# Patient Record
Sex: Male | Born: 1965 | Race: White | Hispanic: No | Marital: Single | Smoking: Current every day smoker
Health system: Southern US, Community
[De-identification: ages and names within clinical notes are randomized; demographics above are authoritative.]

## PROBLEM LIST (undated history)

## (undated) ENCOUNTER — Emergency Department (HOSPITAL_COMMUNITY): Admission: EM | Payer: Medicare Other | Source: Home / Self Care

## (undated) DIAGNOSIS — M5136 Other intervertebral disc degeneration, lumbar region: Secondary | ICD-10-CM

## (undated) DIAGNOSIS — R569 Unspecified convulsions: Secondary | ICD-10-CM

## (undated) DIAGNOSIS — J45909 Unspecified asthma, uncomplicated: Secondary | ICD-10-CM

## (undated) DIAGNOSIS — R519 Headache, unspecified: Secondary | ICD-10-CM

## (undated) DIAGNOSIS — IMO0001 Reserved for inherently not codable concepts without codable children: Secondary | ICD-10-CM

## (undated) DIAGNOSIS — Z9889 Other specified postprocedural states: Secondary | ICD-10-CM

## (undated) DIAGNOSIS — J449 Chronic obstructive pulmonary disease, unspecified: Secondary | ICD-10-CM

## (undated) DIAGNOSIS — M1990 Unspecified osteoarthritis, unspecified site: Secondary | ICD-10-CM

## (undated) DIAGNOSIS — I10 Essential (primary) hypertension: Secondary | ICD-10-CM

## (undated) DIAGNOSIS — T8859XA Other complications of anesthesia, initial encounter: Secondary | ICD-10-CM

## (undated) DIAGNOSIS — J309 Allergic rhinitis, unspecified: Secondary | ICD-10-CM

## (undated) DIAGNOSIS — K219 Gastro-esophageal reflux disease without esophagitis: Secondary | ICD-10-CM

## (undated) DIAGNOSIS — G4733 Obstructive sleep apnea (adult) (pediatric): Secondary | ICD-10-CM

## (undated) DIAGNOSIS — E7800 Pure hypercholesterolemia, unspecified: Secondary | ICD-10-CM

## (undated) DIAGNOSIS — J189 Pneumonia, unspecified organism: Secondary | ICD-10-CM

## (undated) DIAGNOSIS — T4145XA Adverse effect of unspecified anesthetic, initial encounter: Secondary | ICD-10-CM

## (undated) DIAGNOSIS — I219 Acute myocardial infarction, unspecified: Secondary | ICD-10-CM

## (undated) DIAGNOSIS — C801 Malignant (primary) neoplasm, unspecified: Secondary | ICD-10-CM

## (undated) DIAGNOSIS — R112 Nausea with vomiting, unspecified: Secondary | ICD-10-CM

## (undated) DIAGNOSIS — M51369 Other intervertebral disc degeneration, lumbar region without mention of lumbar back pain or lower extremity pain: Secondary | ICD-10-CM

## (undated) HISTORY — PX: HERNIA REPAIR: SHX51

## (undated) HISTORY — PX: SHOULDER ARTHROSCOPY: SHX128

## (undated) HISTORY — DX: Headache, unspecified: R51.9

## (undated) HISTORY — DX: Obstructive sleep apnea (adult) (pediatric): G47.33

## (undated) HISTORY — DX: Unspecified asthma, uncomplicated: J45.909

## (undated) HISTORY — DX: Chronic obstructive pulmonary disease, unspecified: J44.9

## (undated) HISTORY — DX: Allergic rhinitis, unspecified: J30.9

---
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
## 1984-12-12 HISTORY — PX: TOTAL KNEE ARTHROPLASTY: SHX125

## 2000-05-01 ENCOUNTER — Emergency Department (HOSPITAL_COMMUNITY): Admission: EM | Admit: 2000-05-01 | Discharge: 2000-05-01 | Payer: Self-pay | Admitting: Emergency Medicine

## 2000-05-01 ENCOUNTER — Encounter: Payer: Self-pay | Admitting: *Deleted

## 2009-12-24 ENCOUNTER — Encounter: Admission: RE | Admit: 2009-12-24 | Discharge: 2009-12-24 | Payer: Self-pay | Admitting: Neurosurgery

## 2010-03-05 ENCOUNTER — Encounter: Admission: RE | Admit: 2010-03-05 | Discharge: 2010-03-05 | Payer: Self-pay | Admitting: Diagnostic Neuroimaging

## 2010-07-01 ENCOUNTER — Encounter: Admission: RE | Admit: 2010-07-01 | Discharge: 2010-07-01 | Payer: Self-pay | Admitting: Neurosurgery

## 2010-07-08 ENCOUNTER — Ambulatory Visit: Payer: Self-pay | Admitting: Pain Medicine

## 2010-07-14 ENCOUNTER — Ambulatory Visit: Payer: Self-pay | Admitting: Pain Medicine

## 2014-12-16 ENCOUNTER — Ambulatory Visit (HOSPITAL_COMMUNITY): Payer: Medicare Other | Attending: Emergency Medicine

## 2014-12-16 ENCOUNTER — Emergency Department (INDEPENDENT_AMBULATORY_CARE_PROVIDER_SITE_OTHER)
Admission: EM | Admit: 2014-12-16 | Discharge: 2014-12-16 | Disposition: A | Discharge status: Home / Self Care | Payer: Medicare Other | Source: Home / Self Care | Attending: Emergency Medicine | Admitting: Emergency Medicine

## 2014-12-16 ENCOUNTER — Encounter (HOSPITAL_COMMUNITY): Payer: Self-pay | Admitting: Emergency Medicine

## 2014-12-16 DIAGNOSIS — M25512 Pain in left shoulder: Secondary | ICD-10-CM | POA: Diagnosis not present

## 2014-12-16 DIAGNOSIS — S46012A Strain of muscle(s) and tendon(s) of the rotator cuff of left shoulder, initial encounter: Secondary | ICD-10-CM

## 2014-12-16 HISTORY — DX: Essential (primary) hypertension: I10

## 2014-12-16 HISTORY — DX: Pure hypercholesterolemia, unspecified: E78.00

## 2014-12-16 HISTORY — DX: Gastro-esophageal reflux disease without esophagitis: K21.9

## 2014-12-16 MED ORDER — MELOXICAM 15 MG PO TABS: 15.0000 mg | ORAL_TABLET | Freq: Every day | ORAL | Status: DC

## 2014-12-16 MED ORDER — HYDROCODONE-ACETAMINOPHEN 5-325 MG PO TABS
ORAL_TABLET | ORAL | Status: AC
  Filled 2014-12-16: qty 2

## 2014-12-16 MED ORDER — OXYCODONE-ACETAMINOPHEN 5-325 MG PO TABS: ORAL_TABLET | ORAL | Status: DC

## 2014-12-16 MED ORDER — HYDROCODONE-ACETAMINOPHEN 5-325 MG PO TABS
2.0000 | ORAL_TABLET | Freq: Once | ORAL | Status: AC
  Administered 2014-12-16: 2 via ORAL
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
## 2014-12-16 NOTE — ED Notes (Signed)
Patient transported to X-ray by shuttle with [facility] staff driving.  Pt.'s son going with him. Olivia Mackie in [place] notified that pt. is coming and to call when he is ready to come back.
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
## 2014-12-16 NOTE — Discharge Instructions (Signed)
Rotator Cuff Injury Rotator cuff injury is any type of injury to the set of muscles and tendons that make up the stabilizing unit of your shoulder. This unit holds the ball of your upper arm bone (humerus) in the socket of your shoulder blade (scapula).  CAUSES Injuries to your rotator cuff most commonly come from sports or activities that cause your arm to be moved repeatedly over your head. Examples of this include throwing, weight lifting, swimming, or racquet sports. Long lasting (chronic) irritation of your rotator cuff can cause soreness and swelling (inflammation), bursitis, and eventual damage to your tendons, such as a tear (rupture). SIGNS AND SYMPTOMS Acute rotator cuff tear:  Sudden tearing sensation followed by severe pain shooting from your upper shoulder down your arm toward your elbow.  Decreased range of motion of your shoulder because of pain and muscle spasm.  Severe pain.  Inability to raise your arm out to the side because of pain and loss of muscle power (large tears). Chronic rotator cuff tear:  Pain that usually is worse at night and Jakubek interfere with sleep.  Gradual weakness and decreased shoulder motion as the pain worsens.  Decreased range of motion. Rotator cuff tendinitis:  Deep ache in your shoulder and the outside upper arm over your shoulder.  Pain that comes on gradually and becomes worse when lifting your arm to the side or turning it inward. DIAGNOSIS Rotator cuff injury is diagnosed through a medical history, physical exam, and imaging exam. The medical history helps determine the type of rotator cuff injury. Your health care provider will look at your injured shoulder, feel the injured area, and ask you to move your shoulder in different positions. X-ray exams typically are done to rule out other causes of shoulder pain, such as fractures. MRI is the exam of choice for the most severe shoulder injuries because the images show muscles and tendons.    TREATMENT  Chronic tear:  Medicine for pain, such as acetaminophen or ibuprofen.  Physical therapy and range-of-motion exercises Monarch be helpful in maintaining shoulder function and strength.  Steroid injections into your shoulder joint.  Surgical repair of the rotator cuff if the injury does not heal with noninvasive treatment. Acute tear:  Anti-inflammatory medicines such as ibuprofen and naproxen to help reduce pain and swelling.  A sling to help support your arm and rest your rotator cuff muscles. Long-term use of a sling is not advised. It Bouley cause significant stiffening of the shoulder joint.  Surgery Azzarello be considered within a few weeks, especially in younger, active people, to return the shoulder to full function.  Indications for surgical treatment include the following:  Age younger than 60 years.  Rotator cuff tears that are complete.  Physical therapy, rest, and anti-inflammatory medicines have been used for 6-8 weeks, with no improvement.  Employment or sporting activity that requires constant shoulder use. Tendinitis:  Anti-inflammatory medicines such as ibuprofen and naproxen to help reduce pain and swelling.  A sling to help support your arm and rest your rotator cuff muscles. Long-term use of a sling is not advised. It Lawley cause significant stiffening of the shoulder joint.  Severe tendinitis Gaus require:  Steroid injections into your shoulder joint.  Physical therapy.  Surgery. HOME CARE INSTRUCTIONS   Apply ice to your injury:  Put ice in a plastic bag.  Place a towel between your skin and the bag.  Leave the ice on for 20 minutes, 2-3 times a day.  If you   have a shoulder immobilizer (sling and straps), wear it until told otherwise by your health care provider.  You Mccolm want to sleep on several pillows or in a recliner at night to lessen swelling and pain.  Only take over-the-counter or prescription medicines for pain, discomfort, or fever as  directed by your health care provider.  Do simple hand squeezing exercises with a soft rubber ball to decrease hand swelling. SEEK MEDICAL CARE IF:   Your shoulder pain increases, or new pain or numbness develops in your arm, hand, or fingers.  Your hand or fingers are colder than your other hand. SEEK IMMEDIATE MEDICAL CARE IF:   Your arm, hand, or fingers are numb or tingling.  Your arm, hand, or fingers are increasingly swollen and painful, or they turn white or blue. MAKE SURE YOU:  Understand these instructions.  Will watch your condition.  Will get help right away if you are not doing well or get worse. Document Released: 11/25/2000 Document Revised: 12/03/2013 Document Reviewed: 07/10/2013 ExitCare Patient Information 2015 [employer]. This information is not intended to replace advice given to you by your health care provider. Make sure you discuss any questions you have with your health care provider.  

## 2014-12-16 NOTE — ED Notes (Signed)
Return from xray and back in room 9.

## 2014-12-23 ENCOUNTER — Other Ambulatory Visit: Payer: Self-pay | Admitting: Orthopaedic Surgery

## 2014-12-23 DIAGNOSIS — M25512 Pain in left shoulder: Secondary | ICD-10-CM

## 2014-12-24 ENCOUNTER — Ambulatory Visit
Admission: RE | Admit: 2014-12-24 | Discharge: 2014-12-24 | Disposition: A | Discharge status: Home / Self Care | Payer: Medicare Other | Source: Ambulatory Visit | Attending: Orthopaedic Surgery | Admitting: Orthopaedic Surgery

## 2014-12-24 DIAGNOSIS — M25512 Pain in left shoulder: Secondary | ICD-10-CM

## 2015-02-19 ENCOUNTER — Other Ambulatory Visit (HOSPITAL_COMMUNITY): Payer: Self-pay | Admitting: Orthopaedic Surgery

## 2015-02-20 ENCOUNTER — Encounter (HOSPITAL_COMMUNITY): Payer: Self-pay | Admitting: *Deleted

## 2015-02-22 MED ORDER — CEFAZOLIN SODIUM-DEXTROSE 2-3 GM-% IV SOLR
2.0000 g | INTRAVENOUS | Status: AC
  Administered 2015-02-23: 2 g via INTRAVENOUS
  Filled 2015-02-22: qty 50

## 2015-02-23 ENCOUNTER — Ambulatory Visit (HOSPITAL_COMMUNITY): Payer: Medicare Other | Admitting: Certified Registered Nurse Anesthetist

## 2015-02-23 ENCOUNTER — Encounter (HOSPITAL_COMMUNITY)
Admission: RE | Disposition: A | Discharge status: Home / Self Care | Payer: Self-pay | Source: Ambulatory Visit | Attending: Orthopaedic Surgery

## 2015-02-23 ENCOUNTER — Other Ambulatory Visit: Payer: Self-pay

## 2015-02-23 ENCOUNTER — Encounter (HOSPITAL_COMMUNITY): Payer: Self-pay | Admitting: Certified Registered Nurse Anesthetist

## 2015-02-23 ENCOUNTER — Ambulatory Visit (HOSPITAL_COMMUNITY)
Admission: RE | Admit: 2015-02-23 | Discharge: 2015-02-23 | Disposition: A | Discharge status: Home / Self Care | Payer: Medicare Other | Source: Ambulatory Visit | Attending: Orthopaedic Surgery | Admitting: Orthopaedic Surgery

## 2015-02-23 DIAGNOSIS — M5136 Other intervertebral disc degeneration, lumbar region: Secondary | ICD-10-CM | POA: Insufficient documentation

## 2015-02-23 DIAGNOSIS — I10 Essential (primary) hypertension: Secondary | ICD-10-CM | POA: Insufficient documentation

## 2015-02-23 DIAGNOSIS — K219 Gastro-esophageal reflux disease without esophagitis: Secondary | ICD-10-CM | POA: Insufficient documentation

## 2015-02-23 DIAGNOSIS — F17210 Nicotine dependence, cigarettes, uncomplicated: Secondary | ICD-10-CM | POA: Insufficient documentation

## 2015-02-23 DIAGNOSIS — M75122 Complete rotator cuff tear or rupture of left shoulder, not specified as traumatic: Secondary | ICD-10-CM | POA: Insufficient documentation

## 2015-02-23 HISTORY — DX: Nausea with vomiting, unspecified: R11.2

## 2015-02-23 HISTORY — DX: Other intervertebral disc degeneration, lumbar region without mention of lumbar back pain or lower extremity pain: M51.369

## 2015-02-23 HISTORY — DX: Reserved for inherently not codable concepts without codable children: IMO0001

## 2015-02-23 HISTORY — DX: Unspecified convulsions: R56.9

## 2015-02-23 HISTORY — DX: Other specified postprocedural states: Z98.890

## 2015-02-23 HISTORY — DX: Other complications of anesthesia, initial encounter: T88.59XA

## 2015-02-23 HISTORY — DX: Unspecified osteoarthritis, unspecified site: M19.90

## 2015-02-23 HISTORY — DX: Adverse effect of unspecified anesthetic, initial encounter: T41.45XA

## 2015-02-23 HISTORY — PX: SHOULDER ARTHROSCOPY WITH ROTATOR CUFF REPAIR: SHX5685

## 2015-02-23 HISTORY — DX: Other intervertebral disc degeneration, lumbar region: M51.36

## 2015-02-23 LAB — PROTIME-INR
INR: 1.08 (ref 0.00–1.49)
Prothrombin Time: 14.1 seconds (ref 11.6–15.2)

## 2015-02-23 LAB — CBC
HCT: 45 % (ref 39.0–52.0)
Hemoglobin: 15.4 g/dL (ref 13.0–17.0)
MCH: 29.4 pg (ref 26.0–34.0)
MCHC: 34.2 g/dL (ref 30.0–36.0)
MCV: 86 fL (ref 78.0–100.0)
Platelets: 205 10*3/uL (ref 150–400)
RBC: 5.23 MIL/uL (ref 4.22–5.81)
RDW: 13.5 % (ref 11.5–15.5)
WBC: 7.8 10*3/uL (ref 4.0–10.5)

## 2015-02-23 LAB — COMPREHENSIVE METABOLIC PANEL
ALT: 29 U/L (ref 0–53)
ANION GAP: 9 (ref 5–15)
AST: 27 U/L (ref 0–37)
Albumin: 3.7 g/dL (ref 3.5–5.2)
Alkaline Phosphatase: 75 U/L (ref 39–117)
BUN: 25 mg/dL — AB (ref 6–23)
CALCIUM: 9.1 mg/dL (ref 8.4–10.5)
CO2: 23 mmol/L (ref 19–32)
CREATININE: 1.33 mg/dL (ref 0.50–1.35)
Chloride: 106 mmol/L (ref 96–112)
GFR calc Af Amer: 71 mL/min — ABNORMAL LOW (ref >90)
GFR calc non Af Amer: 61 mL/min — ABNORMAL LOW (ref >90)
GLUCOSE: 100 mg/dL — AB (ref 70–99)
POTASSIUM: 3.3 mmol/L — AB (ref 3.5–5.1)
Sodium: 138 mmol/L (ref 135–145)
TOTAL PROTEIN: 6.4 g/dL (ref 6.0–8.3)
Total Bilirubin: 0.8 mg/dL (ref 0.3–1.2)

## 2015-02-23 SURGERY — ARTHROSCOPY, SHOULDER, WITH ROTATOR CUFF REPAIR
Anesthesia: General | Laterality: Left

## 2015-02-23 MED ORDER — PHENYLEPHRINE 40 MCG/ML (10ML) SYRINGE FOR IV PUSH (FOR BLOOD PRESSURE SUPPORT)
PREFILLED_SYRINGE | INTRAVENOUS | Status: AC
  Filled 2015-02-23: qty 10

## 2015-02-23 MED ORDER — NEOSTIGMINE METHYLSULFATE 10 MG/10ML IV SOLN
INTRAVENOUS | Status: DC | PRN
  Administered 2015-02-23: 4 mg via INTRAVENOUS

## 2015-02-23 MED ORDER — ARTIFICIAL TEARS OP OINT
TOPICAL_OINTMENT | OPHTHALMIC | Status: AC
  Filled 2015-02-23: qty 3.5

## 2015-02-23 MED ORDER — PHENYLEPHRINE HCL 10 MG/ML IJ SOLN
10.0000 mg | INTRAVENOUS | Status: DC | PRN
  Administered 2015-02-23: 50 ug/min via INTRAVENOUS

## 2015-02-23 MED ORDER — FENTANYL CITRATE 0.05 MG/ML IJ SOLN
100.0000 ug | Freq: Once | INTRAMUSCULAR | Status: AC
  Administered 2015-02-23: 100 ug via INTRAVENOUS
  Filled 2015-02-23: qty 2

## 2015-02-23 MED ORDER — EPHEDRINE SULFATE 50 MG/ML IJ SOLN
INTRAMUSCULAR | Status: DC | PRN
  Administered 2015-02-23 (×3): 5 mg via INTRAVENOUS

## 2015-02-23 MED ORDER — LIDOCAINE HCL (CARDIAC) 20 MG/ML IV SOLN
INTRAVENOUS | Status: DC | PRN
  Administered 2015-02-23: 100 mg via INTRAVENOUS

## 2015-02-23 MED ORDER — LACTATED RINGERS IV SOLN
INTRAVENOUS | Status: DC
  Administered 2015-02-23: 10:00:00 via INTRAVENOUS

## 2015-02-23 MED ORDER — DEXAMETHASONE SODIUM PHOSPHATE 4 MG/ML IJ SOLN
INTRAMUSCULAR | Status: AC
Start: 2015-02-23 — End: 2015-02-23
  Filled 2015-02-23: qty 2

## 2015-02-23 MED ORDER — MIDAZOLAM HCL 2 MG/2ML IJ SOLN
2.0000 mg | Freq: Once | INTRAMUSCULAR | Status: AC
  Administered 2015-02-23: 2 mg via INTRAVENOUS

## 2015-02-23 MED ORDER — OXYCODONE HCL 5 MG PO TABS: 5.0000 mg | ORAL_TABLET | Freq: Once | ORAL | Status: AC | PRN

## 2015-02-23 MED ORDER — BUPIVACAINE-EPINEPHRINE (PF) 0.25% -1:200000 IJ SOLN
INTRAMUSCULAR | Status: AC
  Filled 2015-02-23: qty 30

## 2015-02-23 MED ORDER — PROPOFOL 10 MG/ML IV BOLUS
INTRAVENOUS | Status: AC
  Filled 2015-02-23: qty 20

## 2015-02-23 MED ORDER — ARTIFICIAL TEARS OP OINT
TOPICAL_OINTMENT | OPHTHALMIC | Status: DC | PRN
  Administered 2015-02-23: 1 via OPHTHALMIC

## 2015-02-23 MED ORDER — ONDANSETRON HCL 4 MG/2ML IJ SOLN
INTRAMUSCULAR | Status: DC | PRN
  Administered 2015-02-23: 4 mg via INTRAVENOUS

## 2015-02-23 MED ORDER — METHOCARBAMOL 500 MG PO TABS: 500.0000 mg | ORAL_TABLET | Freq: Four times a day (QID) | ORAL | Status: DC | PRN

## 2015-02-23 MED ORDER — SODIUM CHLORIDE 0.9 % IR SOLN
Status: DC | PRN
  Administered 2015-02-23: 3000 mL

## 2015-02-23 MED ORDER — BUPIVACAINE-EPINEPHRINE (PF) 0.5% -1:200000 IJ SOLN
INTRAMUSCULAR | Status: DC | PRN
  Administered 2015-02-23: 30 mL via PERINEURAL

## 2015-02-23 MED ORDER — PHENYLEPHRINE HCL 10 MG/ML IJ SOLN
INTRAMUSCULAR | Status: DC | PRN
  Administered 2015-02-23 (×2): 120 ug via INTRAVENOUS
  Administered 2015-02-23: 160 ug via INTRAVENOUS

## 2015-02-23 MED ORDER — PROPOFOL 10 MG/ML IV BOLUS
INTRAVENOUS | Status: DC | PRN
  Administered 2015-02-23: 200 mg via INTRAVENOUS

## 2015-02-23 MED ORDER — LACTATED RINGERS IV SOLN
INTRAVENOUS | Status: DC | PRN
  Administered 2015-02-23 (×2): via INTRAVENOUS

## 2015-02-23 MED ORDER — SUCCINYLCHOLINE CHLORIDE 20 MG/ML IJ SOLN
INTRAMUSCULAR | Status: DC | PRN
  Administered 2015-02-23: 140 mg via INTRAVENOUS

## 2015-02-23 MED ORDER — HYDROMORPHONE HCL 1 MG/ML IJ SOLN
0.2500 mg | INTRAMUSCULAR | Status: DC | PRN
Start: 2015-02-23 — End: 2015-02-24

## 2015-02-23 MED ORDER — NEOSTIGMINE METHYLSULFATE 10 MG/10ML IV SOLN
INTRAVENOUS | Status: AC
  Filled 2015-02-23: qty 1

## 2015-02-23 MED ORDER — FENTANYL CITRATE 0.05 MG/ML IJ SOLN
INTRAMUSCULAR | Status: AC
  Filled 2015-02-23: qty 5

## 2015-02-23 MED ORDER — GLYCOPYRROLATE 0.2 MG/ML IJ SOLN
INTRAMUSCULAR | Status: DC | PRN
  Administered 2015-02-23: 0.6 mg via INTRAVENOUS

## 2015-02-23 MED ORDER — OXYCODONE HCL 5 MG/5ML PO SOLN: 5.0000 mg | Freq: Once | ORAL | Status: AC | PRN

## 2015-02-23 MED ORDER — OXYCODONE-ACETAMINOPHEN 7.5-325 MG PO TABS: 1.0000 | ORAL_TABLET | Freq: Four times a day (QID) | ORAL | Status: DC | PRN

## 2015-02-23 MED ORDER — MIDAZOLAM HCL 2 MG/2ML IJ SOLN
INTRAMUSCULAR | Status: AC
  Administered 2015-02-23: 2 mg via INTRAVENOUS
  Filled 2015-02-23: qty 2

## 2015-02-23 MED ORDER — ONDANSETRON HCL 4 MG/2ML IJ SOLN
INTRAMUSCULAR | Status: AC
  Filled 2015-02-23: qty 2

## 2015-02-23 MED ORDER — MIDAZOLAM HCL 2 MG/2ML IJ SOLN
INTRAMUSCULAR | Status: AC
  Filled 2015-02-23: qty 2

## 2015-02-23 MED ORDER — ROCURONIUM BROMIDE 100 MG/10ML IV SOLN
INTRAVENOUS | Status: DC | PRN
  Administered 2015-02-23: 40 mg via INTRAVENOUS

## 2015-02-23 MED ORDER — LIDOCAINE HCL (CARDIAC) 20 MG/ML IV SOLN
INTRAVENOUS | Status: AC
  Filled 2015-02-23: qty 5

## 2015-02-23 MED ORDER — ROCURONIUM BROMIDE 50 MG/5ML IV SOLN
INTRAVENOUS | Status: AC
  Filled 2015-02-23: qty 1

## 2015-02-23 MED ORDER — GLYCOPYRROLATE 0.2 MG/ML IJ SOLN
INTRAMUSCULAR | Status: AC
  Filled 2015-02-23: qty 2

## 2015-02-23 MED ORDER — PROMETHAZINE HCL 25 MG/ML IJ SOLN: 6.2500 mg | INTRAMUSCULAR | Status: DC | PRN

## 2015-02-23 MED ORDER — SUCCINYLCHOLINE CHLORIDE 20 MG/ML IJ SOLN
INTRAMUSCULAR | Status: AC
  Filled 2015-02-23: qty 1

## 2015-02-23 MED ORDER — BUPIVACAINE-EPINEPHRINE 0.25% -1:200000 IJ SOLN
INTRAMUSCULAR | Status: DC | PRN
  Administered 2015-02-23: 16 mL

## 2015-02-23 SURGICAL SUPPLY — 62 items
APL SKNCLS STERI-STRIP NONHPOA (GAUZE/BANDAGES/DRESSINGS) ×1
BENZOIN TINCTURE PRP APPL 2/3 (GAUZE/BANDAGES/DRESSINGS) ×2 IMPLANT
BLADE CUDA 5.5 (BLADE) IMPLANT
BLADE CUTTER GATOR 3.5 (BLADE) IMPLANT
BLADE GREAT WHITE 4.2 (BLADE) ×2 IMPLANT
BLADE SURG 11 STRL SS (BLADE) ×2 IMPLANT
BUR OVAL 6.0 (BURR) ×2 IMPLANT
CANNULA ACUFLEX KIT 5X76 (CANNULA) ×4 IMPLANT
COVER SURGICAL LIGHT HANDLE (MISCELLANEOUS) ×2 IMPLANT
DRAPE INCISE IOBAN 66X45 STRL (DRAPES) ×4 IMPLANT
DRAPE STERI 35X30 U-POUCH (DRAPES) ×2 IMPLANT
DRAPE U-SHAPE 47X51 STRL (DRAPES) ×4 IMPLANT
DRSG EMULSION OIL 3X3 NADH (GAUZE/BANDAGES/DRESSINGS) ×1 IMPLANT
DRSG PAD ABDOMINAL 8X10 ST (GAUZE/BANDAGES/DRESSINGS) ×6 IMPLANT
DURAPREP 26ML APPLICATOR (WOUND CARE) ×2 IMPLANT
ELECT MENISCUS 165MM 90D (ELECTRODE) IMPLANT
ELECT REM PT RETURN 9FT ADLT (ELECTROSURGICAL) ×2
ELECTRODE REM PT RTRN 9FT ADLT (ELECTROSURGICAL) ×1 IMPLANT
FILTER STRAW FLUID ASPIR (MISCELLANEOUS) ×1 IMPLANT
GAUZE SPONGE 4X4 12PLY STRL (GAUZE/BANDAGES/DRESSINGS) ×2 IMPLANT
GAUZE XEROFORM 1X8 LF (GAUZE/BANDAGES/DRESSINGS) ×2 IMPLANT
GAUZE XEROFORM 5X9 LF (GAUZE/BANDAGES/DRESSINGS) ×2 IMPLANT
GLOVE BIOGEL PI IND STRL 8 (GLOVE) ×1 IMPLANT
GLOVE BIOGEL PI INDICATOR 8 (GLOVE) ×1
GLOVE ORTHO TXT STRL SZ7.5 (GLOVE) ×2 IMPLANT
GOWN STRL REUS W/ TWL LRG LVL3 (GOWN DISPOSABLE) ×3 IMPLANT
GOWN STRL REUS W/TWL LRG LVL3 (GOWN DISPOSABLE) ×6
IMMOBILIZER SHOULDER FOAM XLGE (SOFTGOODS) ×2 IMPLANT
KIT BASIN OR (CUSTOM PROCEDURE TRAY) ×2 IMPLANT
KIT BIO-TENODESIS 3X8 DISP (MISCELLANEOUS) ×2
KIT INSRT BABSR STRL DISP BTN (MISCELLANEOUS) IMPLANT
KIT ROOM TURNOVER OR (KITS) ×2 IMPLANT
MANIFOLD NEPTUNE II (INSTRUMENTS) ×2 IMPLANT
NDL HYPO 25X1 1.5 SAFETY (NEEDLE) ×1 IMPLANT
NDL SUT 6 .5 CRC .975X.05 MAYO (NEEDLE) ×1 IMPLANT
NEEDLE HYPO 25X1 1.5 SAFETY (NEEDLE) ×2 IMPLANT
NEEDLE MAYO TAPER (NEEDLE) ×2
NEEDLE SPNL 18GX3.5 QUINCKE PK (NEEDLE) ×2 IMPLANT
NS IRRIG 1000ML POUR BTL (IV SOLUTION) ×2 IMPLANT
PACK SHOULDER (CUSTOM PROCEDURE TRAY) ×2 IMPLANT
PAD ARMBOARD 7.5X6 YLW CONV (MISCELLANEOUS) ×4 IMPLANT
SCREW TENODESIS BIOCOMP 7MM (Screw) ×1 IMPLANT
SET ARTHROSCOPY TUBING (MISCELLANEOUS) ×2
SET ARTHROSCOPY TUBING LN (MISCELLANEOUS) ×1 IMPLANT
SLING ARM IMMOBILIZER MED (SOFTGOODS) IMPLANT
SPONGE GAUZE 4X4 12PLY STER LF (GAUZE/BANDAGES/DRESSINGS) ×2 IMPLANT
SPONGE LAP 4X18 X RAY DECT (DISPOSABLE) ×4 IMPLANT
STRIP CLOSURE SKIN 1/2X4 (GAUZE/BANDAGES/DRESSINGS) ×2 IMPLANT
SUCTION FRAZIER TIP 10 FR DISP (SUCTIONS) ×2 IMPLANT
SUT ANCHOR ULTRAFIX RC (Orthopedic Implant) ×2 IMPLANT
SUT ETHILON 3 0 PS 1 (SUTURE) ×2 IMPLANT
SUT VIC AB 0 CT2 27 (SUTURE) ×4 IMPLANT
SUT VIC AB 2-0 CT1 27 (SUTURE) ×4
SUT VIC AB 2-0 CT1 TAPERPNT 27 (SUTURE) ×2 IMPLANT
SUT VICRYL 4-0 PS2 18IN ABS (SUTURE) ×4 IMPLANT
SYR 20CC LL (SYRINGE) ×4 IMPLANT
SYR 3ML LL SCALE MARK (SYRINGE) ×2 IMPLANT
SYR TB 1ML LUER SLIP (SYRINGE) ×2 IMPLANT
TOWEL OR 17X24 6PK STRL BLUE (TOWEL DISPOSABLE) ×2 IMPLANT
TOWEL OR 17X26 10 PK STRL BLUE (TOWEL DISPOSABLE) ×2 IMPLANT
WAND HAND CNTRL MULTIVAC 90 (MISCELLANEOUS) IMPLANT
WATER STERILE IRR 1000ML POUR (IV SOLUTION) ×2 IMPLANT

## 2015-02-23 NOTE — Brief Op Note (Signed)
02/23/2015  1:01 PM  PATIENT:  Lawrence Medina  48 y.o. male  PRE-OPERATIVE DIAGNOSIS:  Rotator Cuff Tear Left Shoulder  POST-OPERATIVE DIAGNOSIS:  Rotator Cuff Tear Left Shoulder  PROCEDURE:  Procedure(s): SHOULDER ARTHROSCOPY WITH ROTATOR CUFF REPAIR (Left) and biceps tenodesis  SURGEON:  Surgeon(s) and Role:    * Marybelle Killings, MD - Primary  PHYSICIAN ASSISTANT:   ASSISTANTS: none   ANESTHESIA:   general  EBL:  Total I/O In: 1000 [I.V.:1000] Out: -   BLOOD ADMINISTERED:none  DRAINS: none  SPECIMEN:  No Specimen  DISPOSITION OF SPECIMEN:  N/A  COUNTS:  YES  TOURNIQUET:  * No tourniquets in log *   PATIENT DISPOSITION:  PACU - hemodynamically stable.

## 2015-02-23 NOTE — Anesthesia Preprocedure Evaluation (Addendum)
Anesthesia Evaluation  Patient identified by MRN, date of birth, ID band Patient awake    History of Anesthesia Complications (+) PONV  Airway Mallampati: III   Neck ROM: Limited  Mouth opening: Limited Mouth Opening  Dental  (+) Teeth Intact   Pulmonary shortness of breath, Current Smoker,  breath sounds clear to auscultation  + decreased breath sounds      Cardiovascular hypertension, Rhythm:Regular Rate:Normal     Neuro/Psych Seizures -,     GI/Hepatic GERD-  ,  Endo/Other  Morbid obesity  Renal/GU      Musculoskeletal  (+) Arthritis -,   Abdominal (+) + obese,   Peds  Hematology   Anesthesia Other Findings   Reproductive/Obstetrics                            Anesthesia Physical Anesthesia Plan  ASA: III  Anesthesia Plan: General   Post-op Pain Management:    Induction: Intravenous  Airway Management Planned: Oral ETT and Video Laryngoscope Planned  Additional Equipment:   Intra-op Plan:   Post-operative Plan: Extubation in OR  Informed Consent: I have reviewed the patients History and Physical, chart, labs and discussed the procedure including the risks, benefits and alternatives for the proposed anesthesia with the patient or authorized representative who has indicated his/her understanding and acceptance.   Dental advisory given  Plan Discussed with:   Anesthesia Plan Comments:        Anesthesia Quick Evaluation

## 2015-02-23 NOTE — Transfer of Care (Signed)
Immediate Anesthesia Transfer of Care Note  Patient: Lawrence Medina  Procedure(s) Performed: Procedure(s): SHOULDER ARTHROSCOPY WITH ROTATOR CUFF REPAIR BIOTENDONESIS REPAIR (Left)  Patient Location: PACU  Anesthesia Type:General and Regional  Level of Consciousness: awake, alert , oriented, patient cooperative and responds to stimulation  Airway & Oxygen Therapy: Patient Spontanous Breathing and Patient connected to nasal cannula oxygen  Post-op Assessment: Report given to RN and Post -op Vital signs reviewed and stable  Post vital signs: Reviewed and stable  Last Vitals:  Filed Vitals:   02/23/15 0841  BP: 144/83  Pulse: 92  Temp: 37.2 C  Resp: 18    Complications: No apparent anesthesia complications

## 2015-02-23 NOTE — Interval H&P Note (Signed)
History and Physical Interval Note:  02/23/2015 11:00 AM  Lawrence Medina  has presented today for surgery, with the diagnosis of Rotator Cuff Tear Left Shoulder  The various methods of treatment have been discussed with the patient and family. After consideration of risks, benefits and other options for treatment, the patient has consented to  Procedure(s): SHOULDER ARTHROSCOPY WITH ROTATOR CUFF REPAIR (Left) as a surgical intervention .  The patient's history has been reviewed, patient examined, no change in status, stable for surgery.  I have reviewed the patient's chart and labs.  Questions were answered to the patient's satisfaction.     Avangeline Stockburger C

## 2015-02-23 NOTE — Anesthesia Procedure Notes (Signed)
Anesthesia Regional Block:  Interscalene brachial plexus block  Pre-Anesthetic Checklist: ,, timeout performed, Correct Patient, Correct Site, Correct Laterality, Correct Procedure, Correct Position, site marked, Risks and benefits discussed, Surgical consent,  At surgeon's request and post-op pain management  Laterality: Upper and Left  Prep: chloraprep and alcohol swabs       Needles:  Injection technique: Single-shot  Needle Type: Echogenic Needle     Needle Length: 9cm 9 cm Needle Gauge: 21 and 21 G  Needle insertion depth: 4 cm   Additional Needles:  Procedures: ultrasound guided (picture in chart) and nerve stimulator Interscalene brachial plexus block  Nerve Stimulator or Paresthesia:  Response: Twitch elicited, 0.5 mA, 0.3 ms,   Additional Responses:   Narrative:  Start time: 02/23/2015 11:05 AM End time: 02/23/2015 11:20 AM Injection made incrementally with aspirations every 5 mL.  Performed by: Personally  Anesthesiologist: Sakeena Teall  Additional Notes: Block assessed prior to start of surgery

## 2015-02-23 NOTE — Anesthesia Postprocedure Evaluation (Signed)
2 Anesthesia Post-op Note  Patient: Lawrence Medina  Procedure(s) Performed: Procedure(s): SHOULDER ARTHROSCOPY WITH ROTATOR CUFF REPAIR BIOTENDONESIS REPAIR (Left)  Patient Location: PACU  Anesthesia Type:GA combined with regional for post-op pain  Level of Consciousness: awake and alert   Airway and Oxygen Therapy: Patient Spontanous Breathing  Post-op Pain: none  Post-op Assessment: Post-op Vital signs reviewed  Post-op Vital Signs: stable  Last Vitals:  Filed Vitals:   02/23/15 1414  BP:   Pulse:   Temp: 36 C  Resp:     Complications: No apparent anesthesia complications

## 2015-02-23 NOTE — Discharge Instructions (Signed)
°What to eat: ° °For your first meals, you should eat lightly; only small meals initially.  If you do not have nausea, you Boike eat larger meals.  Avoid spicy, greasy and heavy food.   ° °General Anesthesia, Adult, Care After  °Refer to this sheet in the next few weeks. These instructions provide you with information on caring for yourself after your procedure. Your health care provider Kreisler also give you more specific instructions. Your treatment has been planned according to current medical practices, but problems sometimes occur. Call your health care provider if you have any problems or questions after your procedure.  °WHAT TO EXPECT AFTER THE PROCEDURE  °After the procedure, it is typical to experience:  °Sleepiness.  °Nausea and vomiting. °HOME CARE INSTRUCTIONS  °For the first 24 hours after general anesthesia:  °Have a responsible person with you.  °Do not drive a car. If you are alone, do not take public transportation.  °Do not drink alcohol.  °Do not take medicine that has not been prescribed by your health care provider.  °Do not sign important papers or make important decisions.  °You Hopwood resume a normal diet and activities as directed by your health care provider.  °Change bandages (dressings) as directed.  °If you have questions or problems that seem related to general anesthesia, call the hospital and ask for the anesthetist or anesthesiologist on call. °SEEK MEDICAL CARE IF:  °You have nausea and vomiting that continue the day after anesthesia.  °You develop a rash. °SEEK IMMEDIATE MEDICAL CARE IF:  °You have difficulty breathing.  °You have chest pain.  °You have any allergic problems. °Document Released: 03/06/2001 Document Revised: 07/31/2013 Document Reviewed: 06/13/2013  °ExitCare® Patient Information ©2014 [employer].  ° °Sore Throat  ° ° °A sore throat is a painful, burning, sore, or scratchy feeling of the throat. There Kimmey be pain or tenderness when swallowing or talking. You Vohra have  other symptoms with a sore throat. These include coughing, sneezing, fever, or a swollen neck. A sore throat is often the first sign of another sickness. These sicknesses Bhat include a cold, flu, strep throat, or an infection called mono. Most sore throats go away without medical treatment.  °HOME CARE  °Only take medicine as told by your doctor.  °Drink enough fluids to keep your pee (urine) clear or pale yellow.  °Rest as needed.  °Try using throat sprays, lozenges, or suck on hard candy (if older than 4 years or as told).  °Sip warm liquids, such as broth, herbal tea, or warm water with honey. Try sucking on frozen ice pops or drinking cold liquids.  °Rinse the mouth (gargle) with salt water. Mix 1 teaspoon salt with 8 ounces of water.  °Do not smoke. Avoid being around others when they are smoking.  °Put a humidifier in your bedroom at night to moisten the air. You can also turn on a hot shower and sit in the bathroom for 5-10 minutes. Be sure the bathroom door is closed. °GET HELP RIGHT AWAY IF:  °You have trouble breathing.  °You cannot swallow fluids, soft foods, or your spit (saliva).  °You have more puffiness (swelling) in the throat.  °Your sore throat does not get better in 7 days.  °You feel sick to your stomach (nauseous) and throw up (vomit).  °You have a fever or lasting symptoms for more than 2-3 days.  °You have a fever and your symptoms suddenly get worse. °MAKE SURE YOU:  °Understand these   instructions.  °Will watch your condition.  °Will get help right away if you are not doing well or get worse. °Document Released: 09/06/2008 Document Revised: 08/22/2012 Document Reviewed: 08/05/2012  °ExitCare® Patient Information ©2015 [employer]. This information is not intended to replace advice given to you by your health care provider. Make sure you discuss any questions you have with your health care provider.  ° ° ° °
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
## 2015-02-24 ENCOUNTER — Encounter (HOSPITAL_COMMUNITY): Payer: Self-pay | Admitting: Orthopaedic Surgery

## 2015-02-24 NOTE — Op Note (Signed)
NAME:  Lawrence Medina, Lawrence Medina NO.:  [id number]  MEDICAL RECORD NO.:  [id number]  LOCATION:  [facility]                         FACILITY:  [facility]  PHYSICIAN:  Lawrence Medina, M.D.    DATE OF BIRTH:  1966-07-14  DATE OF PROCEDURE:  02/23/2015 DATE OF DISCHARGE:                              OPERATIVE REPORT   PREOPERATIVE DIAGNOSIS:  Left shoulder rotator cuff tear.  POSTOPERATIVE DIAGNOSIS:  Type 2 superior labral tear and full thickness rotator cuff tear involving the supraspinatus, minimally retracted.  PROCEDURE:  Diagnostic operative arthroscopy, left shoulder labral debridement, rotator cuff debridement, biceps tendon debridement and release.  Open biceps tenodesis in the bicipital groove with 7 x 23 Bio- Tenodesis screw.  Full-thickness rotator cuff repair, UltraFix anchors x2.  SURGEON:  Lawrence Medina, M.D.  ANESTHESIA:  General with preoperative block and 10 mL Marcaine 6.97% local.  COMPLICATIONS:  None.  ESTIMATED BLOOD LOSS:  Minimal.  DESCRIPTION OF PROCEDURE:  After induction of preoperative block anesthesia with intubation with underlying GlideScope by Anesthesia due to his thick short neck.  Once the tube is secured, standard positioning with beach chair on the shoulder frame, 10/15 drape DuraPrep, Ancef prophylaxis, time-out procedure, split sheets, drapes, impervious stockinette, Coban, and arthroscopic pouch were applied __________ placed through the scope, posterior approach.  Anterior port was made inferior to the biceps tendon.  Anterior inferior labrum was intact.  No Hill-Sachs lesion.  No Bankart lesion.  Articular surface looked normal. Superior labral tear from 11 to 2 o'clock position, lifting up the labrum down to surface of torn tissue where it had been attached to the glenoid and was visualized.  There was tendinopathy at the anchor of the biceps as it attached to the labrum and this was divided with straight flat baskets and a 4.2 Cuda  shaver.  Labrum was debrided.  Under surface of the rotator cuff was debrided.  __________ biceps tendon, there was supraspinatus full-thickness tear.  Under surface of the rotator cuff was debrided.  Shoulder was suctioned dry.  Incision was made along the anterior aspect of the acromion.  The patient had type 3 large spur greater than a centimeter and this was removed with 3/4 straight osteotome and rongeur.  Distal aspect of the clavicle was not prominent. Subacromial bursectomy was performed.  Full-thickness tear was visualized.  Biceps was hooked with elbow flexed, shoulder flexed with right angle.  Biceps tendon was resected 22 mm and then #2 FiberWire was placed __________ way through the tendon.  Hole drilled on the bicipital groove, reamed to 7 mm, and then insertion of the tendon using the long handle of the screwdriver attachment into the hole with the screw following it and then tying the 2 ends locking it in.  Tendon went into the hole through the bone and the screw was countersunk.  Next, attention was turned to the rotator cuff, cut back to 3 mm  __________ bleeding cuff.  Bone was rongeured and then 2 UltraFix anchors were placed and then sutured back through the rotator cuff supraspinatus tendon, pulling it directly down to the freshened up bony surface. Shoulder was rotated.  No further tear.  Undersurface of the acromion was smoothed.  Deltoid was repaired with the UltraFix needles back through holes in the bone with the deltoid 2-0 Vicryl in the subcutaneous tissue, 4-0 Vicryl subcuticular closure, tincture of benzoin, Steri- Strips, postop dressing, and a shoulder immobilizer.  Outpatient surgery for appropriate treatment of this condition.  Office followup in 1 week.     Lawrence Medina, M.D.     MCY/MEDQ  D:  02/23/2015  T:  02/24/2015  Job:  [id number]

## 2017-06-02 ENCOUNTER — Telehealth (INDEPENDENT_AMBULATORY_CARE_PROVIDER_SITE_OTHER): Payer: Self-pay | Admitting: Orthopaedic Surgery

## 2017-06-02 NOTE — Telephone Encounter (Signed)
Atty Alphonse Guild calling to check the status of his request to meet with Dr Lorin Mercy in person or vis phone, please advise

## 2017-06-05 NOTE — Telephone Encounter (Signed)
I'm putting email in your office regarding this note.

## 2017-06-05 NOTE — Telephone Encounter (Signed)
Would you like him to call prior to clinic on Tuesday or Friday afternoon?

## 2017-06-05 NOTE — Telephone Encounter (Signed)
OK either . Thanks .

## 2017-06-06 NOTE — Telephone Encounter (Signed)
Friday after noon OK .

## 2017-06-06 NOTE — Telephone Encounter (Signed)
Can you please let attorney know?

## 2017-06-07 NOTE — Telephone Encounter (Signed)
Fyi.

## 2017-06-07 NOTE — Telephone Encounter (Signed)
Phone Conference scheduled for 06/09/17 at 1:15 pm for Dr. Lorin Mercy with [place] at [facility].

## 2017-06-09 NOTE — Telephone Encounter (Signed)
See below

## 2017-06-12 NOTE — Telephone Encounter (Signed)
Conference call done less than one hour.

## 2017-06-27 ENCOUNTER — Emergency Department (HOSPITAL_BASED_OUTPATIENT_CLINIC_OR_DEPARTMENT_OTHER): Admit: 2017-06-27 | Discharge: 2017-06-27 | Disposition: A | Discharge status: Home / Self Care | Payer: Medicare Other

## 2017-06-27 ENCOUNTER — Encounter (HOSPITAL_COMMUNITY): Payer: Self-pay

## 2017-06-27 ENCOUNTER — Other Ambulatory Visit: Payer: Self-pay

## 2017-06-27 ENCOUNTER — Emergency Department (HOSPITAL_COMMUNITY): Payer: Medicare Other

## 2017-06-27 ENCOUNTER — Emergency Department (HOSPITAL_COMMUNITY)
Admission: EM | Admit: 2017-06-27 | Discharge: 2017-06-27 | Disposition: A | Discharge status: Home / Self Care | Payer: Medicare Other | Attending: Emergency Medicine | Admitting: Emergency Medicine

## 2017-06-27 DIAGNOSIS — F17210 Nicotine dependence, cigarettes, uncomplicated: Secondary | ICD-10-CM | POA: Insufficient documentation

## 2017-06-27 DIAGNOSIS — I714 Abdominal aortic aneurysm, without rupture, unspecified: Secondary | ICD-10-CM

## 2017-06-27 DIAGNOSIS — M7989 Other specified soft tissue disorders: Secondary | ICD-10-CM

## 2017-06-27 DIAGNOSIS — I10 Essential (primary) hypertension: Secondary | ICD-10-CM | POA: Diagnosis not present

## 2017-06-27 DIAGNOSIS — R079 Chest pain, unspecified: Secondary | ICD-10-CM | POA: Diagnosis present

## 2017-06-27 DIAGNOSIS — R0789 Other chest pain: Secondary | ICD-10-CM

## 2017-06-27 DIAGNOSIS — Z79899 Other long term (current) drug therapy: Secondary | ICD-10-CM | POA: Insufficient documentation

## 2017-06-27 LAB — CK: Total CK: 163 U/L (ref 49–397)

## 2017-06-27 LAB — BASIC METABOLIC PANEL
Anion gap: 7 (ref 5–15)
BUN: 14 mg/dL (ref 6–20)
CO2: 23 mmol/L (ref 22–32)
Calcium: 9.1 mg/dL (ref 8.9–10.3)
Chloride: 106 mmol/L (ref 101–111)
Creatinine, Ser: 1.02 mg/dL (ref 0.61–1.24)
GFR calc Af Amer: >60 mL/min (ref >60)
GFR calc non Af Amer: >60 mL/min (ref >60)
GLUCOSE: 94 mg/dL (ref 65–99)
Potassium: 3.9 mmol/L (ref 3.5–5.1)
Sodium: 136 mmol/L (ref 135–145)

## 2017-06-27 LAB — URINALYSIS, ROUTINE W REFLEX MICROSCOPIC
Bilirubin Urine: NEGATIVE
GLUCOSE, UA: NEGATIVE mg/dL
Ketones, ur: 5 mg/dL — AB
LEUKOCYTES UA: NEGATIVE
NITRITE: NEGATIVE
PH: 5 (ref 5.0–8.0)
Protein, ur: NEGATIVE mg/dL
SPECIFIC GRAVITY, URINE: 1.027 (ref 1.005–1.030)

## 2017-06-27 LAB — LIPASE, BLOOD: Lipase: 37 U/L (ref 11–51)

## 2017-06-27 LAB — CBC
HEMATOCRIT: 47.5 % (ref 39.0–52.0)
Hemoglobin: 16.5 g/dL (ref 13.0–17.0)
MCH: 30 pg (ref 26.0–34.0)
MCHC: 34.7 g/dL (ref 30.0–36.0)
MCV: 86.4 fL (ref 78.0–100.0)
Platelets: 213 10*3/uL (ref 150–400)
RBC: 5.5 MIL/uL (ref 4.22–5.81)
RDW: 14.3 % (ref 11.5–15.5)
WBC: 8.1 10*3/uL (ref 4.0–10.5)

## 2017-06-27 LAB — I-STAT TROPONIN, ED
TROPONIN I, POC: 0.06 ng/mL (ref 0.00–0.08)
Troponin i, poc: 0.02 ng/mL (ref 0.00–0.08)
Troponin i, poc: 0 ng/mL (ref 0.00–0.08)

## 2017-06-27 LAB — HEPATIC FUNCTION PANEL
ALT: 21 U/L (ref 17–63)
AST: 23 U/L (ref 15–41)
Albumin: 3.8 g/dL (ref 3.5–5.0)
Alkaline Phosphatase: 98 U/L (ref 38–126)
Bilirubin, Direct: 0.2 mg/dL (ref 0.1–0.5)
Indirect Bilirubin: 0.6 mg/dL (ref 0.3–0.9)
Total Bilirubin: 0.8 mg/dL (ref 0.3–1.2)
Total Protein: 6.6 g/dL (ref 6.5–8.1)

## 2017-06-27 MED ORDER — METOPROLOL TARTRATE 5 MG/5ML IV SOLN
5.0000 mg | Freq: Once | INTRAVENOUS | Status: AC
  Administered 2017-06-27: 5 mg via INTRAVENOUS
  Filled 2017-06-27: qty 5

## 2017-06-27 MED ORDER — IOPAMIDOL (ISOVUE-370) INJECTION 76%
INTRAVENOUS | Status: AC
  Administered 2017-06-27: 100 mL
  Filled 2017-06-27: qty 100

## 2017-06-27 MED ORDER — AMLODIPINE BESYLATE 10 MG PO TABS: 10.0000 mg | ORAL_TABLET | Freq: Every day | ORAL | 0 refills | Status: DC

## 2017-06-27 NOTE — ED Provider Notes (Signed)
[facility] DEPT Provider Note   CSN: [id number] Arrival date & time: 06/27/17  1339     History   Chief Complaint Chief Complaint  Patient presents with  . Chest Pain    HPI Lawrence Medina is a 51 y.o. male history of reflux, hypertension, here presenting with chest pain, shortness of breath, right flank pain. Patient has been having right flank pain for the last 2 weeks. Patient also has right groin pain yesterday. The last 2 days he noticed some substernal chest pain that is worse when he palpates. There is associated shortness of breath as well. Denies any radiation to the pain. Also since yesterday, he noticed some more tenderness on his left biceps muscle as well as left calf and some calf swelling. Patient was at the mall yesterday and had sudden onset R groin pain and had to stop and sit down. He went to see PCP today who wanted to call EMS to get him to the ER but he drove himself to the ED. He was noted to be hypertensive in triage and told me that he took lisinopril today. Denies recent travel, denies hx of CAD.   The history is provided by the patient.    Past Medical History:  Diagnosis Date  . Arthritis   . Complication of anesthesia   . DDD (degenerative disc disease), lumbar   . GERD (gastroesophageal reflux disease)   . High cholesterol   . Hypertension   . PONV (postoperative nausea and vomiting)   . Seizures ([place]) 1982   x1 in 1982- unknown cause- none since  . Shortness of breath dyspnea    with exertion    There are no active problems to display for this patient.   Past Surgical History:  Procedure Laterality Date  . HERNIA REPAIR    . SHOULDER ARTHROSCOPY WITH ROTATOR CUFF REPAIR Left 02/23/2015   Procedure: SHOULDER ARTHROSCOPY WITH ROTATOR CUFF REPAIR BIOTENDONESIS REPAIR;  Surgeon: Marybelle Killings, MD;  Location: [facility];  Service: Orthopedics;  Laterality: Left;  . SUBMANDIBULAR GLAND EXCISION Left 1975  . TOTAL KNEE ARTHROPLASTY Left 1986        Home Medications    Prior to Admission medications   Medication Sig Start Date End Date Taking? Authorizing Provider  amLODipine (NORVASC) 5 MG tablet Take 5 mg by mouth daily. 04/10/17  Yes [provider]  clonazePAM (KLONOPIN) 1 MG tablet Take 1 mg by mouth at bedtime as needed. 05/22/17  Yes [provider]  lisinopril-hydrochlorothiazide (PRINZIDE,ZESTORETIC) 20-25 MG tablet Take 1 tablet by mouth daily. 04/10/17  Yes [provider]  lovastatin (MEVACOR) 20 MG tablet Take 20 mg by mouth daily.   Yes [provider]  omeprazole (PRILOSEC) 40 MG capsule Take 40 mg by mouth daily.   Yes [provider]  meloxicam (MOBIC) 15 MG tablet Take 1 tablet (15 mg total) by mouth daily. Patient not taking: Reported on 06/27/2017 12/16/14   Harden Mo, MD  methocarbamol (ROBAXIN) 500 MG tablet Take 1 tablet (500 mg total) by mouth every 6 (six) hours as needed for muscle spasms. Patient not taking: Reported on 06/27/2017 02/23/15   Lanae Crumbly, PA-C  oxyCODONE-acetaminophen (PERCOCET) 7.5-325 MG per tablet Take 1 tablet by mouth every 6 (six) hours as needed for pain. Patient not taking: Reported on 06/27/2017 02/23/15   Lanae Crumbly, PA-C    Family History Family History  Problem Relation Age of Onset  . Hypertension Mother   .  Cancer Mother        skin  . Sleep apnea Mother   . Cancer Father        lung    Social History Social History  Substance Use Topics  . Smoking status: Current Every Day Smoker    Packs/day: 0.50    Years: 34.00    Types: Cigarettes  . Smokeless tobacco: Never Used  . Alcohol use Yes     Comment: very seldom     Allergies   Patient has no known allergies.   Review of Systems Review of Systems  Cardiovascular: Positive for chest pain.  Genitourinary:       R flank pain   All other systems reviewed and are negative.    Physical Exam Updated Vital Signs BP (!) 148/100   Pulse 80   Temp  98.1 F (36.7 C) (Oral)   Resp (!) 23   Ht 6' (1.829 m)   Wt (!) 140.6 kg (310 lb)   SpO2 95%   BMI 42.04 kg/m   Physical Exam  Constitutional: He is oriented to person, place, and time.  Uncomfortable   HENT:  Head: Normocephalic.  Mouth/Throat: Oropharynx is clear and moist.  Eyes: Pupils are equal, round, and reactive to light. EOM are normal.  Neck: Normal range of motion. Neck supple.  Cardiovascular: Normal rate, regular rhythm and normal heart sounds.   Pulmonary/Chest: Effort normal and breath sounds normal.  Reproducible sternal tenderness. Lungs clear   Abdominal: Soft. Bowel sounds are normal. He exhibits no distension. There is no tenderness.  + R CVAT. ? Small R inguinal hernia, reproducible, nontender   Genitourinary:  Genitourinary Comments: Testicles nontender.   Musculoskeletal: Normal range of motion.  + L calf tenderness, mild swelling. Mild L biceps tenderness no deformity   Neurological: He is alert and oriented to person, place, and time.  Skin: Skin is warm.  Psychiatric: He has a normal mood and affect.  Nursing note and vitals reviewed.    ED Treatments / Results  Labs (all labs ordered are listed, but only abnormal results are displayed) Labs Reviewed  URINALYSIS, ROUTINE W REFLEX MICROSCOPIC - Abnormal; Notable for the following:       Result Value   Hgb urine dipstick SMALL (*)    Ketones, ur 5 (*)    Bacteria, UA RARE (*)    Squamous Epithelial / LPF 0-5 (*)    All other components within normal limits  BASIC METABOLIC PANEL  CBC  HEPATIC FUNCTION PANEL  LIPASE, BLOOD  CK  I-STAT TROPOININ, ED  I-STAT TROPOININ, ED  I-STAT TROPOININ, ED    EKG  EKG Interpretation  Date/Time:  Tuesday June 27 2017 13:43:02 EDT Ventricular Rate:  88 PR Interval:  154 QRS Duration: 110 QT Interval:  376 QTC Calculation: 841 R Axis:   1 Text Interpretation:  Normal sinus rhythm Right bundle branch block Abnormal ECG No significant change since  last tracing Confirmed by Wandra Arthurs [id number]) on 06/27/2017 5:35:25 PM       Radiology Dg Chest 2 View  Result Date: 06/27/2017 CLINICAL DATA:  Chest pain extending into the left arm for several months. History of pneumothorax. Smoker. EXAM: CHEST  2 VIEW COMPARISON:  None. FINDINGS: The heart size and mediastinal contours are normal. There is mild central airway thickening without hyperinflation, confluent airspace opacity, pleural effusion or pneumothorax. Mild degenerative changes are present within the spine. There are postsurgical changes in the left humeral head. IMPRESSION: Mild  central airway thickening attributed to smoking. No acute cardiopulmonary process. Electronically Signed   By: Richardean Sale M.D.   On: 06/27/2017 14:59   Ct Angio Chest/abd/pel For Dissection W And/or Wo Contrast  Result Date: 06/27/2017 CLINICAL DATA:  Chest pain and abdominal pain. Assess for vascular dissection. EXAM: CT ANGIOGRAPHY CHEST, ABDOMEN AND PELVIS TECHNIQUE: Multidetector CT imaging through the chest, abdomen and pelvis was performed using the standard protocol during bolus administration of intravenous contrast. Multiplanar reconstructed images and MIPs were obtained and reviewed to evaluate the vascular anatomy. CONTRAST:  100 cc Isovue 370 COMPARISON:  Chest radiography same day FINDINGS: CTA CHEST FINDINGS Cardiovascular: Chest radiography same day heart size is normal. No pericardial fluid. No coronary artery calcification is seen. No evidence of aortic calcification. No aortic aneurysm or dissection. Brachiocephalic vessels appear normal. Pulmonary arteries appear normal. Mediastinum/Nodes: No mass or lymphadenopathy. Lungs/Pleura: Lungs are clear.  No pleural effusion. Musculoskeletal: Cervical spondylosis. No significant spinal finding in the thoracic region. Review of the MIP images confirms the above findings. CTA ABDOMEN AND PELVIS FINDINGS VASCULAR Aorta: Aortic atherosclerosis. 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 Maximal diameter is 2.9 cm. No dissection. Celiac: Widely patent and normal. SMA: Widely patent and normal. Renals: Widely patent and normal. IMA: Widely patent and normal. Inflow: Normal Veins: Normal Review of the MIP images confirms the above findings. NON-VASCULAR Hepatobiliary: Normal Pancreas: Normal Spleen: Normal Adrenals/Urinary Tract: Normal adrenal glands are normal. Kidneys are normal. Bladder is normal. Stomach/Bowel: Normal except for diverticulosis of the left colon without evidence of diverticulitis. Lymphatic: No mass or lymphadenopathy. Reproductive: Normal Other: No free fluid or air. Musculoskeletal: Degenerative disc disease in the lower lumbar spine. Review of the MIP images confirms the above findings. IMPRESSION: No aortic dissection or other acute vascular pathology. No coronary artery calcification. Pulmonary arteries appear normal. Lungs are clear. 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 Maximal diameter is 2.9 cm. Ectatic abdominal aorta at risk for aneurysm development. Recommend followup by ultrasound in 5 years. This recommendation follows ACR consensus guidelines: White Paper of the ACR Incidental Findings Committee II on Vascular Findings. J Am Coll Radiol 2013; 10:789-794. No evidence of organ pathology of an acute nature. Diverticulosis of the colon without evidence of diverticulitis. Electronically Signed   By: Nelson Chimes M.D.   On: 06/27/2017 19:13    Procedures Procedures (including critical care time)  Medications Ordered in ED Medications  metoprolol tartrate (LOPRESSOR) injection 5 mg (5 mg Intravenous Given 06/27/17 1903)  iopamidol (ISOVUE-370) 76 % injection (100 mLs  Contrast Given 06/27/17 1847)     Initial Impression / Assessment and Plan / ED Course  I have reviewed the triage vital signs and the nursing notes.  Pertinent labs & imaging  results that were available during my care of the patient were reviewed by me and considered in my medical decision making (see chart for details).     Lawrence Medina is a 51 y.o. male here with R flank pain, chest pain, hypertension, calf pain. Differential broad. Consider symptomatic hypertension vs hypertensive urgency vs pyelo vs renal colic vs DVT vs PE vs dissection vs pain from inguinal hernia. Will get labs, UA, CXR, trop x 2. Will get dissection study, DVT study.     8:15 PM DVT neg. 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 UA unremarkable. Trop was 0.02 then went up to 0.06  and down to 0.00. I doubt ACS. I think likely symptomatic hypertension. He is on lisinopril/HCTZ 20/25 mg and norvasc 5 mg. Will double norvasc to 10 mg daily and have him see PCP next week for BP check.   Final Clinical Impressions(s) / ED Diagnoses   Final diagnoses:  None    New Prescriptions New Prescriptions   No medications on file     Drenda Freeze, MD 06/27/17 2016

## 2017-06-27 NOTE — Progress Notes (Signed)
**  Preliminary report by tech**  Left lower extremity venous duplex complete. There is no evidence of deep or superficial vein thrombosis involving the left lower extremity. All visualized vessels appear patent and compressible. There is no evidence of a Baker's cyst on the left. Results were given to Dr. Darl Householder.  06/27/17 6:15 PM Lawrence Medina RVT

## 2017-06-27 NOTE — ED Triage Notes (Signed)
Per Pt, Pt is coming from PCP where he was evaluated for CP. Pt reports having left sided CP associated with some SOB for the past 1.5 weeks. Pt reports nausea with no vomiting. NO STEMI found with PCP, but patient opted to ride POV instead of EMS.

## 2017-06-27 NOTE — ED Notes (Signed)
ED Provider at bedside. 

## 2017-06-27 NOTE — ED Notes (Signed)
Vascular at bedside

## 2017-06-27 NOTE — Discharge Instructions (Signed)
Increase norvasc to 10 mg daily.   Continue lisinopril/HCTZ.   See your doctor next week to recheck blood pressure  Return to ER if you have worse chest pain, flank pain, abdominal pain, trouble breathing, groin pain.

## 2017-06-27 NOTE — ED Notes (Signed)
Pt provided with [place] sandwich and ginger ale, ok by EDP

## 2017-06-27 NOTE — ED Notes (Signed)
Patient transported to CT 

## 2017-09-27 ENCOUNTER — Telehealth (INDEPENDENT_AMBULATORY_CARE_PROVIDER_SITE_OTHER): Payer: Self-pay | Admitting: Radiology
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
## 2017-11-17 NOTE — Telephone Encounter (Signed)
Ria Comment with [employer] asked if there are any tentative dates in January or February that he can schedule a deposition with Dr Lorin Mercy for patient Lawrence Medina. I do not have the on call schedule to give him dates. I do have the clinic schedule for that Dr. Lorin Mercy will be in the office. January dates 4th,11th, 15th, 18th, 22nd, 25th, and 29th. February dates 19th, 22nd, and 26th.  Thanks, Joycelyn Schmid

## 2017-11-17 NOTE — Telephone Encounter (Signed)
Please advise on when you would like to schedule.

## 2017-11-22 NOTE — Telephone Encounter (Signed)
On a Friday at 5 PM is fine. Cheryl/Wendy  has schedule so knows which weekends I am off.

## 2017-11-22 NOTE — Telephone Encounter (Signed)
Can you please let me know when a good Friday is to schedule this deposition? Please see below. Thanks.

## 2017-11-30 NOTE — Telephone Encounter (Signed)
Let's do it on January 11th.  I blocked some time to hold. They also want to know what the fee is for deposition, and Tammy will know this.  I think Tammy schedules them as well?

## 2017-11-30 NOTE — Telephone Encounter (Signed)
Here is Deposition info

## 2017-11-30 NOTE — Telephone Encounter (Signed)
Fyi.  Please let me know if you need me to do anything further.

## 2017-12-15 ENCOUNTER — Telehealth (INDEPENDENT_AMBULATORY_CARE_PROVIDER_SITE_OTHER): Payer: Self-pay | Admitting: Orthopaedic Surgery

## 2017-12-15 NOTE — Telephone Encounter (Signed)
I scheduled this in epic.  Sending to you so you can do whatever is needed for this at this point.  Thanks.  Let me know if anything else is needed?

## 2017-12-15 NOTE — Telephone Encounter (Signed)
Spoke with Ria Comment with [employer] she advised will keep 12/22/17 for the Deposition with Dr Lorin Mercy. I advised will schedule the appointment deposition

## 2017-12-15 NOTE — Telephone Encounter (Signed)
Margaret, please see below

## 2017-12-19 NOTE — Telephone Encounter (Signed)
Sent e-mail to Ria Comment  at attorneys office to see if 01/12/18 will work for the deposition with Dr. Lorin Mercy waiting for response.

## 2017-12-20 NOTE — Telephone Encounter (Signed)
[facility] office kept 12/22/16 appt

## 2017-12-21 NOTE — Telephone Encounter (Signed)
noted 

## 2017-12-21 NOTE — Telephone Encounter (Signed)
Fyi.  Still on your schedule for 12/22/17.

## 2017-12-22 ENCOUNTER — Institutional Professional Consult (permissible substitution) (INDEPENDENT_AMBULATORY_CARE_PROVIDER_SITE_OTHER): Payer: Self-pay | Admitting: Orthopaedic Surgery

## 2018-01-11 NOTE — Telephone Encounter (Signed)
ERROR

## 2019-02-14 ENCOUNTER — Encounter: Payer: Self-pay | Admitting: Cardiology

## 2019-03-11 ENCOUNTER — Telehealth: Payer: Self-pay

## 2019-03-11 NOTE — Telephone Encounter (Signed)
-----   Message from Richardo Priest, MD sent at 03/09/2019 11:10 AM EDT ----- I phoned him as a new patient he is having a respiratory infection right now and he is having noncardiac sharp pleuritic chest pain.  We decided to cancel the office visit I told him the symptoms became typical angina worsened he could phone [name] in the office or at his request he said he would seek emergency room treatment.  Please cancel

## 2019-03-11 NOTE — Telephone Encounter (Signed)
Patient was advised by phone call with Dr Bettina Gavia to cancel the appointment for the time being, and he could contact our office at anytime with any worsening symptoms for an appointment.

## 2019-03-14 ENCOUNTER — Ambulatory Visit: Payer: Medicare Other | Admitting: Cardiology

## 2019-03-26 ENCOUNTER — Telehealth: Payer: Self-pay | Admitting: Cardiology

## 2019-03-26 NOTE — Telephone Encounter (Signed)
YOUR CARDIOLOGY TEAM HAS ARRANGED FOR AN E-VISIT FOR YOUR APPOINTMENT - PLEASE REVIEW IMPORTANT INFORMATION BELOW SEVERAL DAYS PRIOR TO YOUR APPOINTMENT  Due to the recent COVID-19 pandemic, we are transitioning in-person office visits to tele-medicine visits in an effort to decrease unnecessary exposure to our patients and staff. Medicare and most insurances are covering these visits without a copay needed. We also encourage you to sign up for MyChart if you have not already done so. You will need a smartphone if possible. For patients that do not have this, we can still complete the visit using a regular telephone but do prefer a smartphone to enable video when possible. You Hettich have a close family member that lives with you that can help. If possible, we also ask that you have a blood pressure cuff and scale at home to measure your blood pressure, heart rate and weight prior to your scheduled appointment. Patients with clinical needs that need an in-person evaluation and testing will still be able to come to the office if absolutely necessary. If you have any questions, feel free to call our office.    IF YOU HAVE A SMARTPHONE, PLEASE DOWNLOAD THE WEBEX APP TO YOUR SMARTPHONE (STAFF: Remove this message after reading - Please change instructions to alternative platform if provider using something other than WebEx. MyChart requires the app download while Doxy.me and Doximity do not.)  - If Apple, go to [employer] and type in WebEx in the search bar. [place] [employer], the blue/green circle. The app is free but as with any other app download, your phone [place] require you to verify saved payment information or Apple password. You do NOT have to create a WebEx account.  - If Android, go to [employer] and type in [employer]ner in the search bar. [place] [employer], the blue/green circle. The app is free but as with any other app download, your phone [place] require you to verify saved payment  information or Android password. You do NOT have to create a WebEx account.  It is very helpful to have this downloaded before your visit.    2-3 DAYS BEFORE YOUR APPOINTMENT  You will receive a telephone call from one of our [place] team members - your caller ID Noffsinger say "Unknown caller." If this is a video visit, we will confirm that you have been able to download any necessary apps prior to the visit. We will remind you check your blood pressure, heart rate and weight prior to your scheduled appointment. If you have an Apple Watch or Kardia, please upload any pertinent ECG strips the day before or morning of your appointment to [place]. Our staff will also make sure you have reviewed the consent and agree to move forward with your scheduled tele-health visit.     THE DAY OF YOUR APPOINTMENT  Approximately 15 minutes prior to your scheduled appointment, you will receive a telephone call from one of [place] team - your caller ID Kruszka say "Unknown caller."  Our staff will confirm medications, vital signs for the day and any symptoms you Trevizo be experiencing. Please have this information available prior to the time of visit start. It [place] also be helpful for you to have a pad of paper and pen handy for any instructions given during your visit. They will also walk you through joining the smartphone meeting if this is a video visit.    CONSENT FOR TELE-HEALTH VISIT - PLEASE REVIEW  I hereby voluntarily request,  consent and authorize [facility] and its employed or contracted physicians, physician assistants, nurse practitioners or other licensed health care professionals (the Practitioner), to provide me with telemedicine health care services (the Services") as deemed necessary by the treating Practitioner. I acknowledge and consent to receive the Services by the Practitioner via telemedicine. I understand that the telemedicine visit will involve communicating with the Practitioner through live  audiovisual communication technology and the disclosure of certain medical information by electronic transmission. I acknowledge that I have been given the opportunity to request an in-person assessment or other available alternative prior to the telemedicine visit and am voluntarily participating in the telemedicine visit.  I understand that I have the right to withhold or withdraw my consent to the use of telemedicine in the course of my care at any time, without affecting my right to future care or treatment, and that the Practitioner or I Monjaras terminate the telemedicine visit at any time. I understand that I have the right to inspect all information obtained and/or recorded in the course of the telemedicine visit and Mcgriff receive copies of available information for a reasonable fee.  I understand that some of the potential risks of receiving the Services via telemedicine include:   Delay or interruption in medical evaluation due to technological equipment failure or disruption;  Information transmitted Brawn not be sufficient (e.g. poor resolution of images) to allow for appropriate medical decision making by the Practitioner; and/or   In rare instances, security protocols could fail, causing a breach of personal health information.  Furthermore, I acknowledge that it is my responsibility to provide information about my medical history, conditions and care that is complete and accurate to the best of my ability. I acknowledge that Practitioner's advice, recommendations, and/or decision Caldera be based on factors not within their control, such as incomplete or inaccurate data provided by me or distortions of diagnostic images or specimens that Loney result from electronic transmissions. I understand that the practice of medicine is not an exact science and that Practitioner makes no warranties or guarantees regarding treatment outcomes. I acknowledge that I will receive a copy of this consent concurrently upon  execution via email to the email address I last provided but Diosdado also request a printed copy by calling the office of [place].    I understand that my insurance will be billed for this visit.   I have read or had this consent read to me.  I understand the contents of this consent, which adequately explains the benefits and risks of the Services being provided via telemedicine.   I have been provided ample opportunity to ask questions regarding this consent and the Services and have had my questions answered to my satisfaction.  I give my informed consent for the services to be provided through the use of telemedicine in my medical care  By participating in this telemedicine visit I agree to the above.  PATIENT gives verbal consent for televisit/pp

## 2019-03-28 DIAGNOSIS — I700 Atherosclerosis of aorta: Secondary | ICD-10-CM | POA: Insufficient documentation

## 2019-03-28 DIAGNOSIS — I714 Abdominal aortic aneurysm, without rupture, unspecified: Secondary | ICD-10-CM | POA: Insufficient documentation

## 2019-03-28 DIAGNOSIS — I110 Hypertensive heart disease with heart failure: Secondary | ICD-10-CM | POA: Insufficient documentation

## 2019-03-28 DIAGNOSIS — I10 Essential (primary) hypertension: Principal | ICD-10-CM

## 2019-03-28 DIAGNOSIS — E785 Hyperlipidemia, unspecified: Secondary | ICD-10-CM | POA: Insufficient documentation

## 2019-03-29 ENCOUNTER — Telehealth: Payer: Medicare Other | Admitting: Cardiology

## 2019-03-29 DIAGNOSIS — I10 Essential (primary) hypertension: Secondary | ICD-10-CM

## 2019-03-29 DIAGNOSIS — E782 Mixed hyperlipidemia: Secondary | ICD-10-CM

## 2019-03-29 DIAGNOSIS — I700 Atherosclerosis of aorta: Secondary | ICD-10-CM

## 2019-03-29 DIAGNOSIS — I714 Abdominal aortic aneurysm, without rupture, unspecified: Secondary | ICD-10-CM

## 2019-04-17 ENCOUNTER — Telehealth: Payer: Self-pay

## 2019-04-17 ENCOUNTER — Telehealth: Payer: Self-pay | Admitting: Cardiology

## 2019-04-17 NOTE — Telephone Encounter (Signed)
Virtual Visit Pre-Appointment Phone Call  "(Name), I am calling you today to discuss your upcoming appointment. We are currently trying to limit exposure to the virus that causes COVID-19 by seeing patients at home rather than in the office."  1. "What is the BEST phone number to call the day of the visit?" - include this in appointment notes  2. Do you have or have access to (through a family member/friend) a smartphone with video capability that we can use for your visit?" a. If yes - list this number in appt notes as cell (if different from BEST phone #) and list the appointment type as a VIDEO visit in appointment notes b. If no - list the appointment type as a PHONE visit in appointment notes  3. Confirm consent - "In the setting of the current Covid19 crisis, you are scheduled for a (phone or video) visit with your provider on (date) at (time).  Just as we do with many in-office visits, in order for you to participate in this visit, we must obtain consent.  If you'd like, I can send this to your mychart (if signed up) or email for you to review.  Otherwise, I can obtain your verbal consent now.  All virtual visits are billed to your insurance company just like a normal visit would be.  By agreeing to a virtual visit, we'd like you to understand that the technology does not allow for your provider to perform an examination, and thus Barto limit your provider's ability to fully assess your condition. If your provider identifies any concerns that need to be evaluated in person, we will make arrangements to do so.  Finally, though the technology is pretty good, we cannot assure that it will always work on either your or our end, and in the setting of a video visit, we Blanda have to convert it to a phone-only visit.  In either situation, we cannot ensure that we have a secure connection.  Are you willing to proceed?" STAFF: Did the patient verbally acknowledge consent to telehealth visit? Document  YES/NO here: Yes  4. Advise patient to be prepared - "Two hours prior to your appointment, go ahead and check your blood pressure, pulse, oxygen saturation, and your weight (if you have the equipment to check those) and write them all down. When your visit starts, your provider will ask you for this information. If you have an Apple Watch or Kardia device, please plan to have heart rate information ready on the day of your appointment. Please have a pen and paper handy nearby the day of the visit as well."  5. Give patient instructions for MyChart download to smartphone OR Doximity/Doxy.me as below if video visit (depending on what platform provider is using)  6. Inform patient they will receive a phone call 15 minutes prior to their appointment time (Buch be from unknown caller ID) so they should be prepared to answer    TELEPHONE CALL NOTE  Yandell Mcjunkins Schinke has been deemed a candidate for a follow-up tele-health visit to limit community exposure during the Covid-19 pandemic. I spoke with the patient via phone to ensure availability of phone/video source, confirm preferred email & phone number, and discuss instructions and expectations.  I reminded Linh Johannes Ing to be prepared with any vital sign and/or heart rhythm information that could potentially be obtained via home monitoring, at the time of his visit. I reminded Clayborne Divis Enzor to expect a phone call prior to  his visit.  Calla Kicks 04/17/2019 11:51 AM   INSTRUCTIONS FOR DOWNLOADING THE MYCHART APP TO SMARTPHONE  - The patient must first make sure to have activated MyChart and know their login information - If Apple, go to [employer] and type in MyChart in the search bar and download the app. If Android, ask patient to go to [employer] and type in Advance in the search bar and download the app. The app is free but as with any other app downloads, their phone Blaker require them to verify saved payment information or Apple/Android password.    - The patient will need to then log into the app with their MyChart username and password, and select [place] as their healthcare provider to link the account. When it is time for your visit, go to the MyChart app, find appointments, and click Begin Video Visit. Be sure to Select Allow for your device to access the Microphone and Camera for your visit. You will then be connected, and your provider will be with you shortly.  **If they have any issues connecting, or need assistance please contact MyChart service desk [phone])**  **If using a computer, in order to ensure the best quality for their visit they will need to use either of the following Internet Browsers: [employer], or Google Chrome**  IF USING DOXIMITY or DOXY.ME - The patient will receive a link just prior to their visit by text.     FULL LENGTH CONSENT FOR TELE-HEALTH VISIT   I hereby voluntarily request, consent and authorize [facility] [place] and its employed or contracted physicians, physician assistants, nurse practitioners or other licensed health care professionals (the Practitioner), to provide me with telemedicine health care services (the Services") as deemed necessary by the treating Practitioner. I acknowledge and consent to receive the Services by the Practitioner via telemedicine. I understand that the telemedicine visit will involve communicating with the Practitioner through live audiovisual communication technology and the disclosure of certain medical information by electronic transmission. I acknowledge that I have been given the opportunity to request an in-person assessment or other available alternative prior to the telemedicine visit and am voluntarily participating in the telemedicine visit.  I understand that I have the right to withhold or withdraw my consent to the use of telemedicine in the course of my care at any time, without affecting my right to future care or treatment, and that  the Practitioner or I Rafuse terminate the telemedicine visit at any time. I understand that I have the right to inspect all information obtained and/or recorded in the course of the telemedicine visit and Schnetzer receive copies of available information for a reasonable fee.  I understand that some of the potential risks of receiving the Services via telemedicine include:   Delay or interruption in medical evaluation due to technological equipment failure or disruption;  Information transmitted Lybarger not be sufficient (e.g. poor resolution of images) to allow for appropriate medical decision making by the Practitioner; and/or   In rare instances, security protocols could fail, causing a breach of personal health information.  Furthermore, I acknowledge that it is my responsibility to provide information about my medical history, conditions and care that is complete and accurate to the best of my ability. I acknowledge that Practitioner's advice, recommendations, and/or decision Nakajima be based on factors not within their control, such as incomplete or inaccurate data provided by me or distortions of diagnostic images or specimens that Radermacher result from electronic transmissions.  I understand that the practice of medicine is not an exact science and that Practitioner makes no warranties or guarantees regarding treatment outcomes. I acknowledge that I will receive a copy of this consent concurrently upon execution via email to the email address I last provided but Schwager also request a printed copy by calling the office of [place].    I understand that my insurance will be billed for this visit.   I have read or had this consent read to me.  I understand the contents of this consent, which adequately explains the benefits and risks of the Services being provided via telemedicine.   I have been provided ample opportunity to ask questions regarding this consent and the Services and have had my questions answered to  my satisfaction.  I give my informed consent for the services to be provided through the use of telemedicine in my medical care  By participating in this telemedicine visit I agree to the above.

## 2019-04-17 NOTE — Telephone Encounter (Signed)
Patient was contacted by Nevin Bloodgood to reschedule a virtual visit that he missed.  Patient was scheduled for 04-30-19 with Dr Bettina Gavia but he did mention some arm pain and palpitations.  Call was transferred to me.   Patient describes pain in left shoulder and down into his arm, but he feels like this is arthritis related.  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  He states that he did have an episode this morning while sitting in his chair.  He described it as his "heart skipping." He states that he does have a BP monitor at home, and his heart rate has been running in the 80's. He did not check his BP or heart rate this morning.   Patient was offered a virtual visit for today, but he could not do it as he has to work.  He was also offered a virtual visit for 5-7 and 5-8, but he declined as he has to work.  I explained to the patient that he could be seen by another physician other than Dr Bettina Gavia next week if her would like.  He did not wish to move appointment with one of our other cardiologist.  He states that he will go to ED if he feels the symptoms that he has experienced get worse or fail to improve.   Patient advised if he has any chest pain, shortness of breath, or frequent palpitations with an elevated heart rate to proceed to nearest ED.  Patient agreed to plan and verbalized understanding.

## 2019-04-29 ENCOUNTER — Telehealth: Payer: Self-pay | Admitting: Cardiology

## 2019-04-29 NOTE — Progress Notes (Deleted)
Cardiology Office Note:    Date:  04/29/2019   ID:  Lawrence Medina, DOB 05-Feb-1966, MRN [id number]  PCP:  Leonard Downing, MD  Cardiologist:  Shirlee More, MD   Referring MD: Leonard Downing, *  ASSESSMENT:    No diagnosis found. PLAN:    In order of problems listed above:  1. ***  Next appointment   Medication Adjustments/Labs and Tests Ordered: Current medicines are reviewed at length with the patient today.  Concerns regarding medicines are outlined above.  No orders of the defined types were placed in this encounter.  No orders of the defined types were placed in this encounter.    No chief complaint on file. ***  History of Present Illness:    Lawrence Medina is a 53 y.o. male who is being seen today for the evaluation of chest pain at the request of Leonard Downing, *   EKG performed in his primary care physician's office 02/14/2019 shows sinus rhythm baseline artifact right bundle branch block no acute ischemic changes.   Past Medical History:  Diagnosis Date  . Arthritis   . Complication of anesthesia   . DDD (degenerative disc disease), lumbar   . GERD (gastroesophageal reflux disease)   . High cholesterol   . Hypertension   . PONV (postoperative nausea and vomiting)   . Seizures ([place]) 1982   x1 in 1982- unknown cause- none since  . Shortness of breath dyspnea    with exertion    Past Surgical History:  Procedure Laterality Date  . HERNIA REPAIR    . SHOULDER ARTHROSCOPY WITH ROTATOR CUFF REPAIR Left 02/23/2015   Procedure: SHOULDER ARTHROSCOPY WITH ROTATOR CUFF REPAIR BIOTENDONESIS REPAIR;  Surgeon: Marybelle Killings, MD;  Location: [facility];  Service: Orthopedics;  Laterality: Left;  . SUBMANDIBULAR GLAND EXCISION Left 1975  . TOTAL KNEE ARTHROPLASTY Left 1986    Current Medications: No outpatient medications have been marked as taking for the 04/30/19 encounter (Appointment) with Richardo Priest, MD.     Allergies:   Patient has no  known allergies.   Social History   Socioeconomic History  . Marital status: Single    Spouse name: Not on file  . Number of children: Not on file  . Years of education: Not on file  . Highest education level: Not on file  Occupational History  . Not on file  Social Needs  . Financial resource strain: Not on file  . Food insecurity:    Worry: Not on file    Inability: Not on file  . Transportation needs:    Medical: Not on file    Non-medical: Not on file  Tobacco Use  . Smoking status: Current Every Day Smoker    Packs/day: 0.50    Years: 34.00    Pack years: 17.00    Types: Cigarettes  . Smokeless tobacco: Never Used  Substance and Sexual Activity  . Alcohol use: Yes    Comment: very seldom  . Drug use: Yes    Types: Marijuana  . Sexual activity: Not on file  Lifestyle  . Physical activity:    Days per week: Not on file    Minutes per session: Not on file  . Stress: Not on file  Relationships  . Social connections:    Talks on phone: Not on file    Gets together: Not on file    Attends religious service: Not on file    Active member of club or  organization: Not on file    Attends meetings of clubs or organizations: Not on file    Relationship status: Not on file  Other Topics Concern  . Not on file  Social History Narrative  . Not on file     Family History: The patient's ***family history includes Cancer in his father and mother; Hypertension in his mother; Sleep apnea in his mother.  ROS:   ROS Please see the history of present illness.    *** All other systems reviewed and are negative.  EKGs/Labs/Other Studies Reviewed:    The following studies were reviewed today: ***  EKG:  EKG is *** ordered today.  The ekg ordered today is personally reviewed and demonstrates ***  Recent Labs: No results found for requested labs within last 8760 hours.  Recent Lipid Panel No results found for: CHOL, TRIG, HDL, CHOLHDL, VLDL, LDLCALC, LDLDIRECT  Physical  Exam:    VS:  There were no vitals taken for this visit.    Wt Readings from Last 3 Encounters:  06/27/17 (!) 310 lb (140.6 kg)  02/23/15 295 lb (133.8 kg)     GEN: *** Well nourished, well developed in no acute distress HEENT: Normal NECK: No JVD; No carotid bruits LYMPHATICS: No lymphadenopathy CARDIAC: ***RRR, no murmurs, rubs, gallops RESPIRATORY:  Clear to auscultation without rales, wheezing or rhonchi  ABDOMEN: Soft, non-tender, non-distended MUSCULOSKELETAL:  No edema; No deformity  SKIN: Warm and dry NEUROLOGIC:  Alert and oriented x 3 PSYCHIATRIC:  Normal affect     Signed, Shirlee More, MD  04/29/2019 4:50 PM    [facility]

## 2019-04-29 NOTE — Telephone Encounter (Signed)
[place] and requested records for this patient be faxed over as soon as possible as patient has appt with [facility] 04/30/2019

## 2019-04-30 ENCOUNTER — Telehealth: Payer: Medicare Other | Admitting: Cardiology

## 2019-05-02 ENCOUNTER — Encounter: Payer: Self-pay | Admitting: *Deleted

## 2019-05-02 DIAGNOSIS — R0789 Other chest pain: Secondary | ICD-10-CM

## 2019-05-02 DIAGNOSIS — R42 Dizziness and giddiness: Secondary | ICD-10-CM | POA: Insufficient documentation

## 2019-05-02 NOTE — Progress Notes (Signed)
Office visit  Date:  05/03/2019   ID:  Lawrence Medina, DOB 12-16-1965, MRN [id number]  PCP:  Leonard Downing, MD  Cardiologist:  Shirlee More, MD  Electrophysiologist:  None   Evaluation Performed:  Consultation - [place] Muta Vo was referred by Dr Arelia Sneddon for the evaluation of chest pain.  Chief Complaint:  Chest pain  History of Present Illness:    Lawrence Medina is a 53 y.o. male referred for cardiology consultation for chest pain by Dr. Arelia Sneddon  The patient does not have symptoms concerning for COVID-19 infection (fever, chills, cough, or new shortness of breath).   He was scheduled initially at the onset of COVID-19 however he had a respiratory infection and was deferred.  For the last several months he has not done well he is gained weight somewhere in the range of 40 pounds marked edema shortness of breath orthopnea and he has nonproductive cough he is having chest pain that is nonexertional localized to the left precordium sharp intermittent and frequent.  He has not had a chest x-ray and has smoked a pack a day for the last 40 years and continues to smoke.  He is not wheezing.  He is also had a couple episodes where he has had diffuse pressure in his chest not exertional his brother recently had PCI and stent he is concerned that he is underlying heart disease with his edema shortness of breath and chest discomfort.  He has no history of congenital rheumatic heart disease and finds himself equally limited by exertional shortness of breath more than ADLs as well as his chronic back pain.  He has had no syncope or TIA. Past Medical History:  Diagnosis Date  . Arthritis   . Complication of anesthesia   . DDD (degenerative disc disease), lumbar   . GERD (gastroesophageal reflux disease)   . High cholesterol   . Hypertension   . PONV (postoperative nausea and vomiting)   . Seizures ([place]) 1982   x1 in 1982- unknown cause- none since  . Shortness of breath dyspnea    with exertion    Past Surgical History:  Procedure Laterality Date  . HERNIA REPAIR    . SHOULDER ARTHROSCOPY WITH ROTATOR CUFF REPAIR Left 02/23/2015   Procedure: SHOULDER ARTHROSCOPY WITH ROTATOR CUFF REPAIR BIOTENDONESIS REPAIR;  Surgeon: Marybelle Killings, MD;  Location: [facility];  Service: Orthopedics;  Laterality: Left;  . SUBMANDIBULAR GLAND EXCISION Left 1975  . TOTAL KNEE ARTHROPLASTY Left 1986     Current Meds  Medication Sig  . albuterol (VENTOLIN HFA) 108 (90 Base) MCG/ACT inhaler Inhale 2 puffs into the lungs every 4 (four) hours as needed.   Marland Kitchen amLODipine (NORVASC) 10 MG tablet Take 1 tablet (10 mg total) by mouth daily.  Marland Kitchen aspirin EC 81 MG tablet Take 81 mg by mouth daily.  . clonazePAM (KLONOPIN) 0.5 MG tablet   . clonazePAM (KLONOPIN) 1 MG tablet Take 1 mg by mouth at bedtime as needed.  . cyclobenzaprine (FLEXERIL) 10 MG tablet   . doxepin (SINEQUAN) 25 MG capsule   . fluticasone (FLONASE) 50 MCG/ACT nasal spray Place 2 sprays into both nostrils at bedtime.   Marland Kitchen lisinopril (ZESTRIL) 40 MG tablet Take 1 tablet by mouth daily.  Marland Kitchen loratadine (CLARITIN) 10 MG tablet Take 10 mg by mouth daily.  Marland Kitchen lovastatin (MEVACOR) 20 MG tablet Take 20 mg by mouth daily.  Marland Kitchen omeprazole (PRILOSEC) 40 MG capsule Take 40 mg by mouth daily.  . sildenafil (  VIAGRA) 100 MG tablet Take 100 mg by mouth as needed for erectile dysfunction.     Allergies:   Patient has no known allergies.   Social History   Tobacco Use  . Smoking status: Current Every Day Smoker    Packs/day: 0.25    Years: 34.00    Pack years: 8.50    Types: Cigarettes  . Smokeless tobacco: Never Used  Substance Use Topics  . Alcohol use: Yes    Comment: very seldom  . Drug use: Yes    Types: Marijuana     Family Hx: The patient's family history includes Cancer in his father and mother; Hypertension in his mother; Sleep apnea in his mother.  ROS:   Please see the history of present illness.    Review of Systems  Constitution: Positive  for malaise/fatigue.  HENT: Negative.   Eyes: Negative.   Cardiovascular: Positive for chest pain, dyspnea on exertion, leg swelling and orthopnea.  Respiratory: Positive for shortness of breath and snoring.   Endocrine: Negative.   Hematologic/Lymphatic: Negative.   Skin: Negative.   Musculoskeletal: Positive for back pain and joint swelling.  Gastrointestinal: Negative.   Genitourinary: Negative.   Neurological: Negative.   Psychiatric/Behavioral: Negative.    All other systems reviewed and are negative.   Prior CV studies:   The following studies were reviewed today:    Labs/Other Tests and Data Reviewed:    EKG:  An ECG dated today was personally reviewed today and demonstrated:  Sinus tachycardia 108 BPM RBBB 1 PVC no ischemic changes  Recent Labs: No results found for requested labs within last 8760 hours.   Recent Lipid Panel No results found for: CHOL, TRIG, HDL, CHOLHDL, LDLCALC, LDLDIRECT  Wt Readings from Last 3 Encounters:  05/03/19 (!) 339 lb 0.6 oz (153.8 kg)  06/27/17 (!) 310 lb (140.6 kg)  02/23/15 295 lb (133.8 kg)     Objective:    Vital Signs:  BP (!) 152/102   Pulse (!) 110   Ht 6' (1.829 m)   Wt (!) 339 lb 0.6 oz (153.8 kg)   SpO2 95%   BMI 45.98 kg/m    VITAL SIGNS:  reviewed GEN:  no acute distress EYES:  sclerae anicteric, EOMI - Extraocular Movements Intact RESPIRATORY:  normal respiratory effort, symmetric expansion CARDIOVASCULAR:  lower extremity edema noted SKIN:  no rash, lesions or ulcers. MUSCULOSKELETAL:  no obvious deformities. NEURO:  alert and oriented x 3, no obvious focal deficit PSYCH:  normal affect he has morbid obesity and difficulty ambulating in transfer with back pain he has 4+ edema to his umbilicus and mild neck vein distention.  Chest shows diffusely diminished breath sounds and he is quite tender over the left costochondral junction that reproduces his sharp chest pain heart is regular distant S1-S2 normal no  gallop.  ASSESSMENT & PLAN:    1. Chest pain nonanginal by physical exam appears to be costochondral but certainly is at very high cardiovascular risk and some element of this is anginal.  At this time I think decompensated heart failure precludes a noninvasive evaluation will initiate treatment with a diuretic reassess in a few weeks to see if he could posture himself for cardiac CTA.  I have warmed with a prescription for nitroglycerin.  As a cigarette smoker he is at risk for lung cancer chest x-ray ordered today. 2. Hypertensive heart disease with heart failure, clinically appears to be in heart failure start a loop diuretic low-dose potassium check labs with  labs including proBNP and echocardiogram ordered class I.  I will see him back in the office after diuresis echocardiogram and chest x-ray formulate a plan for an ischemia evaluation.  Hypertension is poorly controlled continue his current treatment for now calcium channel blocker ACE inhibitor and reassess after diuresis.  I suspect his blood pressure will be at target. 3. Hyperlipidemia continue his present statin 4. Current every day smoker strongly encouraged him to stop smoking and offered counseling education he told me he is not ready to stop and is not committed at this time to smoking cessation 5. Morbid obesity body mass index greater than 45 he certainly is at very high risk of sleep apnea and will need to address the visit issue next office interaction  COVID-19 Education: The signs and symptoms of COVID-19 were discussed with the patient and how to seek care for testing (follow up with PCP or arrange E-visit).  The importance of social distancing was discussed today.  Time:   Today, I have spent 40 minutes with the patient with telehealth technology discussing the above problems.  Medical decision making is very complex with multiple simultaneous problem including heart failure decompensated hypertension not at target and chest  pain with multiple differential diagnoses including costochondral potential lung cancer and cigarette smoker and cardiac ischemia and the need for counseling and shared decision making and formulating a treatment plan   Medication Adjustments/Labs and Tests Ordered: Current medicines are reviewed at length with the patient today.  Concerns regarding medicines are outlined above.   Tests Ordered: Orders Placed This Encounter  Procedures  . DG Chest 2 View  . CBC  . Basic Metabolic Panel (BMET)  . Pro b natriuretic peptide (BNP)  . ECHOCARDIOGRAM COMPLETE    Medication Changes: Meds ordered this encounter  Medications  . nitroGLYCERIN (NITROSTAT) 0.4 MG SL tablet    Sig: Place 1 tablet (0.4 mg total) under the tongue every 5 (five) minutes as needed for chest pain.    Dispense:  25 tablet    Refill:  3  . furosemide (LASIX) 20 MG tablet    Sig: Take 1 tablet (20 mg total) by mouth daily.    Dispense:  90 tablet    Refill:  1  . potassium chloride (K-DUR) 10 MEQ tablet    Sig: Take 1 tablet (10 mEq total) by mouth daily.    Dispense:  90 tablet    Refill:  1    Disposition:  Follow up in 3 week(s)  Signed, Shirlee More, MD  05/03/2019 4:55 PM    [facility]

## 2019-05-03 ENCOUNTER — Telehealth (INDEPENDENT_AMBULATORY_CARE_PROVIDER_SITE_OTHER): Payer: Medicare Other | Admitting: Cardiology

## 2019-05-03 ENCOUNTER — Other Ambulatory Visit: Payer: Self-pay

## 2019-05-03 ENCOUNTER — Encounter: Payer: Self-pay | Admitting: Cardiology

## 2019-05-03 VITALS — BP 152/102 | HR 110 | Ht 72.0 in | Wt 339.0 lb

## 2019-05-03 DIAGNOSIS — R0789 Other chest pain: Secondary | ICD-10-CM

## 2019-05-03 DIAGNOSIS — E782 Mixed hyperlipidemia: Secondary | ICD-10-CM

## 2019-05-03 DIAGNOSIS — I110 Hypertensive heart disease with heart failure: Secondary | ICD-10-CM | POA: Diagnosis not present

## 2019-05-03 DIAGNOSIS — F17200 Nicotine dependence, unspecified, uncomplicated: Secondary | ICD-10-CM | POA: Insufficient documentation

## 2019-05-03 DIAGNOSIS — Z6841 Body Mass Index (BMI) 40.0 and over, adult: Secondary | ICD-10-CM

## 2019-05-03 MED ORDER — POTASSIUM CHLORIDE ER 10 MEQ PO TBCR: 10.0000 meq | EXTENDED_RELEASE_TABLET | Freq: Every day | ORAL | 1 refills | Status: DC

## 2019-05-03 MED ORDER — NITROGLYCERIN 0.4 MG SL SUBL: 0.4000 mg | SUBLINGUAL_TABLET | SUBLINGUAL | 3 refills | Status: DC | PRN

## 2019-05-03 MED ORDER — FUROSEMIDE 20 MG PO TABS: 20.0000 mg | ORAL_TABLET | Freq: Every day | ORAL | 1 refills | Status: DC

## 2019-05-03 NOTE — Patient Instructions (Signed)
Medication Instructions:  Your physician has recommended you make the following change in your medication:   START nitroglycerin as needed for chest pain: When having chest pain, stop what you are doing and sit down. Take 1 nitro, wait 5 minutes. Still having chest pain, take 1 nitro, wait 5 minutes. Still having chest pain, take 1 nitro, dial 911. Total of 3 nitro in 15 minutes.  START furosemide (lasix) 20 mg: Take 1 tablet daily   START potassium chloride (K-dur) 10 mEq: Take 1 tablet daily  If you need a refill on your cardiac medications before your next appointment, please call your pharmacy.   Lab work: Your physician recommends that you return for lab work today: CBC, BMP, ProBNP.   If you have labs (blood work) drawn today and your tests are completely normal, you will receive your results only by: Marland Kitchen MyChart Message (if you have MyChart) OR . A paper copy in the mail If you have any lab test that is abnormal or we need to change your treatment, we will call you to review the results.  Testing/Procedures: Your physician has requested that you have an echocardiogram. Echocardiography is a painless test that uses sound waves to create images of your heart. It provides your doctor with information about the size and shape of your heart and how well your heart's chambers and valves are working. This procedure takes approximately one hour. There are no restrictions for this procedure.  A chest x-ray takes a picture of the organs and structures inside the chest, including the heart, lungs, and blood vessels. This test can show several things, including, whether the heart is enlarges; whether fluid is building up in the lungs; and whether pacemaker / defibrillator leads are still in place.   Follow-Up: At [facility], you and your health needs are our priority.  As part of our continuing mission to provide you with exceptional heart care, we have created designated Provider Care Teams.   These Care Teams include your primary Cardiologist (physician) and Advanced Practice Providers (APPs -  Physician Assistants and Nurse Practitioners) who all work together to provide you with the care you need, when you need it. You will need a follow up appointment in 3 weeks.     Furosemide tablets What is this medicine? FUROSEMIDE (fyoor [place] se mide) is a diuretic. It helps you make more urine and to lose salt and excess water from your body. This medicine is used to treat high blood pressure, and edema or swelling from heart, kidney, or liver disease. This medicine Pasha be used for other purposes; ask your health care provider or pharmacist if you have questions. COMMON BRAND NAME(S): Active-Medicated Specimen Kit, Delone, Diuscreen, Lasix, RX Specimen Collection Kit, Specimen Collection Kit, URINX Medicated Specimen Collection What should I tell my health care provider before I take this medicine? They need to know if you have any of these conditions: -abnormal blood electrolytes -diarrhea or vomiting -gout -heart disease -kidney disease, small amounts of urine, or difficulty passing urine -liver disease -thyroid disease -an unusual or allergic reaction to furosemide, sulfa drugs, other medicines, foods, dyes, or preservatives -pregnant or trying to get pregnant -breast-feeding How should I use this medicine? Take this medicine by mouth with a glass of water. Follow the directions on the prescription label. You Mauzy take this medicine with or without food. If it upsets your stomach, take it with food or milk. Do not take your medicine more often than directed. Remember that  you will need to pass more urine after taking this medicine. Do not take your medicine at a time of day that will cause you problems. Do not take at bedtime. Talk to your pediatrician regarding the use of this medicine in children. While this drug Kearl be prescribed for selected conditions, precautions do  apply. Overdosage: If you think you have taken too much of this medicine contact a poison control center or emergency room at once. NOTE: This medicine is only for you. Do not share this medicine with others. What if I miss a dose? If you miss a dose, take it as soon as you can. If it is almost time for your next dose, take only that dose. Do not take double or extra doses. What Fetters interact with this medicine? -aspirin and aspirin-like medicines -certain antibiotics -chloral hydrate -cisplatin -cyclosporine -digoxin -diuretics -laxatives -lithium -medicines for blood pressure -medicines that relax muscles for surgery -methotrexate -NSAIDs, medicines for pain and inflammation like ibuprofen, naproxen, or indomethacin -phenytoin -steroid medicines like prednisone or cortisone -sucralfate -thyroid hormones This list Zawadzki not describe all possible interactions. Give your health care provider a list of all the medicines, herbs, non-prescription drugs, or dietary supplements you use. Also tell them if you smoke, drink alcohol, or use illegal drugs. Some items Gloss interact with your medicine. What should I watch for while using this medicine? Visit your doctor or health care professional for regular checks on your progress. Check your blood pressure regularly. Ask your doctor or health care professional what your blood pressure should be, and when you should contact him or her. If you are a diabetic, check your blood sugar as directed. You Valek need to be on a special diet while taking this medicine. Check with your doctor. Also, ask how many glasses of fluid you need to drink a day. You must not get dehydrated. You Colligan get drowsy or dizzy. Do not drive, use machinery, or do anything that needs mental alertness until you know how this drug affects you. Do not stand or sit up quickly, especially if you are an older patient. This reduces the risk of dizzy or fainting spells. Alcohol can make you  more drowsy and dizzy. Avoid alcoholic drinks. This medicine can make you more sensitive to the sun. Keep out of the sun. If you cannot avoid being in the sun, wear protective clothing and use sunscreen. Do not use sun lamps or tanning beds/booths. What side effects Wisehart I notice from receiving this medicine? Side effects that you should report to your doctor or health care professional as soon as possible: -blood in urine or stools -dry mouth -fever or chills -hearing loss or ringing in the ears -irregular heartbeat -muscle pain or weakness, cramps -skin rash -stomach upset, pain, or nausea -tingling or numbness in the hands or feet -unusually weak or tired -vomiting or diarrhea -yellowing of the eyes or skin Side effects that usually do not require medical attention (report to your doctor or health care professional if they continue or are bothersome): -headache -loss of appetite -unusual bleeding or bruising This list Marsalis not describe all possible side effects. Call your doctor for medical advice about side effects. You Peretz report side effects to FDA at 1-800-FDA-1088. Where should I keep my medicine? Keep out of the reach of children. Store at room temperature between 15 and 30 degrees C (59 and 86 degrees F). Protect from light. Throw away any unused medicine after the expiration date. NOTE: This sheet  is a summary. It Lansing not cover all possible information. If you have questions about this medicine, talk to your doctor, pharmacist, or health care provider.  2019 Elsevier/Gold Standard (2015-02-18 13:49:50)    Nitroglycerin sublingual tablets What is this medicine? NITROGLYCERIN (nye troe GLI ser in) is a type of vasodilator. It relaxes blood vessels, increasing the blood and oxygen supply to your heart. This medicine is used to relieve chest pain caused by angina. It is also used to prevent chest pain before activities like climbing stairs, going outdoors in cold weather, or  sexual activity. This medicine Saidi be used for other purposes; ask your health care provider or pharmacist if you have questions. COMMON BRAND NAME(S): Nitroquick, Nitrostat, Nitrotab What should I tell my health care provider before I take this medicine? They need to know if you have any of these conditions: -anemia -head injury, recent stroke, or bleeding in the brain -liver disease -previous heart attack -an unusual or allergic reaction to nitroglycerin, other medicines, foods, dyes, or preservatives -pregnant or trying to get pregnant -breast-feeding How should I use this medicine? Take this medicine by mouth as needed. At the first sign of an angina attack (chest pain or tightness) place one tablet under your tongue. You can also take this medicine 5 to 10 minutes before an event likely to produce chest pain. Follow the directions on the prescription label. Let the tablet dissolve under the tongue. Do not swallow whole. Replace the dose if you accidentally swallow it. It will help if your mouth is not dry. Saliva around the tablet will help it to dissolve more quickly. Do not eat or drink, smoke or chew tobacco while a tablet is dissolving. If you are not better within 5 minutes after taking ONE dose of nitroglycerin, call 9-1-1 immediately to seek emergency medical care. Do not take more than 3 nitroglycerin tablets over 15 minutes. If you take this medicine often to relieve symptoms of angina, your doctor or health care professional Ijames provide you with different instructions to manage your symptoms. If symptoms do not go away after following these instructions, it is important to call 9-1-1 immediately. Do not take more than 3 nitroglycerin tablets over 15 minutes. Talk to your pediatrician regarding the use of this medicine in children. Special care Furgeson be needed. Overdosage: If you think you have taken too much of this medicine contact a poison control center or emergency room at  once. NOTE: This medicine is only for you. Do not share this medicine with others. What if I miss a dose? This does not apply. This medicine is only used as needed. What Dahm interact with this medicine? Do not take this medicine with any of the following medications: -certain migraine medicines like ergotamine and dihydroergotamine (DHE) -medicines used to treat erectile dysfunction like sildenafil, tadalafil, and vardenafil -riociguat This medicine Lex also interact with the following medications: -alteplase -aspirin -heparin -medicines for high blood pressure -medicines for mental depression -other medicines used to treat angina -phenothiazines like chlorpromazine, mesoridazine, prochlorperazine, thioridazine This list Osowski not describe all possible interactions. Give your health care provider a list of all the medicines, herbs, non-prescription drugs, or dietary supplements you use. Also tell them if you smoke, drink alcohol, or use illegal drugs. Some items Freeney interact with your medicine. What should I watch for while using this medicine? Tell your doctor or health care professional if you feel your medicine is no longer working. Keep this medicine with you at all times.  Sit or lie down when you take your medicine to prevent falling if you feel dizzy or faint after using it. Try to remain calm. This will help you to feel better faster. If you feel dizzy, take several deep breaths and lie down with your feet propped up, or bend forward with your head resting between your knees. You Chasteen get drowsy or dizzy. Do not drive, use machinery, or do anything that needs mental alertness until you know how this drug affects you. Do not stand or sit up quickly, especially if you are an older patient. This reduces the risk of dizzy or fainting spells. Alcohol can make you more drowsy and dizzy. Avoid alcoholic drinks. Do not treat yourself for coughs, colds, or pain while you are taking this medicine  without asking your doctor or health care professional for advice. Some ingredients Voigt increase your blood pressure. What side effects Kwiecinski I notice from receiving this medicine? Side effects that you should report to your doctor or health care professional as soon as possible: -blurred vision -dry mouth -skin rash -sweating -the feeling of extreme pressure in the head -unusually weak or tired Side effects that usually do not require medical attention (report to your doctor or health care professional if they continue or are bothersome): -flushing of the face or neck -headache -irregular heartbeat, palpitations -nausea, vomiting This list Wormley not describe all possible side effects. Call your doctor for medical advice about side effects. You Hanawalt report side effects to FDA at 1-800-FDA-1088. Where should I keep my medicine? Keep out of the reach of children. Store at room temperature between 20 and 25 degrees C (68 and 77 degrees F). Store in Chief of Staff. Protect from light and moisture. Keep tightly closed. Throw away any unused medicine after the expiration date. NOTE: This sheet is a summary. It Greggs not cover all possible information. If you have questions about this medicine, talk to your doctor, pharmacist, or health care provider.  2019 Elsevier/Gold Standard (2013-09-26 17:57:36)    Potassium chloride tablets, extended-release tablets or capsules What is this medicine? POTASSIUM CHLORIDE (poe TASS i um KLOOR ide) is a potassium supplement used to prevent and to treat low potassium. Potassium is important for the heart, muscles, and nerves. Too much or too little potassium in the body can cause serious problems. This medicine Cutsforth be used for other purposes; ask your health care provider or pharmacist if you have questions. COMMON BRAND NAME(S): ED-K+10, K-10, K-8, K-Dur, K-Tab, Kaon-CL, Klor-Con, Klor-Con M10, Klor-Con M15, Klor-Con M20, Klotrix, Micro-K, Micro-K Extencaps,  Slow-K What should I tell my health care provider before I take this medicine? They need to know if you have any of these conditions: -Addison's disease -dehydration -diabetes -difficulty swallowing -heart disease -high levels of potassium in the blood -irregular heartbeat -kidney disease -recent severe burn -stomach ulcers or other stomach problems -an unusual or allergic reaction to potassium, tartrazine, other medicines, foods, dyes, or preservatives -pregnant or trying to get pregnant -breast-feeding How should I use this medicine? Take this medicine by mouth with a full glass of water. Take with food. Follow the directions on the prescription label. Do not suck on, crush, or chew this medicine. If you have difficulty swallowing, ask the pharmacist how to take. Take your medicine at regular intervals. Do not take it more often than directed. Do not stop taking except on your doctor's advice. Talk to your pediatrician regarding the use of this medicine in children. Special care Lotter be  needed. Overdosage: If you think you have taken too much of this medicine contact a poison control center or emergency room at once. NOTE: This medicine is only for you. Do not share this medicine with others. What if I miss a dose? If you miss a dose, take it as soon as you can. If it is almost time for your next dose, take only that dose. Do not take double or extra doses. What Colston interact with this medicine? Do not take this medicine with any of the following medications: -certain diuretics such as spironolactone, triamterene -certain medicines for stomach problems like atropine; difenoxin and glycopyrrolate -eplerenone -sodium polystyrene sulfonate This medicine Lamadrid also interact with the following medications: -certain medicines for blood pressure or heart disease like lisinopril, losartan, quinapril, valsartan -medicines that lower your chance of fighting infection such as cyclosporine,  tacrolimus -NSAIDs, medicines for pain and inflammation, like ibuprofen or naproxen -other potassium supplements -salt substitutes This list Jambor not describe all possible interactions. Give your health care provider a list of all the medicines, herbs, non-prescription drugs, or dietary supplements you use. Also tell them if you smoke, drink alcohol, or use illegal drugs. Some items Totty interact with your medicine. What should I watch for while using this medicine? Visit your doctor or health care professional for regular check ups. You will need lab work done regularly. You Boehne need to be on a special diet while taking this medicine. Ask your doctor. What side effects Fearnow I notice from receiving this medicine? Side effects that you should report to your doctor or health care professional as soon as possible: -allergic reactions like skin rash, itching or hives, swelling of the face, lips, or tongue -black, tarry stools -breathing problems -confusion -heartburn -fast, irregular heartbeat -feeling faint or lightheaded, falls -low blood pressure -numbness or tingling in hands or feet -pain when swallowing -unusually weak or tired -weakness, heaviness of legs Side effects that usually do not require medical attention (report to your doctor or health care professional if they continue or are bothersome): -diarrhea -nausea, vomiting -stomach pain This list Petrosky not describe all possible side effects. Call your doctor for medical advice about side effects. You Vinje report side effects to FDA at 1-800-FDA-1088. Where should I keep my medicine? Keep out of the reach of children. Store at room temperature between 15 and 30 degrees C (59 and 86 degrees F ). Keep bottle closed tightly to protect this medicine from light and moisture. Throw away any unused medicine after the expiration date. NOTE: This sheet is a summary. It Soave not cover all possible information. If you have questions about this  medicine, talk to your doctor, pharmacist, or health care provider.  2019 Elsevier/Gold Standard (2016-08-31 11:43:27)

## 2019-05-04 LAB — CBC
Hematocrit: 45.3 % (ref 37.5–51.0)
Hemoglobin: 15.5 g/dL (ref 13.0–17.7)
MCH: 29.3 pg (ref 26.6–33.0)
MCHC: 34.2 g/dL (ref 31.5–35.7)
MCV: 86 fL (ref 79–97)
Platelets: 254 10*3/uL (ref 150–450)
RBC: 5.29 x10E6/uL (ref 4.14–5.80)
RDW: 13.6 % (ref 11.6–15.4)
WBC: 8.2 10*3/uL (ref 3.4–10.8)

## 2019-05-04 LAB — BASIC METABOLIC PANEL
BUN/Creatinine Ratio: 12 (ref 9–20)
BUN: 15 mg/dL (ref 6–24)
CO2: 22 mmol/L (ref 20–29)
Calcium: 9.2 mg/dL (ref 8.7–10.2)
Chloride: 103 mmol/L (ref 96–106)
Creatinine, Ser: 1.21 mg/dL (ref 0.76–1.27)
GFR calc Af Amer: 78 mL/min/{1.73_m2} (ref >59)
GFR calc non Af Amer: 67 mL/min/{1.73_m2} (ref >59)
Glucose: 92 mg/dL (ref 65–99)
Potassium: 4.1 mmol/L (ref 3.5–5.2)
Sodium: 140 mmol/L (ref 134–144)

## 2019-05-04 LAB — PRO B NATRIURETIC PEPTIDE: NT-Pro BNP: 20 pg/mL (ref 0–121)

## 2019-05-07 NOTE — Addendum Note (Signed)
Addended by: Austin Miles on: 05/07/2019 01:40 PM   Modules accepted: Orders

## 2019-05-08 ENCOUNTER — Ambulatory Visit (HOSPITAL_BASED_OUTPATIENT_CLINIC_OR_DEPARTMENT_OTHER)
Admission: RE | Admit: 2019-05-08 | Discharge: 2019-05-08 | Disposition: A | Discharge status: Home / Self Care | Payer: Medicare Other | Source: Ambulatory Visit | Attending: Cardiology | Admitting: Cardiology

## 2019-05-08 ENCOUNTER — Other Ambulatory Visit: Payer: Self-pay

## 2019-05-08 DIAGNOSIS — E782 Mixed hyperlipidemia: Secondary | ICD-10-CM | POA: Diagnosis not present

## 2019-05-08 DIAGNOSIS — R0789 Other chest pain: Secondary | ICD-10-CM | POA: Diagnosis present

## 2019-05-08 MED ORDER — PERFLUTREN LIPID MICROSPHERE
1.0000 mL | INTRAVENOUS | Status: DC | PRN
  Administered 2019-05-08: 11:00:00 2 mL via INTRAVENOUS
  Filled 2019-05-08: qty 10

## 2019-05-08 NOTE — Progress Notes (Signed)
  Echocardiogram 2D Echocardiogram has been performed.  Cardell Peach 05/08/2019, 11:26 AM

## 2019-05-21 ENCOUNTER — Institutional Professional Consult (permissible substitution): Payer: Medicare Other | Admitting: Pulmonary Disease

## 2019-05-22 ENCOUNTER — Ambulatory Visit (INDEPENDENT_AMBULATORY_CARE_PROVIDER_SITE_OTHER): Payer: Medicare Other | Admitting: Internal Medicine

## 2019-05-22 ENCOUNTER — Other Ambulatory Visit: Payer: Self-pay | Admitting: Family Medicine

## 2019-05-22 ENCOUNTER — Encounter: Payer: Self-pay | Admitting: Internal Medicine

## 2019-05-22 ENCOUNTER — Other Ambulatory Visit: Payer: Self-pay

## 2019-05-22 VITALS — BP 138/80 | HR 95 | Temp 97.8°F | Ht 72.0 in | Wt 350.2 lb

## 2019-05-22 DIAGNOSIS — R2242 Localized swelling, mass and lump, left lower limb: Secondary | ICD-10-CM

## 2019-05-22 DIAGNOSIS — G4733 Obstructive sleep apnea (adult) (pediatric): Secondary | ICD-10-CM | POA: Diagnosis not present

## 2019-05-22 DIAGNOSIS — F17200 Nicotine dependence, unspecified, uncomplicated: Secondary | ICD-10-CM

## 2019-05-22 DIAGNOSIS — J449 Chronic obstructive pulmonary disease, unspecified: Secondary | ICD-10-CM | POA: Diagnosis not present

## 2019-05-22 DIAGNOSIS — Z6841 Body Mass Index (BMI) 40.0 and over, adult: Secondary | ICD-10-CM

## 2019-05-22 MED ORDER — UMECLIDINIUM-VILANTEROL 62.5-25 MCG/INH IN AEPB: 1.0000 | INHALATION_SPRAY | Freq: Every day | RESPIRATORY_TRACT | 0 refills | Status: DC

## 2019-05-22 NOTE — Assessment & Plan Note (Signed)
If we can improve daytime sleepiness, it will be easier to talk about meaningful weight loss.

## 2019-05-22 NOTE — Patient Instructions (Addendum)
Order- schedule PFT  Dx COPD mixed type  Order- schedule NPSG dx OSA  Sample Anoro maintenance inhaler     Inhale 1 puff, once daily  See if this helps your breathing

## 2019-05-22 NOTE — Assessment & Plan Note (Signed)
Probably severe OSA with associated daytime sleepiness. Recognize continues sleep fragmentation despite use of 2 sedating meds- doxepin and clonazepam. Education done Plan- schedule sleep study. Then see if effort should be made to change to less sedating meds.

## 2019-05-22 NOTE — Progress Notes (Signed)
05/22/2019- 37 yoM smoker, for sleep and pulmonary evaluation courtesy of Dr Arelia Sneddon and Dr Bettina Gavia. Having a lot of difficulty staying asleep. Only sleeps in 28min intervals. Falling out of chairs sitting up due to falling asleep. Also has difficulty staying awake., and he snores. Medical problem list includes HBP, Tobacco use, Rhinitis, COPD Seen here and at [facility] in past for respiratory problems, but no longer followed for these.  Epworth score 24 Body weight 350 lbs- reports up 60 lbs in 2 years. Disablity due to low back pain. Lives w parents.Doesn't drive. Over past 10 years sleep fragmented, snoring very loud, daytime sleepiness severe.  Taking clonazepam at night for Restless Legs, also on Doxepin. Indicates partial thyroidectomy(?) as a child, aware of COPD. Denies MI, CVA, DVT/ PE. Leg stays red and Doppler venogram has been ordered.  CXR- 05/08/2019- Mild peribronchial thickening which Auvil be related to smoking. No acute cardiopulmonary disease.  Prior to Admission medications   Medication Sig Start Date End Date Taking? Authorizing Provider  albuterol (VENTOLIN HFA) 108 (90 Base) MCG/ACT inhaler Inhale 2 puffs into the lungs every 4 (four) hours as needed.  03/07/19  Yes [provider]  aspirin EC 81 MG tablet Take 81 mg by mouth daily.   Yes [provider]  clonazePAM (KLONOPIN) 0.5 MG tablet  04/23/19  Yes [provider]  cyclobenzaprine (FLEXERIL) 10 MG tablet  04/22/19  Yes [provider]  doxepin (SINEQUAN) 25 MG capsule  04/22/19  Yes [provider]  fluticasone (FLONASE) 50 MCG/ACT nasal spray Place 2 sprays into both nostrils at bedtime.  03/07/19  Yes [provider]  furosemide (LASIX) 20 MG tablet Take 1 tablet (20 mg total) by mouth daily. 05/03/19 08/01/19 Yes Richardo Priest, MD  lisinopril (ZESTRIL) 40 MG tablet Take 1 tablet by mouth daily. 04/22/19  Yes [provider]  loratadine (CLARITIN) 10 MG tablet Take 10  mg by mouth daily.   Yes [provider]  lovastatin (MEVACOR) 20 MG tablet Take 20 mg by mouth daily.   Yes [provider]  nitroGLYCERIN (NITROSTAT) 0.4 MG SL tablet Place 1 tablet (0.4 mg total) under the tongue every 5 (five) minutes as needed for chest pain. 05/03/19 08/01/19 Yes Richardo Priest, MD  omeprazole (PRILOSEC) 40 MG capsule Take 40 mg by mouth daily.   Yes [provider]  potassium chloride (K-DUR) 10 MEQ tablet Take 1 tablet (10 mEq total) by mouth daily. 05/03/19 08/01/19 Yes Richardo Priest, MD  sildenafil (VIAGRA) 100 MG tablet Take 100 mg by mouth as needed for erectile dysfunction.   Yes [provider]  umeclidinium-vilanterol (ANORO ELLIPTA) 62.5-25 MCG/INH AEPB Inhale 1 puff into the lungs daily. 05/22/19   Deneise Lever, MD   Past Medical History:  Diagnosis Date  . Allergic rhinitis   . Arthritis   . Asthma   . Complication of anesthesia   . DDD (degenerative disc disease), lumbar   . GERD (gastroesophageal reflux disease)   . Headache   . High cholesterol   . Hypertension   . PONV (postoperative nausea and vomiting)   . Seizures ([place]) 1982   x1 in 1982- unknown cause- none since  . Shortness of breath dyspnea    with exertion   Past Surgical History:  Procedure Laterality Date  . HERNIA REPAIR    . SHOULDER ARTHROSCOPY WITH ROTATOR CUFF REPAIR Left 02/23/2015   Procedure: SHOULDER ARTHROSCOPY WITH ROTATOR CUFF REPAIR BIOTENDONESIS REPAIR;  Surgeon:  Marybelle Killings, MD;  Location: [place];  Service: Orthopedics;  Laterality: Left;  . SUBMANDIBULAR GLAND EXCISION Left 1975  . TOTAL KNEE ARTHROPLASTY Left 1986   Family History  Problem Relation Age of Onset  . Hypertension Mother   . Cancer Mother        skin  . Sleep apnea Mother   . Cancer Father        lung   Social History   Socioeconomic History  . Marital status: Single    Spouse name: Not on file  . Number of children: Not on file  . Years of education: Not  on file  . Highest education level: Not on file  Occupational History  . Not on file  Social Needs  . Financial resource strain: Not on file  . Food insecurity:    Worry: Not on file    Inability: Not on file  . Transportation needs:    Medical: Not on file    Non-medical: Not on file  Tobacco Use  . Smoking status: Current Every Day Smoker    Packs/day: 0.25    Years: 34.00    Pack years: 8.50    Types: Cigarettes  . Smokeless tobacco: Never Used  Substance and Sexual Activity  . Alcohol use: Yes    Comment: very seldom  . Drug use: Yes    Types: Marijuana  . Sexual activity: Not on file  Lifestyle  . Physical activity:    Days per week: Not on file    Minutes per session: Not on file  . Stress: Not on file  Relationships  . Social connections:    Talks on phone: Not on file    Gets together: Not on file    Attends religious service: Not on file    Active member of club or organization: Not on file    Attends meetings of clubs or organizations: Not on file    Relationship status: Not on file  . Intimate partner violence:    Fear of current or ex partner: Not on file    Emotionally abused: Not on file    Physically abused: Not on file    Forced sexual activity: Not on file  Other Topics Concern  . Not on file  Social History Narrative  . Not on file   ROS-see HPI  + = positive Constitutional:    weight loss, night sweats, fevers, chills,+ fatigue, lassitude. HEENT:    headaches, difficulty swallowing, tooth/dental problems, sore throat,       sneezing, itching, ear ache, nasal congestion, post nasal drip, snoring CV:    chest pain, orthopnea, PND, swelling in lower extremities, anasarca,                                  dizziness, palpitations Resp:   +shortness of breath with exertion or at rest.                productive cough,   non-productive cough, coughing up of blood.              change in color of mucus.  wheezing.   Skin:    rash or lesions. GI:  No-    heartburn, indigestion, abdominal pain, nausea, vomiting, diarrhea,                 change in bowel habits, loss of appetite GU: dysuria, change in color  of urine, no urgency or frequency.   flank pain. MS:   joint pain, stiffness, decreased range of motion, back pain. Neuro-     nothing unusual Psych:  change in mood or affect.  depression or anxiety.   memory loss.  OBJ- Physical Exam General-   Drowsy and snoring if not engaged, drifting off in conversation, Oriented, Affect-appropriate, Distress- none acute, + morbidly obese Skin- + stasis dermatitis L lower leg Lymphadenopathy- none Head- atraumatic            Eyes- Gross vision intact, PERRLA, conjunctivae and secretions clear            Ears- Hearing, canals-normal            Nose- Clear, no-Septal dev, mucus, polyps, erosion, perforation             Throat- Mallampati III , mucosa clear , drainage- none, tonsils- atrophic Neck- flexible , trachea midline, no stridor , thyroid nl, carotid no bruit Chest - symmetrical excursion , unlabored           Heart/CV- RRR , no murmur , no gallop  , no rub, nl s1 s2                           - JVD- none , edema+1, stasis changes+ left lower leg, varices- none           Lung- + stertorous, wheeze+ coarse, cough- none , dullness-none, rub- none           Chest wall-  Abd-  Br/ Gen/ Rectal- Not done, not indicated Extrem- cyanosis- none, clubbing, none, atrophy- none, strength- nl Neuro- grossly intact to observation

## 2019-05-22 NOTE — Progress Notes (Signed)
Patient seen in the office today and instructed on use of Anoro.  Patient expressed understanding and demonstrated technique. 

## 2019-05-22 NOTE — Assessment & Plan Note (Addendum)
He reports COPD diagnosis, continuing to smoke, using a rescue inhaler occasionally. Plan- PFT, sample Anoro for trial, smoking cessation stressed

## 2019-05-23 ENCOUNTER — Telehealth: Payer: Self-pay | Admitting: Cardiology

## 2019-05-23 NOTE — Telephone Encounter (Signed)
Spoke to pt mother confirming office visit 6/12 at 8:05. Also left voicemail on pt cell phone regarding appt

## 2019-05-24 ENCOUNTER — Ambulatory Visit (INDEPENDENT_AMBULATORY_CARE_PROVIDER_SITE_OTHER): Payer: Medicare Other | Admitting: Cardiology

## 2019-05-24 ENCOUNTER — Encounter: Payer: Self-pay | Admitting: Cardiology

## 2019-05-24 ENCOUNTER — Other Ambulatory Visit: Payer: Self-pay

## 2019-05-24 VITALS — BP 160/92 | HR 106 | Temp 97.9°F | Ht 72.0 in | Wt 344.0 lb

## 2019-05-24 DIAGNOSIS — G4719 Other hypersomnia: Secondary | ICD-10-CM

## 2019-05-24 DIAGNOSIS — J449 Chronic obstructive pulmonary disease, unspecified: Secondary | ICD-10-CM

## 2019-05-24 DIAGNOSIS — M7989 Other specified soft tissue disorders: Secondary | ICD-10-CM | POA: Diagnosis not present

## 2019-05-24 DIAGNOSIS — I110 Hypertensive heart disease with heart failure: Secondary | ICD-10-CM | POA: Diagnosis not present

## 2019-05-24 MED ORDER — FUROSEMIDE 20 MG PO TABS: 20.0000 mg | ORAL_TABLET | Freq: Two times a day (BID) | ORAL | 2 refills | Status: DC

## 2019-05-24 MED ORDER — HYDRALAZINE HCL 25 MG PO TABS: 25.0000 mg | ORAL_TABLET | Freq: Two times a day (BID) | ORAL | 2 refills | Status: DC

## 2019-05-24 NOTE — Patient Instructions (Signed)
Medication Instructions:  Your physician has recommended you make the following change in your medication:   INCREASE: Furosemide 20 mg (1 Tab) twice daiy  START: Hydralazine 25 mg (1 Tab) twice daily  If you need a refill on your cardiac medications before your next appointment, please call your pharmacy.   Lab work: Your physician recommends that you return for lab work in: [place]  If you have labs (blood work) drawn today and your tests are completely normal, you will receive your results only by: Marland Kitchen MyChart Message (if you have MyChart) OR . A paper copy in the mail If you have any lab test that is abnormal or we need to change your treatment, we will call you to review the results.  Testing/Procedures: You  Will be scheduled for an ultrasound of you legs.  Follow-Up: At [facility], you and your health needs are our priority.  As part of our continuing mission to provide you with exceptional heart care, we have created designated Provider Care Teams.  These Care Teams include your primary Cardiologist (physician) and Advanced Practice Providers (APPs -  Physician Assistants and Nurse Practitioners) who all work together to provide you with the care you need, when you need it. You will need a follow up appointment in 6 weeks. Any Other Special Instructions Will Be Listed Below (If Applicable).  Hydralazine tablets What is this medicine? HYDRALAZINE (hye DRAL a zeen) is a type of vasodilator. It relaxes blood vessels, increasing the blood and oxygen supply to your heart. This medicine is used to treat high blood pressure. This medicine Kiesel be used for other purposes; ask your health care provider or pharmacist if you have questions. COMMON BRAND NAME(S): Apresoline What should I tell my health care provider before I take this medicine? They need to know if you have any of these conditions: -blood vessel disease -heart disease including angina or history of heart  attack -kidney or liver disease -systemic lupus erythematosus (SLE) -an unusual or allergic reaction to hydralazine, tartrazine dye, other medicines, foods, dyes, or preservatives -pregnant or trying to get pregnant -breast-feeding How should I use this medicine? Take this medicine by mouth with a glass of water. Follow the directions on the prescription label. Take your doses at regular intervals. Do not take your medicine more often than directed. Do not stop taking except on the advice of your doctor or health care professional. Talk to your pediatrician regarding the use of this medicine in children. Special care Signer be needed. While this drug Rickerson be prescribed for children for selected conditions, precautions do apply. Overdosage: If you think you have taken too much of this medicine contact a poison control center or emergency room at once. NOTE: This medicine is only for you. Do not share this medicine with others. What if I miss a dose? If you miss a dose, take it as soon as you can. If it is almost time for your next dose, take only that dose. Do not take double or extra doses. What Doepke interact with this medicine? -medicines for high blood pressure -medicines for mental depression This list Hochstein not describe all possible interactions. Give your health care provider a list of all the medicines, herbs, non-prescription drugs, or dietary supplements you use. Also tell them if you smoke, drink alcohol, or use illegal drugs. Some items Joslin interact with your medicine. What should I watch for while using this medicine? Visit your doctor or health care professional for regular  checks on your progress. Check your blood pressure and pulse rate regularly. Ask your doctor or health care professional what your blood pressure and pulse rate should be and when you should contact him or her. You Roettger get drowsy or dizzy. Do not drive, use machinery, or do anything that needs mental alertness until you  know how this medicine affects you. Do not stand or sit up quickly, especially if you are an older patient. This reduces the risk of dizzy or fainting spells. Alcohol Fluellen interfere with the effect of this medicine. Avoid alcoholic drinks. Do not treat yourself for coughs, colds, or pain while you are taking this medicine without asking your doctor or health care professional for advice. Some ingredients Wilbon increase your blood pressure. What side effects Rohlman I notice from receiving this medicine? Side effects that you should report to your doctor or health care professional as soon as possible: -chest pain, or fast or irregular heartbeat -fever, chills, or sore throat -numbness or tingling in the hands or feet -shortness of breath -skin rash, redness, blisters or itching -stiff or swollen joints -sudden weight gain -swelling of the feet or legs -swollen lymph glands -unusual weakness Side effects that usually do not require medical attention (report to your doctor or health care professional if they continue or are bothersome): -diarrhea, or constipation -headache -loss of appetite -nausea, vomiting This list Menzie not describe all possible side effects. Call your doctor for medical advice about side effects. You Matera report side effects to FDA at 1-800-FDA-1088. Where should I keep my medicine? Keep out of the reach of children. Store at room temperature between 15 and 30 degrees C (59 and 86 degrees F). Throw away any unused medicine after the expiration date. NOTE: This sheet is a summary. It Parlow not cover all possible information. If you have questions about this medicine, talk to your doctor, pharmacist, or health care provider.  2019 Elsevier/Gold Standard (2008-04-11 15:44:58)

## 2019-05-24 NOTE — Progress Notes (Signed)
Office visit  Date:  05/24/2019   ID:  Sedalia Muta Salehi, DOB [birth year], MRN [id number]  PCP:  Leonard Downing, MD  Cardiologist:  Shirlee More, MD  Electrophysiologist:  None   Evaluation Performed:  Follow-Up Visit  Chief Complaint:  After starting a diuretic  History of Present Illness:    Lawrence Medina is a 53 y.o. male with hypertension, hyperlipidemia, morbid obesity, chest pain and SOB last seen 05/03/19 with apparent heart failure and started on a diuretic.  His proBNP level was quite low 20 chest x-ray showed chronic peribronchial thickening consistent with primary lung disease and echocardiogram showed normal systolic function with moderate concentric left ventricular hypertrophy.  He is subsequently seen by pulmonary Dr. Baird Lyons was felt to have probable severe obstructive sleep apnea and COPD who is begun on bronchodilators and counseled on smoking cessation  Despite taking a diuretic and having a marked urine output afterwards his weight is up 6 pounds on a scale he is not adding salt but he is not controlling his diet he is eating outside the home and he is drinking colas without sodium restriction.  He is unimproved if anything he is more concerned about somnolence and he stopped driving an automobile.  He still has marked edema he short of breath with any activity but he has no orthopnea and has had no recurrent chest pain palpitation or syncope.  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  In the interim I am going to go ahead and increase his diuretic to twice a day I asked him to the best he can to sodium restrict and need to institute a second antihypertensive drug because we had withdrawn a calcium channel blocker and start him on hydralazine 25 mg twice daily.  I think the key to  this improvement here will be diagnosis and change in lifestyle and weight loss.  I will plan to see him back in 6 weeks which should match his completion of a pulmonary evaluation and he tells me Dr. Arelia Sneddon asked him to have a lower extremity duplex done regarding DVT was unable to get it scheduled and I will move ahead and order it at my office.  He does have leg pain and swelling  The patient does not have symptoms concerning for COVID-19 infection (fever, chills, cough, or new shortness of breath).    Past Medical History:  Diagnosis Date  . Allergic rhinitis   . Arthritis   . Asthma   . Complication of anesthesia   . DDD (degenerative disc disease), lumbar   . GERD (gastroesophageal reflux disease)   . Headache   . High cholesterol   . Hypertension   . PONV (postoperative nausea and vomiting)   . Seizures ([place]) 1982   x1 in 1982- unknown cause- none since  . Shortness of breath dyspnea    with exertion   Past Surgical History:  Procedure Laterality Date  . HERNIA REPAIR    . SHOULDER ARTHROSCOPY WITH ROTATOR CUFF REPAIR Left 02/23/2015   Procedure: SHOULDER ARTHROSCOPY WITH ROTATOR CUFF REPAIR BIOTENDONESIS REPAIR;  Surgeon: Marybelle Killings, MD;  Location: [place];  Service: Orthopedics;  Laterality: Left;  . SUBMANDIBULAR GLAND EXCISION Left 1975  . TOTAL KNEE ARTHROPLASTY Left 1986     Current  Meds  Medication Sig  . albuterol (VENTOLIN HFA) 108 (90 Base) MCG/ACT inhaler Inhale 2 puffs into the lungs every 4 (four) hours as needed.   Marland Kitchen aspirin EC 81 MG tablet Take 81 mg by mouth daily.  . clonazePAM (KLONOPIN) 0.5 MG tablet   . cyclobenzaprine (FLEXERIL) 10 MG tablet   . doxepin (SINEQUAN) 25 MG capsule   . fluticasone (FLONASE) 50 MCG/ACT nasal spray Place 2 sprays into both nostrils at bedtime.   . furosemide (LASIX) 20 MG tablet Take 1 tablet (20 mg total) by mouth daily.  Marland Kitchen lisinopril (ZESTRIL) 40 MG tablet Take 1 tablet by mouth daily.  Marland Kitchen loratadine (CLARITIN) 10 MG  tablet Take 10 mg by mouth daily.  Marland Kitchen lovastatin (MEVACOR) 20 MG tablet Take 20 mg by mouth daily.  . nitroGLYCERIN (NITROSTAT) 0.4 MG SL tablet Place 1 tablet (0.4 mg total) under the tongue every 5 (five) minutes as needed for chest pain.  Marland Kitchen omeprazole (PRILOSEC) 40 MG capsule Take 40 mg by mouth daily.  . potassium chloride (K-DUR) 10 MEQ tablet Take 1 tablet (10 mEq total) by mouth daily.  . sildenafil (VIAGRA) 100 MG tablet Take 100 mg by mouth as needed for erectile dysfunction.  Marland Kitchen umeclidinium-vilanterol (ANORO ELLIPTA) 62.5-25 MCG/INH AEPB Inhale 1 puff into the lungs daily.     Allergies:   Patient has no known allergies.   Social History   Tobacco Use  . Smoking status: Current Every Day Smoker    Packs/day: 0.25    Years: 34.00    Pack years: 8.50    Types: Cigarettes  . Smokeless tobacco: Never Used  Substance Use Topics  . Alcohol use: Yes    Comment: very seldom  . Drug use: Yes    Types: Marijuana     Family Hx: The patient's family history includes Cancer in his father and mother; Hypertension in his mother; Sleep apnea in his mother.  ROS:   Please see the history of present illness.     All other systems reviewed and are negative.   Prior CV studies:   The following studies were reviewed today:    Labs/Other Tests and Data Reviewed:    EKG:  No ECG reviewed.  Recent Labs: 05/03/2019: BUN 15; Creatinine, Ser 1.21; Hemoglobin 15.5; NT-Pro BNP 20; Platelets 254; Potassium 4.1; Sodium 140   Recent Lipid Panel No results found for: CHOL, TRIG, HDL, CHOLHDL, LDLCALC, LDLDIRECT  Wt Readings from Last 3 Encounters:  05/24/19 (!) 344 lb 0.6 oz (156.1 kg)  05/22/19 (!) 350 lb 3.2 oz (158.8 kg)  05/03/19 (!) 339 lb 0.6 oz (153.8 kg)     Objective:    Vital Signs:  BP (!) 160/92   Pulse (!) 106   Temp 97.9 F (36.6 C)   Ht 6' (1.829 m)   Wt (!) 344 lb 0.6 oz (156.1 kg)   SpO2 95%   BMI 46.66 kg/m    VITAL SIGNS:  reviewed GEN:  no acute  distress EYES:  sclerae anicteric, EOMI - Extraocular Movements Intact RESPIRATORY:  normal respiratory effort, symmetric expansion CARDIOVASCULAR:  no peripheral edema SKIN:  no rash, lesions or ulcers. MUSCULOSKELETAL:  no obvious deformities. NEURO:  alert and oriented x 3, no obvious focal deficit PSYCH:  normal affect He is morbidly obese still has greater than 4+ pitting edema to his thighs but no neck vein distention rales heart sounds are distant but there is no gallop or murmur ASSESSMENT & PLAN:  1. Hypertensive heart disease with heart failure for now despite the low BNP level increase his diuretic treat hypertension and await results of his sleep study for safety recheck proBNP BNP level if he is hypokalemic and his renal function is preserved I will place him on spironolactone.  He really needs to get control of his sodium intake and needs an evaluation for sleep apnea that can cause pseudo-heart failure.\ 2. Excessive daytime sleepiness being evaluated high probability of severe obstructive sleep apnea test is scheduled in the next 2 weeks I suspect he will have CPAP titration at the same time I encouraged him not to drive a car until he sees pulmonary in follow-up 3. COPD managed by pulmonary recently started on bronchodilator and he was given instruction in smoking cessation 4. Hyperlipidemia stable continue statin 5. Lower extremity swelling and pain duplex ordered will perform in my office  COVID-19 Education: The signs and symptoms of COVID-19 were discussed with the patient and how to seek care for testing (follow up with PCP or arrange E-visit).  The importance of social distancing was discussed today.  Time:   Today, I have spent 25 minutes with the patient with telehealth technology discussing the above problems.     Medication Adjustments/Labs and Tests Ordered: Current medicines are reviewed at length with the patient today.  Concerns regarding medicines are  outlined above.   Tests Ordered: No orders of the defined types were placed in this encounter.   Medication Changes: No orders of the defined types were placed in this encounter.   Follow Up:  Virtual Visit or In Person in 6 week(s)  Signed, Shirlee More, MD  05/24/2019 8:38 AM    [place]

## 2019-05-25 LAB — BASIC METABOLIC PANEL
BUN/Creatinine Ratio: 6 — ABNORMAL LOW (ref 9–20)
BUN: 7 mg/dL (ref 6–24)
CO2: 25 mmol/L (ref 20–29)
Calcium: 9 mg/dL (ref 8.7–10.2)
Chloride: 102 mmol/L (ref 96–106)
Creatinine, Ser: 1.1 mg/dL (ref 0.76–1.27)
GFR calc Af Amer: 88 mL/min/{1.73_m2} (ref >59)
GFR calc non Af Amer: 76 mL/min/{1.73_m2} (ref >59)
Glucose: 154 mg/dL — ABNORMAL HIGH (ref 65–99)
Potassium: 4.3 mmol/L (ref 3.5–5.2)
Sodium: 139 mmol/L (ref 134–144)

## 2019-05-25 LAB — PRO B NATRIURETIC PEPTIDE: NT-Pro BNP: 30 pg/mL (ref 0–121)

## 2019-06-04 ENCOUNTER — Ambulatory Visit (HOSPITAL_BASED_OUTPATIENT_CLINIC_OR_DEPARTMENT_OTHER): Payer: Medicare Other

## 2019-06-05 ENCOUNTER — Other Ambulatory Visit (HOSPITAL_COMMUNITY): Payer: Medicare Other

## 2019-06-06 ENCOUNTER — Other Ambulatory Visit (HOSPITAL_COMMUNITY)
Admission: RE | Admit: 2019-06-06 | Discharge: 2019-06-06 | Disposition: A | Discharge status: Home / Self Care | Payer: Medicare Other | Source: Ambulatory Visit | Attending: Internal Medicine | Admitting: Internal Medicine

## 2019-06-06 DIAGNOSIS — Z1159 Encounter for screening for other viral diseases: Secondary | ICD-10-CM | POA: Diagnosis present

## 2019-06-06 LAB — SARS CORONAVIRUS 2 (TAT 6-24 HRS): SARS Coronavirus 2: NEGATIVE

## 2019-06-08 ENCOUNTER — Ambulatory Visit (HOSPITAL_BASED_OUTPATIENT_CLINIC_OR_DEPARTMENT_OTHER): Payer: Medicare Other | Attending: Internal Medicine | Admitting: Internal Medicine

## 2019-06-08 ENCOUNTER — Other Ambulatory Visit: Payer: Self-pay

## 2019-06-08 DIAGNOSIS — G4733 Obstructive sleep apnea (adult) (pediatric): Secondary | ICD-10-CM

## 2019-06-10 ENCOUNTER — Other Ambulatory Visit: Payer: Self-pay

## 2019-06-14 ENCOUNTER — Emergency Department (HOSPITAL_COMMUNITY): Payer: Medicare Other

## 2019-06-14 ENCOUNTER — Other Ambulatory Visit: Payer: Self-pay

## 2019-06-14 ENCOUNTER — Inpatient Hospital Stay (HOSPITAL_COMMUNITY)
Admission: EM | Admit: 2019-06-14 | Discharge: 2019-06-25 | DRG: 207 | Disposition: A | Discharge status: Home / Self Care | Payer: Medicare Other | Attending: Internal Medicine | Admitting: Internal Medicine

## 2019-06-14 DIAGNOSIS — E662 Morbid (severe) obesity with alveolar hypoventilation: Secondary | ICD-10-CM | POA: Diagnosis present

## 2019-06-14 DIAGNOSIS — T405X1A Poisoning by cocaine, accidental (unintentional), initial encounter: Secondary | ICD-10-CM | POA: Diagnosis present

## 2019-06-14 DIAGNOSIS — Z20828 Contact with and (suspected) exposure to other viral communicable diseases: Secondary | ICD-10-CM | POA: Diagnosis present

## 2019-06-14 DIAGNOSIS — J9601 Acute respiratory failure with hypoxia: Secondary | ICD-10-CM | POA: Diagnosis present

## 2019-06-14 DIAGNOSIS — F1410 Cocaine abuse, uncomplicated: Secondary | ICD-10-CM | POA: Diagnosis present

## 2019-06-14 DIAGNOSIS — I469 Cardiac arrest, cause unspecified: Secondary | ICD-10-CM | POA: Diagnosis not present

## 2019-06-14 DIAGNOSIS — J9600 Acute respiratory failure, unspecified whether with hypoxia or hypercapnia: Secondary | ICD-10-CM

## 2019-06-14 DIAGNOSIS — G9341 Metabolic encephalopathy: Secondary | ICD-10-CM | POA: Diagnosis present

## 2019-06-14 DIAGNOSIS — F1290 Cannabis use, unspecified, uncomplicated: Secondary | ICD-10-CM | POA: Diagnosis present

## 2019-06-14 DIAGNOSIS — E162 Hypoglycemia, unspecified: Secondary | ICD-10-CM | POA: Diagnosis not present

## 2019-06-14 DIAGNOSIS — E870 Hyperosmolality and hypernatremia: Secondary | ICD-10-CM | POA: Diagnosis present

## 2019-06-14 DIAGNOSIS — N170 Acute kidney failure with tubular necrosis: Secondary | ICD-10-CM | POA: Diagnosis present

## 2019-06-14 DIAGNOSIS — R0902 Hypoxemia: Secondary | ICD-10-CM

## 2019-06-14 DIAGNOSIS — F17200 Nicotine dependence, unspecified, uncomplicated: Secondary | ICD-10-CM | POA: Diagnosis present

## 2019-06-14 DIAGNOSIS — R579 Shock, unspecified: Secondary | ICD-10-CM | POA: Diagnosis present

## 2019-06-14 DIAGNOSIS — J189 Pneumonia, unspecified organism: Secondary | ICD-10-CM | POA: Diagnosis present

## 2019-06-14 DIAGNOSIS — I517 Cardiomegaly: Secondary | ICD-10-CM | POA: Diagnosis present

## 2019-06-14 DIAGNOSIS — R402112 Coma scale, eyes open, never, at arrival to emergency department: Secondary | ICD-10-CM | POA: Diagnosis present

## 2019-06-14 DIAGNOSIS — R410 Disorientation, unspecified: Secondary | ICD-10-CM | POA: Diagnosis not present

## 2019-06-14 DIAGNOSIS — I468 Cardiac arrest due to other underlying condition: Secondary | ICD-10-CM | POA: Diagnosis present

## 2019-06-14 DIAGNOSIS — K219 Gastro-esophageal reflux disease without esophagitis: Secondary | ICD-10-CM | POA: Diagnosis present

## 2019-06-14 DIAGNOSIS — J9602 Acute respiratory failure with hypercapnia: Secondary | ICD-10-CM | POA: Diagnosis present

## 2019-06-14 DIAGNOSIS — R402212 Coma scale, best verbal response, none, at arrival to emergency department: Secondary | ICD-10-CM | POA: Diagnosis present

## 2019-06-14 DIAGNOSIS — J690 Pneumonitis due to inhalation of food and vomit: Principal | ICD-10-CM | POA: Diagnosis present

## 2019-06-14 DIAGNOSIS — E785 Hyperlipidemia, unspecified: Secondary | ICD-10-CM | POA: Diagnosis present

## 2019-06-14 DIAGNOSIS — N179 Acute kidney failure, unspecified: Secondary | ICD-10-CM | POA: Diagnosis not present

## 2019-06-14 DIAGNOSIS — S2243XA Multiple fractures of ribs, bilateral, initial encounter for closed fracture: Secondary | ICD-10-CM | POA: Diagnosis present

## 2019-06-14 DIAGNOSIS — Y848 Other medical procedures as the cause of abnormal reaction of the patient, or of later complication, without mention of misadventure at the time of the procedure: Secondary | ICD-10-CM | POA: Diagnosis present

## 2019-06-14 DIAGNOSIS — I82409 Acute embolism and thrombosis of unspecified deep veins of unspecified lower extremity: Secondary | ICD-10-CM | POA: Diagnosis not present

## 2019-06-14 DIAGNOSIS — F05 Delirium due to known physiological condition: Secondary | ICD-10-CM | POA: Diagnosis present

## 2019-06-14 DIAGNOSIS — R402312 Coma scale, best motor response, none, at arrival to emergency department: Secondary | ICD-10-CM | POA: Diagnosis present

## 2019-06-14 DIAGNOSIS — E876 Hypokalemia: Secondary | ICD-10-CM | POA: Diagnosis not present

## 2019-06-14 DIAGNOSIS — I361 Nonrheumatic tricuspid (valve) insufficiency: Secondary | ICD-10-CM | POA: Diagnosis not present

## 2019-06-14 DIAGNOSIS — I10 Essential (primary) hypertension: Secondary | ICD-10-CM | POA: Diagnosis not present

## 2019-06-14 DIAGNOSIS — G4733 Obstructive sleep apnea (adult) (pediatric): Secondary | ICD-10-CM | POA: Diagnosis not present

## 2019-06-14 DIAGNOSIS — J9690 Respiratory failure, unspecified, unspecified whether with hypoxia or hypercapnia: Secondary | ICD-10-CM

## 2019-06-14 DIAGNOSIS — Z6841 Body Mass Index (BMI) 40.0 and over, adult: Secondary | ICD-10-CM | POA: Diagnosis not present

## 2019-06-14 DIAGNOSIS — T407X1A Poisoning by cannabis (derivatives), accidental (unintentional), initial encounter: Secondary | ICD-10-CM | POA: Diagnosis present

## 2019-06-14 DIAGNOSIS — T424X1A Poisoning by benzodiazepines, accidental (unintentional), initial encounter: Secondary | ICD-10-CM | POA: Diagnosis present

## 2019-06-14 LAB — POCT I-STAT 7, (LYTES, BLD GAS, ICA,H+H)
Acid-base deficit: 6 mmol/L — ABNORMAL HIGH (ref 0.0–2.0)
Acid-base deficit: 4 mmol/L — ABNORMAL HIGH (ref 0.0–2.0)
Bicarbonate: 24.3 mmol/L (ref 20.0–28.0)
Bicarbonate: 23.6 mmol/L (ref 20.0–28.0)
Calcium, Ion: 1.14 mmol/L — ABNORMAL LOW (ref 1.15–1.40)
Calcium, Ion: 1.13 mmol/L — ABNORMAL LOW (ref 1.15–1.40)
HCT: 40 % (ref 39.0–52.0)
HCT: 43 % (ref 39.0–52.0)
Hemoglobin: 13.6 g/dL (ref 13.0–17.0)
Hemoglobin: 14.6 g/dL (ref 13.0–17.0)
O2 Saturation: 69 %
O2 Saturation: 89 %
Patient temperature: 98.6
Patient temperature: 97.5
Potassium: 3.5 mmol/L (ref 3.5–5.1)
Potassium: 3.8 mmol/L (ref 3.5–5.1)
Sodium: 138 mmol/L (ref 135–145)
Sodium: 138 mmol/L (ref 135–145)
TCO2: 27 mmol/L (ref 22–32)
TCO2: 25 mmol/L (ref 22–32)
pCO2 arterial: 73.3 mmHg (ref 32.0–48.0)
pCO2 arterial: 50.8 mmHg — ABNORMAL HIGH (ref 32.0–48.0)
pH, Arterial: 7.129 — CL (ref 7.350–7.450)
pH, Arterial: 7.272 — ABNORMAL LOW (ref 7.350–7.450)
pO2, Arterial: 49 mmHg — ABNORMAL LOW (ref 83.0–108.0)
pO2, Arterial: 62 mmHg — ABNORMAL LOW (ref 83.0–108.0)

## 2019-06-14 LAB — I-STAT CHEM 8, ED
BUN: 15 mg/dL (ref 6–20)
Calcium, Ion: 0.94 mmol/L — ABNORMAL LOW (ref 1.15–1.40)
Chloride: 105 mmol/L (ref 98–111)
Creatinine, Ser: 1.6 mg/dL — ABNORMAL HIGH (ref 0.61–1.24)
Glucose, Bld: 263 mg/dL — ABNORMAL HIGH (ref 70–99)
HCT: 46 % (ref 39.0–52.0)
Hemoglobin: 15.6 g/dL (ref 13.0–17.0)
Potassium: 2.8 mmol/L — ABNORMAL LOW (ref 3.5–5.1)
Sodium: 137 mmol/L (ref 135–145)
TCO2: 22 mmol/L (ref 22–32)

## 2019-06-14 LAB — CBC WITH DIFFERENTIAL/PLATELET
Band Neutrophils: 0 %
Basophils Absolute: 0.1 10*3/uL (ref 0.0–0.1)
Basophils Relative: 1 %
Blasts: 0 %
Eosinophils Absolute: 0.3 10*3/uL (ref 0.0–0.5)
Eosinophils Relative: 2 %
HCT: 47.3 % (ref 39.0–52.0)
Hemoglobin: 14.9 g/dL (ref 13.0–17.0)
Lymphocytes Relative: 44 %
Lymphs Abs: 6 10*3/uL — ABNORMAL HIGH (ref 0.7–4.0)
MCH: 29.4 pg (ref 26.0–34.0)
MCHC: 31.5 g/dL (ref 30.0–36.0)
MCV: 93.5 fL (ref 80.0–100.0)
Metamyelocytes Relative: 0 %
Monocytes Absolute: 0.1 10*3/uL (ref 0.1–1.0)
Monocytes Relative: 1 %
Myelocytes: 0 %
Neutro Abs: 7.1 10*3/uL (ref 1.7–7.7)
Neutrophils Relative %: 52 %
Other: 0 %
Platelets: 252 10*3/uL (ref 150–400)
Promyelocytes Relative: 0 %
RBC: 5.06 MIL/uL (ref 4.22–5.81)
RDW: 14.2 % (ref 11.5–15.5)
WBC: 13.6 10*3/uL — ABNORMAL HIGH (ref 4.0–10.5)
nRBC: 0 /100 WBC
nRBC: 0.1 % (ref 0.0–0.2)

## 2019-06-14 LAB — LIPID PANEL
Cholesterol: 207 mg/dL — ABNORMAL HIGH (ref 0–200)
HDL: 35 mg/dL — ABNORMAL LOW (ref >40)
LDL Cholesterol: 143 mg/dL — ABNORMAL HIGH (ref 0–99)
Total CHOL/HDL Ratio: 5.9 RATIO
Triglycerides: 143 mg/dL (ref <150)
VLDL: 29 mg/dL (ref 0–40)

## 2019-06-14 LAB — COMPREHENSIVE METABOLIC PANEL
ALT: 65 U/L — ABNORMAL HIGH (ref 0–44)
AST: 73 U/L — ABNORMAL HIGH (ref 15–41)
Albumin: 3.2 g/dL — ABNORMAL LOW (ref 3.5–5.0)
Alkaline Phosphatase: 103 U/L (ref 38–126)
Anion gap: 14 (ref 5–15)
BUN: 14 mg/dL (ref 6–20)
CO2: 18 mmol/L — ABNORMAL LOW (ref 22–32)
Calcium: 7.8 mg/dL — ABNORMAL LOW (ref 8.9–10.3)
Chloride: 106 mmol/L (ref 98–111)
Creatinine, Ser: 1.72 mg/dL — ABNORMAL HIGH (ref 0.61–1.24)
GFR calc Af Amer: 51 mL/min — ABNORMAL LOW (ref >60)
GFR calc non Af Amer: 44 mL/min — ABNORMAL LOW (ref >60)
Glucose, Bld: 278 mg/dL — ABNORMAL HIGH (ref 70–99)
Potassium: 2.9 mmol/L — ABNORMAL LOW (ref 3.5–5.1)
Sodium: 138 mmol/L (ref 135–145)
Total Bilirubin: 0.9 mg/dL (ref 0.3–1.2)
Total Protein: 6 g/dL — ABNORMAL LOW (ref 6.5–8.1)

## 2019-06-14 LAB — TROPONIN I (HIGH SENSITIVITY)
Troponin I (High Sensitivity): 46 ng/L — ABNORMAL HIGH (ref <18)
Troponin I (High Sensitivity): 276 ng/L (ref <18)

## 2019-06-14 LAB — APTT
aPTT: 31 seconds (ref 24–36)
aPTT: 31 seconds (ref 24–36)

## 2019-06-14 LAB — CBC
HCT: 52.9 % — ABNORMAL HIGH (ref 39.0–52.0)
Hemoglobin: 16.2 g/dL (ref 13.0–17.0)
MCH: 28.8 pg (ref 26.0–34.0)
MCHC: 30.6 g/dL (ref 30.0–36.0)
MCV: 94.1 fL (ref 80.0–100.0)
Platelets: 197 10*3/uL (ref 150–400)
RBC: 5.62 MIL/uL (ref 4.22–5.81)
RDW: 14.3 % (ref 11.5–15.5)
WBC: 16.1 10*3/uL — ABNORMAL HIGH (ref 4.0–10.5)
nRBC: 0 % (ref 0.0–0.2)

## 2019-06-14 LAB — PROTIME-INR
INR: 1.1 (ref 0.8–1.2)
INR: 1.1 (ref 0.8–1.2)
Prothrombin Time: 14.4 seconds (ref 11.4–15.2)
Prothrombin Time: 14.1 seconds (ref 11.4–15.2)

## 2019-06-14 LAB — SARS CORONAVIRUS 2 BY RT PCR (HOSPITAL ORDER, PERFORMED IN ~~LOC~~ HOSPITAL LAB): SARS Coronavirus 2: NEGATIVE

## 2019-06-14 MED ORDER — SODIUM CHLORIDE 0.9 % IV SOLN
INTRAVENOUS | Status: DC
  Administered 2019-06-15 – 2019-06-17 (×4): via INTRAVENOUS

## 2019-06-14 MED ORDER — FENTANYL CITRATE (PF) 100 MCG/2ML IJ SOLN
INTRAMUSCULAR | Status: AC
  Filled 2019-06-14: qty 2

## 2019-06-14 MED ORDER — FENTANYL CITRATE (PF) 100 MCG/2ML IJ SOLN
INTRAMUSCULAR | Status: AC
  Administered 2019-06-14: 20:00:00 100 ug via INTRAVENOUS
  Filled 2019-06-14: qty 2

## 2019-06-14 MED ORDER — ALTEPLASE (PULMONARY EMBOLISM) INFUSION
100.0000 mg | Freq: Once | INTRAVENOUS | Status: DC
  Filled 2019-06-14: qty 100

## 2019-06-14 MED ORDER — PHENYLEPHRINE HCL (PRESSORS) 10 MG/ML IV SOLN
INTRAVENOUS | Status: AC | PRN
  Administered 2019-06-14 (×2): 100 ug

## 2019-06-14 MED ORDER — PIPERACILLIN-TAZOBACTAM 3.375 G IVPB 30 MIN
3.3750 g | Freq: Once | INTRAVENOUS | Status: AC
  Administered 2019-06-15: 02:00:00 3.375 g via INTRAVENOUS
  Filled 2019-06-14: qty 50

## 2019-06-14 MED ORDER — ASPIRIN 300 MG RE SUPP
300.0000 mg | RECTAL | Status: AC
  Administered 2019-06-15: 300 mg via RECTAL
  Filled 2019-06-14: qty 1

## 2019-06-14 MED ORDER — EPINEPHRINE PF 1 MG/ML IJ SOLN
0.5000 ug/min | INTRAVENOUS | Status: DC
  Administered 2019-06-14: 20:00:00 2 ug/min via INTRAVENOUS
  Filled 2019-06-14: qty 4

## 2019-06-14 MED ORDER — PHENYLEPHRINE 40 MCG/ML (10ML) SYRINGE FOR IV PUSH (FOR BLOOD PRESSURE SUPPORT)
PREFILLED_SYRINGE | INTRAVENOUS | Status: AC
  Filled 2019-06-14: qty 10

## 2019-06-14 MED ORDER — MIDAZOLAM HCL 2 MG/2ML IJ SOLN
INTRAMUSCULAR | Status: AC
  Administered 2019-06-14: 21:00:00 2 mg via INTRAVENOUS
  Filled 2019-06-14: qty 2

## 2019-06-14 MED ORDER — MIDAZOLAM HCL 2 MG/2ML IJ SOLN
INTRAMUSCULAR | Status: AC
  Filled 2019-06-14: qty 2

## 2019-06-14 MED ORDER — MIDAZOLAM HCL 2 MG/2ML IJ SOLN
2.0000 mg | INTRAMUSCULAR | Status: DC | PRN
  Administered 2019-06-14: 21:00:00 2 mg via INTRAVENOUS

## 2019-06-14 MED ORDER — SODIUM CHLORIDE 0.9 % IV SOLN
1.0000 ug/kg/min | INTRAVENOUS | Status: DC
  Administered 2019-06-14: 23:00:00 1 ug/kg/min via INTRAVENOUS
  Filled 2019-06-14: qty 20

## 2019-06-14 MED ORDER — SODIUM CHLORIDE 0.9 % IV SOLN: 250.0000 mL | Freq: Once | INTRAVENOUS | Status: DC

## 2019-06-14 MED ORDER — SODIUM CHLORIDE 0.9 % IV SOLN
500.0000 mg | INTRAVENOUS | Status: DC
  Administered 2019-06-15 (×2): 500 mg via INTRAVENOUS
  Filled 2019-06-14 (×3): qty 500

## 2019-06-14 MED ORDER — SUCCINYLCHOLINE CHLORIDE 20 MG/ML IJ SOLN
INTRAMUSCULAR | Status: AC | PRN
  Administered 2019-06-14: 100 mg via INTRAVENOUS

## 2019-06-14 MED ORDER — ETOMIDATE 2 MG/ML IV SOLN
INTRAVENOUS | Status: AC | PRN
  Administered 2019-06-14: 30 mg via INTRAVENOUS

## 2019-06-14 MED ORDER — SODIUM CHLORIDE 0.9 % IV SOLN
INTRAVENOUS | Status: AC | PRN
  Administered 2019-06-14: 1000 mL via INTRAVENOUS

## 2019-06-14 MED ORDER — CISATRACURIUM BOLUS VIA INFUSION
0.0500 mg/kg | INTRAVENOUS | Status: DC | PRN
  Filled 2019-06-14: qty 7

## 2019-06-14 MED ORDER — MIDAZOLAM BOLUS VIA INFUSION
2.0000 mg | INTRAVENOUS | Status: DC | PRN
  Filled 2019-06-14: qty 2

## 2019-06-14 MED ORDER — FENTANYL 2500MCG IN NS 250ML (10MCG/ML) PREMIX INFUSION
100.0000 ug/h | INTRAVENOUS | Status: DC
  Administered 2019-06-14: 200 ug/h via INTRAVENOUS
  Administered 2019-06-15: 250 ug/h via INTRAVENOUS
  Administered 2019-06-15: 125 ug/h via INTRAVENOUS
  Administered 2019-06-16: 22:00:00 100 ug/h via INTRAVENOUS
  Administered 2019-06-17: 08:00:00 150 ug/h via INTRAVENOUS
  Administered 2019-06-18: 20:00:00 175 ug/h via INTRAVENOUS
  Administered 2019-06-18: 03:00:00 150 ug/h via INTRAVENOUS
  Administered 2019-06-19: 100 ug/h via INTRAVENOUS
  Administered 2019-06-20 – 2019-06-21 (×3): 150 ug/h via INTRAVENOUS
  Administered 2019-06-22: 23:00:00 100 ug/h via INTRAVENOUS
  Administered 2019-06-22: 200 ug/h via INTRAVENOUS
  Filled 2019-06-14 (×11): qty 250

## 2019-06-14 MED ORDER — CISATRACURIUM BOLUS VIA INFUSION
0.1000 mg/kg | Freq: Once | INTRAVENOUS | Status: DC
  Filled 2019-06-14: qty 14

## 2019-06-14 MED ORDER — PIPERACILLIN-TAZOBACTAM 3.375 G IVPB
3.3750 g | Freq: Three times a day (TID) | INTRAVENOUS | Status: DC
  Administered 2019-06-15 – 2019-06-16 (×4): 3.375 g via INTRAVENOUS
  Filled 2019-06-14 (×4): qty 50

## 2019-06-14 MED ORDER — FENTANYL BOLUS VIA INFUSION
50.0000 ug | INTRAVENOUS | Status: DC | PRN
  Administered 2019-06-16 – 2019-06-22 (×8): 50 ug via INTRAVENOUS
  Filled 2019-06-14: qty 50

## 2019-06-14 MED ORDER — FAMOTIDINE IN NACL 20-0.9 MG/50ML-% IV SOLN
20.0000 mg | Freq: Two times a day (BID) | INTRAVENOUS | Status: DC
  Administered 2019-06-15: 02:00:00 20 mg via INTRAVENOUS
  Filled 2019-06-14: qty 50

## 2019-06-14 MED ORDER — IOHEXOL 350 MG/ML SOLN
75.0000 mL | Freq: Once | INTRAVENOUS | Status: AC | PRN
  Administered 2019-06-14: 21:00:00 75 mL via INTRAVENOUS

## 2019-06-14 MED ORDER — MIDAZOLAM HCL 2 MG/2ML IJ SOLN
2.0000 mg | Freq: Once | INTRAMUSCULAR | Status: AC
  Administered 2019-06-14: 2 mg via INTRAVENOUS

## 2019-06-14 MED ORDER — MIDAZOLAM HCL 2 MG/2ML IJ SOLN: 2.0000 mg | INTRAMUSCULAR | Status: DC | PRN

## 2019-06-14 MED ORDER — NOREPINEPHRINE 4 MG/250ML-% IV SOLN
0.0000 ug/min | INTRAVENOUS | Status: DC
  Administered 2019-06-14: 15 ug/min via INTRAVENOUS
  Administered 2019-06-15: 02:00:00 2 ug/min via INTRAVENOUS
  Administered 2019-06-15: 3 ug/min via INTRAVENOUS
  Administered 2019-06-15: 03:00:00 10 ug/min via INTRAVENOUS
  Administered 2019-06-16: 2 ug/min via INTRAVENOUS
  Filled 2019-06-14 (×2): qty 250

## 2019-06-14 MED ORDER — HEPARIN SODIUM (PORCINE) 5000 UNIT/ML IJ SOLN
5000.0000 [IU] | Freq: Three times a day (TID) | INTRAMUSCULAR | Status: DC
  Administered 2019-06-15 – 2019-06-24 (×27): 5000 [IU] via SUBCUTANEOUS
  Filled 2019-06-14 (×26): qty 1

## 2019-06-14 MED ORDER — FENTANYL CITRATE (PF) 100 MCG/2ML IJ SOLN: 100.0000 ug | INTRAMUSCULAR | Status: DC | PRN

## 2019-06-14 MED ORDER — NOREPINEPHRINE BITARTRATE 1 MG/ML IV SOLN
INTRAVENOUS | Status: AC | PRN
  Administered 2019-06-14: 5 ug via INTRAVENOUS

## 2019-06-14 MED ORDER — MIDAZOLAM 50MG/50ML (1MG/ML) PREMIX INFUSION: 2.0000 mg/h | INTRAVENOUS | Status: DC

## 2019-06-14 MED ORDER — FENTANYL CITRATE (PF) 100 MCG/2ML IJ SOLN
100.0000 ug | INTRAMUSCULAR | Status: AC | PRN
  Administered 2019-06-14 (×3): 100 ug via INTRAVENOUS

## 2019-06-14 MED ORDER — FENTANYL CITRATE (PF) 100 MCG/2ML IJ SOLN
100.0000 ug | Freq: Once | INTRAMUSCULAR | Status: AC
  Administered 2019-06-14: 23:00:00 100 ug via INTRAVENOUS

## 2019-06-14 MED ORDER — ARTIFICIAL TEARS OPHTHALMIC OINT: 1.0000 "application " | TOPICAL_OINTMENT | Freq: Three times a day (TID) | OPHTHALMIC | Status: DC

## 2019-06-14 MED ORDER — FENTANYL CITRATE (PF) 100 MCG/2ML IJ SOLN
INTRAMUSCULAR | Status: AC
  Administered 2019-06-14: 21:00:00 100 ug via INTRAVENOUS
  Filled 2019-06-14: qty 2

## 2019-06-14 NOTE — ED Notes (Signed)
Code cool initiated 

## 2019-06-14 NOTE — ED Notes (Signed)
ED TO INPATIENT HANDOFF REPORT  ED Nurse Name and Phone #: [phone]  S Name/Age/Gender Lawrence Medina 53 y.o. male Room/Bed: TRAAC/TRAAC  Code Status   Code Status: Full Code  Home/SNF/Other Home Patient oriented to: self Is this baseline? No   Triage Complete: Triage complete  Chief Complaint post CPR  Triage Note No notes on file   Allergies No Known Allergies  Level of Care/Admitting Diagnosis ED Disposition    ED Disposition Condition [facility] Area: [place] [[id number]]  Level of Care: ICU [6]  Covid Evaluation: Confirmed COVID Negative  Diagnosis: Cardiac arrest ([place]) [427.5.ICD-9-CM]  Admitting Physician: Shellia Cleverly [[id number]]  Attending Physician: Shellia Cleverly [[id number]]  Estimated length of stay: 3 - 4 days  Certification:: I certify this patient will need inpatient services for at least 2 midnights  PT Class (Do Not Modify): Inpatient [101]  PT Acc Code (Do Not Modify): Private [1]       B Medical/Surgery History No past medical history on file.  A IV Location/Drains/Wounds Patient Lines/Drains/Airways Status   Active Line/Drains/Airways    Name:   Placement date:   Placement time:   Site:   Days:   Peripheral IV 06/14/19 Right;Left Antecubital   06/14/19    1932    Antecubital   less than 1   Peripheral IV 06/14/19 Right Forearm   06/14/19    1915    Forearm   less than 1   NG/OG Tube Nasogastric 14 Fr. Right nare   06/14/19    1933    Right nare   less than 1   Urethral Catheter Pe[place], RN Temperature probe 16 Fr.   06/14/19    2004    Temperature probe   less than 1   Airway 7.5 mm   06/14/19    1925     less than 1          Intake/Output Last 24 hours No intake or output data in the 24 hours ending 06/14/19 2257  Labs/Imaging Results for orders placed or performed during the hospital encounter of 06/14/19 (from the past 48 hour(s))  CBC with Differential/Platelet     Status: Abnormal   Collection  Time: 06/14/19  7:26 PM  Result Value Ref Range   WBC 13.6 (H) 4.0 - 10.5 K/uL   RBC 5.06 4.22 - 5.81 MIL/uL   Hemoglobin 14.9 13.0 - 17.0 g/dL   HCT 47.3 39.0 - 52.0 %   MCV 93.5 80.0 - 100.0 fL   MCH 29.4 26.0 - 34.0 pg   MCHC 31.5 30.0 - 36.0 g/dL   RDW 14.2 11.5 - 15.5 %   Platelets 252 150 - 400 K/uL   nRBC 0.1 0.0 - 0.2 %   Neutrophils Relative % 52 %   Lymphocytes Relative 44 %   Monocytes Relative 1 %   Eosinophils Relative 2 %   Basophils Relative 1 %   Band Neutrophils 0 %   Metamyelocytes Relative 0 %   Myelocytes 0 %   Promyelocytes Relative 0 %   Blasts 0 %   nRBC 0 0 /100 WBC   Other 0 %   Neutro Abs 7.1 1.7 - 7.7 K/uL   Lymphs Abs 6.0 (H) 0.7 - 4.0 K/uL   Monocytes Absolute 0.1 0.1 - 1.0 K/uL   Eosinophils Absolute 0.3 0.0 - 0.5 K/uL   Basophils Absolute 0.1 0.0 - 0.1 K/uL   WBC Morphology MILD LEFT SHIFT (  1-5% METAS, OCC MYELO, OCC BANDS)     Comment: Performed at [facility], [place] [address], [place], [place] [address]  Protime-INR     Status: None   Collection Time: 06/14/19  7:26 PM  Result Value Ref Range   Prothrombin Time 14.4 11.4 - 15.2 seconds   INR 1.1 0.8 - 1.2    Comment: (NOTE) INR goal varies based on device and disease states. Performed at [facility], [place] [address], [place], [place] [address]   APTT     Status: None   Collection Time: 06/14/19  7:26 PM  Result Value Ref Range   aPTT 31 24 - 36 seconds    Comment: Performed at [facility] [address], [place], [place] [address]  Comprehensive metabolic panel     Status: Abnormal   Collection Time: 06/14/19  7:26 PM  Result Value Ref Range   Sodium 138 135 - 145 mmol/L   Potassium 2.9 (L) 3.5 - 5.1 mmol/L   Chloride 106 98 - 111 mmol/L   CO2 18 (L) 22 - 32 mmol/L   Glucose, Bld 278 (H) 70 - 99 mg/dL   BUN 14 6 - 20 mg/dL   Creatinine, Ser 1.72 (H) 0.61 - 1.24 mg/dL   Calcium 7.8 (L) 8.9 - 10.3 mg/dL   Total Protein 6.0 (L) 6.5 - 8.1 g/dL   Albumin 3.2 (L)  3.5 - 5.0 g/dL   AST 73 (H) 15 - 41 U/L   ALT 65 (H) 0 - 44 U/L   Alkaline Phosphatase 103 38 - 126 U/L   Total Bilirubin 0.9 0.3 - 1.2 mg/dL   GFR calc non Af Amer 44 (L) >60 mL/min   GFR calc Af Amer 51 (L) >60 mL/min   Anion gap 14 5 - 15    Comment: Performed at [facility], [place] [address], [place], [place] [address]  Troponin I (High Sensitivity)     Status: Abnormal   Collection Time: 06/14/19  7:26 PM  Result Value Ref Range   Troponin I (High Sensitivity) 46 (H) <18 ng/L    Comment: (NOTE) Elevated high sensitivity troponin I (hsTnI) values and significant  changes across serial measurements [place] suggest ACS but many other  chronic and acute conditions are known to elevate hsTnI results.  Refer to the "Links" section for chest pain algorithms and additional  guidance. Performed at [name], [place] [address] [place],  [address]   Lipid panel     Status: Abnormal   Collection Time: 06/14/19  7:26 PM  Result Value Ref Range   Cholesterol 207 (H) 0 - 200 mg/dL   Triglycerides 143 <150 mg/dL   HDL 35 (L) >40 mg/dL   Total CHOL/HDL Ratio 5.9 RATIO   VLDL 29 0 - 40 mg/dL   LDL Cholesterol 143 (H) 0 - 99 mg/dL    Comment:        Total Cholesterol/HDL:CHD Risk Coronary Heart Disease Risk Table                     Men   Women  1/2 Average Risk   3.4   3.3  Average Risk       5.0   4.4  2 X Average Risk   9.6   7.1  3 X Average Risk  23.4   11.0        Use the calculated Patient Ratio above and the  CHD Risk Table to determine the patient's CHD Risk.        ATP III CLASSIFICATION (LDL):  <100     mg/dL   Optimal  100-129  mg/dL   Near or Above                    Optimal  130-159  mg/dL   Borderline  160-189  mg/dL   High  >190     mg/dL   Very High Performed at [facility] [address] [place], [place] [address]   I-stat chem 8, ED (not at [facility] or [facility])     Status: Abnormal   Collection Time: 06/14/19  7:32 PM  Result Value Ref Range    Sodium 137 135 - 145 mmol/L   Potassium 2.8 (L) 3.5 - 5.1 mmol/L   Chloride 105 98 - 111 mmol/L   BUN 15 6 - 20 mg/dL   Creatinine, Ser 1.60 (H) 0.61 - 1.24 mg/dL   Glucose, Bld 263 (H) 70 - 99 mg/dL   Calcium, Ion 0.94 (L) 1.15 - 1.40 mmol/L   TCO2 22 22 - 32 mmol/L   Hemoglobin 15.6 13.0 - 17.0 g/dL   HCT 46.0 39.0 - 52.0 %  SARS Coronavirus 2 (CEPHEID - Performed in [facility] hospital lab), Hosp Order     Status: None   Collection Time: 06/14/19  7:42 PM   Specimen: Nasopharyngeal Swab  Result Value Ref Range   SARS Coronavirus 2 NEGATIVE NEGATIVE    Comment: (NOTE) If result is NEGATIVE SARS-CoV-2 target nucleic acids are NOT DETECTED. The SARS-CoV-2 RNA is generally detectable in upper and lower  respiratory specimens during the acute phase of infection. The lowest  concentration of SARS-CoV-2 viral copies this assay can detect is 250  copies / mL. A negative result does not preclude SARS-CoV-2 infection  and should not be used as the sole basis for treatment or other  patient management decisions.  A negative result Capili occur with  improper specimen collection / handling, submission of specimen other  than nasopharyngeal swab, presence of viral mutation(s) within the  areas targeted by this assay, and inadequate number of viral copies  (<250 copies / mL). A negative result must be combined with clinical  observations, patient history, and epidemiological information. If result is POSITIVE SARS-CoV-2 target nucleic acids are DETECTED. The SARS-CoV-2 RNA is generally detectable in upper and lower  respiratory specimens dur ing the acute phase of infection.  Positive  results are indicative of active infection with SARS-CoV-2.  Clinical  correlation with patient history and other diagnostic information is  necessary to determine patient infection status.  Positive results do  not rule out bacterial infection or co-infection with other viruses. If result is PRESUMPTIVE  POSTIVE SARS-CoV-2 nucleic acids Chestang BE PRESENT.   A presumptive positive result was obtained on the submitted specimen  and confirmed on repeat testing.  While 2019 novel coronavirus  (SARS-CoV-2) nucleic acids Armenteros be present in the submitted sample  additional confirmatory testing Sales be necessary for epidemiological  and / or clinical management purposes  to differentiate between  SARS-CoV-2 and other Sarbecovirus currently known to infect humans.  If clinically indicated additional testing with an alternate test  methodology [phone]) is advised. The SARS-CoV-2 RNA is generally  detectable in upper and lower respiratory sp ecimens during the acute  phase of infection. The expected result is Negative. Fact Sheet for Patients:  StrictlyIdeas.no Fact Sheet for Healthcare  Providers: BankingDealers.co.za This test is not yet approved or cleared by the [place] and has been authorized for detection and/or diagnosis of SARS-CoV-2 by FDA under an Emergency Use Authorization (EUA).  This EUA will remain in effect (meaning this test can be used) for the duration of the COVID-19 declaration under Section 564(b)(1) of the Act, 21 U.S.C. section 360bbb-3(b)(1), unless the authorization is terminated or revoked sooner. Performed at [facility], [place] [address], [place], [place] [address]   I-STAT 7, (LYTES, BLD GAS, ICA, H+H)     Status: Abnormal   Collection Time: 06/14/19  7:47 PM  Result Value Ref Range   pH, Arterial 7.129 (LL) 7.350 - 7.450   pCO2 arterial 73.3 (HH) 32.0 - 48.0 mmHg   pO2, Arterial 49.0 (L) 83.0 - 108.0 mmHg   Bicarbonate 24.3 20.0 - 28.0 mmol/L   TCO2 27 22 - 32 mmol/L   O2 Saturation 69.0 %   Acid-base deficit 6.0 (H) 0.0 - 2.0 mmol/L   Sodium 138 135 - 145 mmol/L   Potassium 3.5 3.5 - 5.1 mmol/L   Calcium, Ion 1.14 (L) 1.15 - 1.40 mmol/L   HCT 40.0 39.0 - 52.0 %   Hemoglobin 13.6 13.0 - 17.0 g/dL    Patient temperature 98.6 F    Collection site RADIAL, ALLEN'S TEST ACCEPTABLE    Drawn by Operator    Sample type ARTERIAL    Comment NOTIFIED PHYSICIAN   I-STAT 7, (LYTES, BLD GAS, ICA, H+H)     Status: Abnormal   Collection Time: 06/14/19 10:48 PM  Result Value Ref Range   pH, Arterial 7.272 (L) 7.350 - 7.450   pCO2 arterial 50.8 (H) 32.0 - 48.0 mmHg   pO2, Arterial 62.0 (L) 83.0 - 108.0 mmHg   Bicarbonate 23.6 20.0 - 28.0 mmol/L   TCO2 25 22 - 32 mmol/L   O2 Saturation 89.0 %   Acid-base deficit 4.0 (H) 0.0 - 2.0 mmol/L   Sodium 138 135 - 145 mmol/L   Potassium 3.8 3.5 - 5.1 mmol/L   Calcium, Ion 1.13 (L) 1.15 - 1.40 mmol/L   HCT 43.0 39.0 - 52.0 %   Hemoglobin 14.6 13.0 - 17.0 g/dL   Patient temperature 97.5 F    Collection site ARTERIAL LINE    Drawn by Operator    Sample type ARTERIAL    Ct Head Wo Contrast  Result Date: 06/14/2019 CLINICAL DATA:  Altered level of consciousness. Post CPR. EXAM: CT HEAD WITHOUT CONTRAST TECHNIQUE: Contiguous axial images were obtained from the base of the skull through the vertex without intravenous contrast. COMPARISON:  None. FINDINGS: Brain: No intracranial hemorrhage, mass effect, or midline shift. No evidence of cerebral edema, gray-white differentiation is preserved. No hydrocephalus. The basilar cisterns are patent. No evidence of territorial infarct or acute ischemia. No extra-axial or intracranial fluid collection. Vascular: Slight generalized increased density in the vascular structures without focal hyperdense vessel. Skull: No fracture or focal lesion. Sinuses/Orbits: Slight mucosal thickening of ethmoid air cells Dominey be related to intubation. No sinus fluid level. Mastoid air cells are clear. Other: Bilateral parotid enlargement, partially included. IMPRESSION: 1. No acute intracranial abnormality. 2. Bilateral parotid gland enlargement is partially included. Electronically Signed   By: Keith Rake M.D.   On: 06/14/2019 21:47   Ct  Angio Chest Pe W And/or Wo Contrast  Result Date: 06/14/2019 CLINICAL DATA:  Post CPR EXAM: CT ANGIOGRAPHY CHEST WITH CONTRAST TECHNIQUE: Multidetector CT imaging of the chest was performed  using the standard protocol during bolus administration of intravenous contrast. Multiplanar CT image reconstructions and MIPs were obtained to evaluate the vascular anatomy. CONTRAST:  3mL OMNIPAQUE IOHEXOL 350 MG/ML SOLN COMPARISON:  Chest x-ray 06/14/2019 FINDINGS: Cardiovascular: Limited evaluation for pulmonary emboli secondary to poor opacification of the subsegmental and distal vessels within all lobes of the lung. There is no acute central embolus visualized. The aorta is nonaneurysmal. Heart size is normal. No pericardial effusion Mediastinum/Nodes: Midline trachea. Endotracheal tube tip terminates 2 cm superior to the carina. Esophageal tube tip terminates in the gastric body. No significantly enlarged lymph nodes. Lungs/Pleura: Extensive consolidation within the bilateral lower lobes and posterior aspects of both upper lobes. No pneumothorax. No significant effusion. Upper Abdomen: No acute abnormality. Musculoskeletal: Acute appearing right third, fourth, fifth, and sixth anterior rib fractures. Probable acute left second, fifth, sixth, and seventh anterolateral rib fractures. Irregularity and posterior sclerosis at the left fourth, fifth, sixth, seventh, eighth, ninth, tenth ribs possibly due to remote fractures. Review of the MIP images confirms the above findings. IMPRESSION: 1. Negative for acute central pulmonary embolus. Subsegmental and distal vessels are poorly opacified and evaluation for PE is therefore limited in these regions. 2. Extensive consolidations within the bilateral lower lobes and posterior aspects of the upper lobes, which Bebeau be due to multifocal pneumonia. 3. Acute appearing right third through sixth anterior rib fractures and probable acute left second and fifth through seventh  anterolateral rib fractures. Electronically Signed   By: Donavan Foil M.D.   On: 06/14/2019 21:58   Dg Chest Portable 1 View  Result Date: 06/14/2019 CLINICAL DATA:  Placement of central line. EXAM: PORTABLE CHEST 1 VIEW COMPARISON:  June 14, 2019 FINDINGS: The ETT is in good position. An NG tube terminates below today's film. A new right central line terminates in the central SVC. No pneumothorax. Bilateral pulmonary opacities persists but is significantly improved on the right but are stable on the left. No other interval changes. IMPRESSION: 1. Bilateral pulmonary opacities persist, significantly improved on the right and stable on the left. 2. Support apparatus as above. The new right central line is in good position with no pneumothorax. 3. No other abnormalities. Electronically Signed   By: Dorise Bullion III M.D   On: 06/14/2019 21:28   Dg Chest Portable 1 View  Result Date: 06/14/2019 CLINICAL DATA:  CPR EXAM: PORTABLE CHEST 1 VIEW COMPARISON:  None. FINDINGS: Endotracheal tube tip is about 2.8 cm superior to the carina. Esophageal tube tip below the diaphragm but non included. Extensive bilateral ground-glass opacity and consolidation. Possible trace right effusion. Mild cardiomegaly. No pneumothorax. Possible acute left seventh and eighth rib fractures. IMPRESSION: 1. Endotracheal tube tip about 2.8 cm superior to carina. Esophageal tube tip is below the diaphragm 2. Extensive bilateral ground-glass opacities and consolidations which Mruk be secondary to diffuse edema, pneumonia(including atypical or viral), hemorrhage, or ARDS. 3. Possible left seventh and eighth rib fractures. Electronically Signed   By: Donavan Foil M.D.   On: 06/14/2019 19:52    Pending Labs Unresulted Labs (From admission, onward)    Start     Ordered   06/15/19 0500  Blood gas, arterial  Tomorrow morning,   R     06/14/19 2213   06/15/19 0500  CBC  Tomorrow morning,   R     06/14/19 2213   06/15/19 0500  Magnesium   Tomorrow morning,   R     06/14/19 2213   06/15/19 0500  Phosphorus  Tomorrow morning,   R     06/14/19 2213   06/14/19 2300  Troponin I (High Sensitivity)  STAT Now then every 2 hours,   STAT     06/14/19 2213   06/14/19 2223  Blood culture (routine x 2)  BLOOD CULTURE X 2,   STAT     06/14/19 2222   06/14/19 2218  Culture, respiratory (non-expectorated)  Once,   STAT     06/14/19 2217   06/14/19 2218  Strep pneumoniae urinary antigen  Once,   STAT     06/14/19 2217   06/14/19 2218  Legionella Pneumophila Serogp 1 Ur Ag  Once,   STAT     06/14/19 2217   06/14/19 2213  Arterial Blood Gas  ONCE - STAT,   STAT    Comments: Post intubation    06/14/19 2213   06/14/19 2213  Arterial Blood Gas PRN  ONCE - STAT,   R     06/14/19 2213   06/14/19 2210  Protime-INR now and repeat in 8 hours  Now then every 8 hours,   R (with STAT occurrences)     06/14/19 2213   06/14/19 2210  APTT now and repeat in 8 hours  Now then every 8 hours,   R (with STAT occurrences)     06/14/19 2213   06/14/19 2210  CBC  (heparin)  Once,   STAT    Comments: Baseline for heparin therapy IF NOT ALREADY DRAWN.  Notify MD if PLT < 100 K.    06/14/19 2213   06/14/19 1926  Troponin I (High Sensitivity)  (Stemi Panel (PNL))  STAT Now then every 2 hours,   STAT     06/14/19 1926          Vitals/Pain Today's Vitals   06/14/19 2057 06/14/19 2127 06/14/19 2130 06/14/19 2145  BP:    117/78  Pulse: (!) 102 100 99 95  Resp: 18 20 20 20   Temp:  (!) 97.5 F (36.4 C) (!) 97.4 F (36.3 C) (!) 97.4 F (36.3 C)  SpO2: 93% 96% 97% 97%  Weight:      Height:        Isolation Precautions No active isolations  Medications Medications  phenylephrine 0.4-0.9 MG/10ML-% injection (has no administration in time range)  EPINEPHrine (ADRENALIN) 4 mg in dextrose 5 % 250 mL (0.016 mg/mL) infusion (2 mcg/min Intravenous Rate/Dose Change 06/14/19 2130)  fentaNYL (SUBLIMAZE) injection 100 mcg (has no administration in time range)   midazolam (VERSED) injection 2 mg (2 mg Intravenous Given 06/14/19 2120)  midazolam (VERSED) injection 2 mg (has no administration in time range)  0.9 %  sodium chloride infusion (has no administration in time range)  norepinephrine (LEVOPHED) 4mg  in 22mL premix infusion (10 mcg/min Intravenous Rate/Dose Change 06/14/19 2130)  azithromycin (ZITHROMAX) 500 mg in sodium chloride 0.9 % 250 mL IVPB (has no administration in time range)  aspirin suppository 300 mg (has no administration in time range)  cisatracurium (NIMBEX) bolus via infusion 13.2 mg (has no administration in time range)    And  cisatracurium (NIMBEX) 200 mg in sodium chloride 0.9 % 200 mL (1 mg/mL) infusion (has no administration in time range)    And  cisatracurium (NIMBEX) bolus via infusion 6.6 mg (has no administration in time range)  artificial tears (LACRILUBE) ophthalmic ointment 1 application (has no administration in time range)  heparin injection 5,000 Units (has no administration in time range)  0.9 %  sodium chloride infusion (  has no administration in time range)  fentaNYL (SUBLIMAZE) injection 100 mcg (has no administration in time range)  fentaNYL 2534mcg in NS 282mL (37mcg/ml) infusion-PREMIX (has no administration in time range)  fentaNYL (SUBLIMAZE) bolus via infusion 50 mcg (has no administration in time range)  midazolam (VERSED) 50 mg/50 mL (1 mg/mL) premix infusion (has no administration in time range)  midazolam (VERSED) bolus via infusion 2 mg (has no administration in time range)  famotidine (PEPCID) IVPB 20 mg premix (has no administration in time range)  piperacillin-tazobactam (ZOSYN) IVPB 3.375 g (has no administration in time range)  piperacillin-tazobactam (ZOSYN) IVPB 3.375 g (has no administration in time range)  0.9 %  sodium chloride infusion ( Intravenous Stopped 06/14/19 2031)  phenylephrine (NEO-SYNEPHRINE) injection (100 mcg  Given 06/14/19 1924)  etomidate (AMIDATE) injection (30 mg Intravenous  Given 06/14/19 1923)  succinylcholine (ANECTINE) injection (100 mg Intravenous Given 06/14/19 1924)  norepinephrine (LEVOPHED) injection (5 mcg Intravenous Given 06/14/19 1925)  fentaNYL (SUBLIMAZE) injection 100 mcg (100 mcg Intravenous Given 06/14/19 2046)  iohexol (OMNIPAQUE) 350 MG/ML injection 75 mL (75 mLs Intravenous Contrast Given 06/14/19 2124)  midazolam (VERSED) injection 2 mg (2 mg Intravenous Given 06/14/19 2216)    Mobility walks     Focused Assessments Cardiac Assessment Handoff:  Cardiac Rhythm: Normal sinus rhythm No results found for: CKTOTAL, CKMB, CKMBINDEX, TROPONINI No results found for: DDIMER Does the Patient currently have chest pain? No   , Neuro Assessment Handoff:  Swallow screen pass? Yes  Cardiac Rhythm: Normal sinus rhythm       Neuro Assessment: Exceptions to WDL Neuro Checks:      Last Documented NIHSS Modified Score:   Has TPA been given? No If patient is a Neuro Trauma and patient is going to OR before floor call report to [place] nurse: [phone] or [phone]  , Pulmonary Assessment Handoff:  Lung sounds: Bilateral Breath Sounds: Rhonchi L Breath Sounds: Rhonchi O2 Device: Ventilator        R Recommendations: See Admitting Provider Note  Report given to:   Additional Notes:

## 2019-06-14 NOTE — Progress Notes (Signed)
Pharmacy Antibiotic Note  Lawrence Medina is a 53 y.o. male admitted on 06/14/2019 with pneumonia and sepsis.  Pharmacy has been consulted for Zosyn dosing.  Plan: Zosyn 3.375g IV q8h (4 hour infusion).  Monitor renal function and C&S  Height: 5\' 11"  (180.3 cm) Weight: 290 lb (131.5 kg) IBW/kg (Calculated) : 75.3  Temp (24hrs), Avg:97.7 F (36.5 C), Min:97.4 F (36.3 C), Max:97.9 F (36.6 C)  Recent Labs  Lab 06/14/19 1926 06/14/19 1932  WBC 13.6*  --   CREATININE 1.72* 1.60*    Estimated Creatinine Clearance: 73 mL/min (A) (by C-G formula based on SCr of 1.6 mg/dL (H)).    No Known Allergies  Antimicrobials this admission: Azithro 7/3 >>  Zosyn 7/3 >>   Thank you for allowing pharmacy to be a part of this patient's care.  Alanda Slim, PharmD, [facility] Clinical Pharmacist Please see AMION for all Pharmacists' Contact Phone Numbers 06/14/2019, 10:47 PM

## 2019-06-14 NOTE — ED Notes (Signed)
FAMILY IN CONSULTATION A

## 2019-06-14 NOTE — Procedures (Signed)
Arterial Catheter Insertion Procedure Note Lawrence Medina [id number] 1966/02/15  Procedure: Insertion of Arterial Catheter  Indications: Blood pressure monitoring  Procedure Details Consent: Unable to obtain consent because of emergent medical necessity. Time Out: Verified patient identification, verified procedure, site/side was marked, verified correct patient position, special equipment/implants available, medications/allergies/relevent history reviewed, required imaging and test results available.  Performed  Maximum sterile technique was used including antiseptics, cap, gloves, gown, hand hygiene, mask and sheet. Skin prep: Chlorhexidine; local anesthetic administered 20 gauge catheter was inserted into left radial artery using the Seldinger technique. ULTRASOUND GUIDANCE USED: NO Evaluation Blood flow good; BP tracing good. Complications: No apparent complications.   Myrtie Neither 06/14/2019

## 2019-06-14 NOTE — Procedures (Signed)
Intubation Procedure Note Lawrence Medina [id number] [birth year]  Procedure: Intubation Indications: Respiratory insufficiency  Procedure Details Consent: Unable to obtain consent because of emergent medical necessity. Time Out: Verified patient identification, verified procedure, site/side was marked, verified correct patient position, special equipment/implants available, medications/allergies/relevent history reviewed, required imaging and test results available.  Performed  Maximum sterile technique was used including antiseptics, cap, gloves, gown, hand hygiene and mask.  MAC and 3    Evaluation Hemodynamic Status: Persistent hypotension treated with pressors; O2 sats: hypoxic pre, during, and post procedure Patient's Current Condition: stable Complications: No apparent complications Patient did tolerate procedure well. Chest X-ray ordered to verify placement.  CXR: pending.   Myrtie Neither 06/14/2019

## 2019-06-14 NOTE — Progress Notes (Signed)
Patient transported on ventilator to CT and back to room with no complications. Vitals stable.
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
## 2019-06-14 NOTE — ED Notes (Signed)
Per MD, pt initially to be r/o for PE. Per famliy, pt's left leg has been swelling and pt has been c/o SOB x1 week and clutching chest intermittently. Pt to be TPA'd for PE.

## 2019-06-14 NOTE — Code Documentation (Signed)
Family updated as to patient's status.
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
## 2019-06-14 NOTE — Progress Notes (Signed)
Patient transported to 2H04 on ventilator with no complications. Vitals stable throughout. Receiving RT aware of patient.

## 2019-06-14 NOTE — H&P (Addendum)
NAME:  Lawrence Medina, MRN:  [id number], DOB:  02/08/1966, LOS: 0 ADMISSION DATE:  06/14/2019, CONSULTATION DATE:  06/14/2019 REFERRING MD:  EDP, CHIEF COMPLAINT:  Post arrest    Brief History   53yo male with hx HTN and untreated OSA presented 7/3 to ER after witnessed cardiac arrest.  He was reportedly riding in the car with family when he suddenly complained of feeling ill and then became unresponsive and pulseless.  He received family CPR until fire arrived and AED did NOT shock.  On EMS arrival he was in PEA and received at least 4 rounds of CPR with multiple epi's.  He was intubated in the field and has remained hypoxic with sats initially 60-70.  In ER he has required epi and norepi gtts.  Per family he has been c/o RLE pain for several days prior to admit. Initial plan was for systemic thrombolytics for presumed PE but there was some question as to existing AAA and as he was becoming a bit more stable ER opted to wait and obtain CTA to confirm.  CTA chest results pending at this time, PCCM called to admit.   History of present illness   53yo male with hx HTN and untreated OSA presented 7/3 to ER after witnessed cardiac arrest.  He was reportedly riding in the car with family when he suddenly complained of feeling ill and then became unresponsive and pulseless.  He received family CPR until fire arrived and AED did NOT shock.  On EMS arrival he was in PEA and received at least 4 rounds of CPR with multiple epi's.  He was intubated in the field and has remained hypoxic with sats initially 60-70.  In ER he has required epi and norepi gtts.  Per family he has been c/o RLE pain for several days prior to admit. Initial plan was for systemic thrombolytics for presumed PE but there was some question as to existing AAA and as he was becoming a bit more stable ER opted to wait and obtain CTA to confirm.  CTA chest revealed bilateral consolidations concerning for extensive CAP.  On further questioning, pt's  girlfriend ("common law wife" does admit that he has been coughing for some time now.  States he was treated a few weeks ago with antibiotics and an inhaler for bronchitis and that it did get some better but that he still coughs a lot and sounds very congested.  He was recently told that he needs CPAP but has not yet set this up at home.  She states that he is very sleepy all the time and regularly over the last several days is falling asleep while having a conversation with them.  Past Medical History  HTN  GERD    Significant Hospital Events     Consults:    Procedures:  R IJ CVL (ED) 7/3>>> ETT 7/3>>>  Significant Diagnostic Tests:  CT head 7/3>>>neg acute  CTA chest 7/3>>> neg acute central  2D echo 7/3>>>  Micro Data:   Antimicrobials:     Interim history/subjective:  Seen in the emergency room.  Blood pressure slightly more stable, weaning epi drip.  Objective   Blood pressure 117/78, pulse 95, temperature (!) 97.4 F (36.3 C), resp. rate 20, height 5\' 11"  (1.803 m), weight 131.5 kg, SpO2 97 %.    Vent Mode: PRVC FiO2 (%):  [100 %] 100 % Set Rate:  [18 bmp-20 bmp] 20 bmp Vt Set:  [237 mL] 620 mL PEEP:  [10 cmH20]  [place] Pressure:  [26 cmH20] 26 cmH20  No intake or output data in the 24 hours ending 06/14/19 2248 Filed Weights   06/14/19 1934  Weight: 131.5 kg    Examination: General: Obese, chronically ill-appearing male, no acute distress on the vent HENT: ETT, MM moist Lungs: Respirations are even and nonlabored on the vent.  Few scattered rhonchi throughout Cardiovascular: Distant, no audible murmur, remains on moderate dose Levophed drip and low-dose epi drip Abdomen: Obese, soft, negative bowel sounds Extremities: Warm and dry, BLE venous stasis, 1+ BLE edema symmetric Neuro: Sedated on vent, just received Versed push, disconjugate gaze, pupils pinpoint.  Positive corneal reflex, diminished gag  Resolved Hospital Problem list      Assessment & Plan:   PEA arrest-unclear etiology suspect multifactorial in the setting of probable bilateral pneumonia versus aspiration pneumonia with underlying untreated OSA. Shock Plan- Hypothermia protocol Echocardiogram Consider cardiology input in a.m. Continue Levophed drip Wean epi drip to off as able Monitor CVPs Trend troponins  Acute respiratory failure s/p cardiac arrest Untreated OSA/OHS PLAN -  Vent support - 8cc/kg  F/u CXR  F/u ABG Will need nightly CPAP once extubated  Pneumonia- the aspiration pneumonia Plan- Zosyn, azithromycin Panculture Tracheal aspirate Trend lactate  AMS-post cardiac arrest with 30-minute downtime Plan- Follow-up EEG, head CT Consider neuro input for assistance with prognostication once rewarming is complete Fentanyl, Versed drips  Multiple anterior rib fractures- post CPR Plan- Supportive care Pain management  Best practice:  Diet: N.p.o. Pain/Anxiety/Delirium protocol (if indicated): As per hypothermia protocol VAP protocol (if indicated): Ordered DVT prophylaxis: Subcu heparin GI prophylaxis: Pepcid Glucose control: Sliding scale insulin Mobility: bedRest Code Status: Full Family Communication: Updated girlfriend and patient's sister at length at bedside 7/3 Disposition: ICU  Labs   CBC: Recent Labs  Lab 06/14/19 1926 06/14/19 1932 06/14/19 1947  WBC 13.6*  --   --   NEUTROABS 7.1  --   --   HGB 14.9 15.6 13.6  HCT 47.3 46.0 40.0  MCV 93.5  --   --   PLT 252  --   --     Basic Metabolic Panel: Recent Labs  Lab 06/14/19 1926 06/14/19 1932 06/14/19 1947  NA 138 137 138  K 2.9* 2.8* 3.5  CL 106 105  --   CO2 18*  --   --   GLUCOSE 278* 263*  --   BUN 14 15  --   CREATININE 1.72* 1.60*  --   CALCIUM 7.8*  --   --    GFR: Estimated Creatinine Clearance: 73 mL/min (A) (by C-G formula based on SCr of 1.6 mg/dL (H)). Recent Labs  Lab 06/14/19 1926  WBC 13.6*    Liver Function Tests: Recent  Labs  Lab 06/14/19 1926  AST 73*  ALT 65*  ALKPHOS 103  BILITOT 0.9  PROT 6.0*  ALBUMIN 3.2*   No results for input(s): LIPASE, AMYLASE in the last 168 hours. No results for input(s): AMMONIA in the last 168 hours.  ABG    Component Value Date/Time   PHART 7.129 (LL) 06/14/2019 1947   PCO2ART 73.3 (HH) 06/14/2019 1947   PO2ART 49.0 (L) 06/14/2019 1947   HCO3 24.3 06/14/2019 1947   TCO2 27 06/14/2019 1947   ACIDBASEDEF 6.0 (H) 06/14/2019 1947   O2SAT 69.0 06/14/2019 1947     Coagulation Profile: Recent Labs  Lab 06/14/19 1926  INR 1.1    Cardiac Enzymes: No results for input(s): CKTOTAL, CKMB, CKMBINDEX, TROPONINI in  the last 168 hours.  HbA1C: No results found for: HGBA1C  CBG: No results for input(s): GLUCAP in the last 168 hours.  Review of Systems:   As per HPI above obtained from ER staff and family otherwise neg  Past Medical History  He,  has no past medical history on file.   Surgical History     Social History      Family History   His family history is not on file.   Allergies No Known Allergies   Home Medications  Prior to Admission medications   Not on File     Critical care time: 28mins     Patient seen and examined case discussed with family at bedside along with Mrs. Whiteheart.  Agree with the above assessment and plan.  Nickolas Madrid, NP 06/14/2019  10:48 PM Pager: [phone] or [phone]

## 2019-06-14 NOTE — ED Notes (Signed)
TPA on hold until determination regarding pt's hx of abdominal aortic aneurysm.

## 2019-06-14 NOTE — ED Notes (Signed)
Critical Care at bedside.  

## 2019-06-14 NOTE — ED Notes (Signed)
Family here and in Consult Room A

## 2019-06-15 ENCOUNTER — Encounter (HOSPITAL_COMMUNITY): Payer: Self-pay

## 2019-06-15 ENCOUNTER — Inpatient Hospital Stay (HOSPITAL_COMMUNITY): Payer: Medicare Other

## 2019-06-15 ENCOUNTER — Other Ambulatory Visit: Payer: Self-pay

## 2019-06-15 DIAGNOSIS — I82409 Acute embolism and thrombosis of unspecified deep veins of unspecified lower extremity: Secondary | ICD-10-CM

## 2019-06-15 DIAGNOSIS — I361 Nonrheumatic tricuspid (valve) insufficiency: Secondary | ICD-10-CM

## 2019-06-15 DIAGNOSIS — I469 Cardiac arrest, cause unspecified: Secondary | ICD-10-CM

## 2019-06-15 LAB — POCT I-STAT 7, (LYTES, BLD GAS, ICA,H+H)
Acid-base deficit: 3 mmol/L — ABNORMAL HIGH (ref 0.0–2.0)
Acid-base deficit: 5 mmol/L — ABNORMAL HIGH (ref 0.0–2.0)
Bicarbonate: 21.6 mmol/L (ref 20.0–28.0)
Bicarbonate: 21.7 mmol/L (ref 20.0–28.0)
Calcium, Ion: 1.09 mmol/L — ABNORMAL LOW (ref 1.15–1.40)
Calcium, Ion: 1.11 mmol/L — ABNORMAL LOW (ref 1.15–1.40)
HCT: 38 % — ABNORMAL LOW (ref 39.0–52.0)
HCT: 41 % (ref 39.0–52.0)
Hemoglobin: 12.9 g/dL — ABNORMAL LOW (ref 13.0–17.0)
Hemoglobin: 13.9 g/dL (ref 13.0–17.0)
O2 Saturation: 92 %
O2 Saturation: 99 %
Patient temperature: 36.7
Patient temperature: 37.7
Potassium: 3.6 mmol/L (ref 3.5–5.1)
Potassium: 3.6 mmol/L (ref 3.5–5.1)
Sodium: 140 mmol/L (ref 135–145)
Sodium: 141 mmol/L (ref 135–145)
TCO2: 23 mmol/L (ref 22–32)
TCO2: 23 mmol/L (ref 22–32)
pCO2 arterial: 36.2 mmHg (ref 32.0–48.0)
pCO2 arterial: 43.5 mmHg (ref 32.0–48.0)
pH, Arterial: 7.304 — ABNORMAL LOW (ref 7.350–7.450)
pH, Arterial: 7.386 (ref 7.350–7.450)
pO2, Arterial: 134 mmHg — ABNORMAL HIGH (ref 83.0–108.0)
pO2, Arterial: 68 mmHg — ABNORMAL LOW (ref 83.0–108.0)

## 2019-06-15 LAB — CBC
HCT: 40.1 % (ref 39.0–52.0)
Hemoglobin: 13 g/dL (ref 13.0–17.0)
MCH: 28.8 pg (ref 26.0–34.0)
MCHC: 32.4 g/dL (ref 30.0–36.0)
MCV: 88.9 fL (ref 80.0–100.0)
Platelets: 221 10*3/uL (ref 150–400)
RBC: 4.51 MIL/uL (ref 4.22–5.81)
RDW: 14.4 % (ref 11.5–15.5)
WBC: 9.2 10*3/uL (ref 4.0–10.5)
nRBC: 0 % (ref 0.0–0.2)

## 2019-06-15 LAB — PROTIME-INR
INR: 1.2 (ref 0.8–1.2)
Prothrombin Time: 14.6 seconds (ref 11.4–15.2)

## 2019-06-15 LAB — GLUCOSE, CAPILLARY
Glucose-Capillary: 138 mg/dL — ABNORMAL HIGH (ref 70–99)
Glucose-Capillary: 93 mg/dL (ref 70–99)
Glucose-Capillary: 111 mg/dL — ABNORMAL HIGH (ref 70–99)
Glucose-Capillary: 113 mg/dL — ABNORMAL HIGH (ref 70–99)
Glucose-Capillary: 122 mg/dL — ABNORMAL HIGH (ref 70–99)
Glucose-Capillary: 205 mg/dL — ABNORMAL HIGH (ref 70–99)

## 2019-06-15 LAB — HEMOGLOBIN A1C
Hgb A1c MFr Bld: 5.7 % — ABNORMAL HIGH (ref 4.8–5.6)
Mean Plasma Glucose: 116.89 mg/dL

## 2019-06-15 LAB — APTT: aPTT: 32 seconds (ref 24–36)

## 2019-06-15 LAB — MAGNESIUM: Magnesium: 2 mg/dL (ref 1.7–2.4)

## 2019-06-15 LAB — ECHOCARDIOGRAM COMPLETE
Height: 71 in
Weight: 5636.72 oz

## 2019-06-15 LAB — TROPONIN I (HIGH SENSITIVITY)
Troponin I (High Sensitivity): 417 ng/L (ref <18)
Troponin I (High Sensitivity): 372 ng/L (ref <18)

## 2019-06-15 LAB — MRSA PCR SCREENING: MRSA by PCR: NEGATIVE

## 2019-06-15 LAB — STREP PNEUMONIAE URINARY ANTIGEN: Strep Pneumo Urinary Antigen: NEGATIVE

## 2019-06-15 LAB — PHOSPHORUS: Phosphorus: 3 mg/dL (ref 2.5–4.6)

## 2019-06-15 MED ORDER — CHLORHEXIDINE GLUCONATE CLOTH 2 % EX PADS
6.0000 | MEDICATED_PAD | Freq: Every day | CUTANEOUS | Status: DC
  Administered 2019-06-15 – 2019-06-18 (×4): 6 via TOPICAL

## 2019-06-15 MED ORDER — INSULIN ASPART 100 UNIT/ML ~~LOC~~ SOLN
0.0000 [IU] | SUBCUTANEOUS | Status: DC
  Administered 2019-06-15: 21:00:00 2 [IU] via SUBCUTANEOUS
  Administered 2019-06-15: 8 [IU] via SUBCUTANEOUS
  Administered 2019-06-16: 4 [IU] via SUBCUTANEOUS
  Administered 2019-06-16 (×2): 2 [IU] via SUBCUTANEOUS
  Administered 2019-06-16: 4 [IU] via SUBCUTANEOUS
  Administered 2019-06-17 – 2019-06-25 (×17): 2 [IU] via SUBCUTANEOUS

## 2019-06-15 MED ORDER — DEXMEDETOMIDINE HCL IN NACL 200 MCG/50ML IV SOLN
0.4000 ug/kg/h | INTRAVENOUS | Status: DC
  Administered 2019-06-15 (×2): 0.4 ug/kg/h via INTRAVENOUS
  Administered 2019-06-15: 01:00:00 0.5 ug/kg/h via INTRAVENOUS
  Administered 2019-06-15: 07:00:00 0.4 ug/kg/h via INTRAVENOUS
  Administered 2019-06-15: 23:00:00 0.9 ug/kg/h via INTRAVENOUS
  Filled 2019-06-15 (×2): qty 50
  Filled 2019-06-15: qty 100
  Filled 2019-06-15 (×2): qty 50

## 2019-06-15 MED ORDER — VITAL HIGH PROTEIN PO LIQD: 1000.0000 mL | ORAL | Status: DC

## 2019-06-15 MED ORDER — DEXMEDETOMIDINE HCL IN NACL 400 MCG/100ML IV SOLN
0.4000 ug/kg/h | INTRAVENOUS | Status: DC
  Administered 2019-06-16: 0.7 ug/kg/h via INTRAVENOUS
  Administered 2019-06-16 (×3): 0.9 ug/kg/h via INTRAVENOUS
  Filled 2019-06-15 (×6): qty 100

## 2019-06-15 MED ORDER — VITAL HIGH PROTEIN PO LIQD
1000.0000 mL | ORAL | Status: DC
  Administered 2019-06-15 – 2019-06-17 (×4): 1000 mL

## 2019-06-15 MED ORDER — INSULIN GLARGINE 100 UNIT/ML ~~LOC~~ SOLN
10.0000 [IU] | Freq: Two times a day (BID) | SUBCUTANEOUS | Status: DC
  Administered 2019-06-15 – 2019-06-25 (×19): 10 [IU] via SUBCUTANEOUS
  Filled 2019-06-15 (×23): qty 0.1

## 2019-06-15 MED ORDER — CHLORHEXIDINE GLUCONATE 0.12% ORAL RINSE (MEDLINE KIT)
15.0000 mL | Freq: Two times a day (BID) | OROMUCOSAL | Status: DC
  Administered 2019-06-15 – 2019-06-23 (×19): 15 mL via OROMUCOSAL

## 2019-06-15 MED ORDER — IPRATROPIUM-ALBUTEROL 0.5-2.5 (3) MG/3ML IN SOLN
3.0000 mL | Freq: Four times a day (QID) | RESPIRATORY_TRACT | Status: DC
  Administered 2019-06-15 – 2019-06-23 (×31): 3 mL via RESPIRATORY_TRACT
  Filled 2019-06-15 (×28): qty 3

## 2019-06-15 MED ORDER — PANTOPRAZOLE SODIUM 40 MG IV SOLR
40.0000 mg | Freq: Every day | INTRAVENOUS | Status: DC
  Administered 2019-06-15: 22:00:00 40 mg via INTRAVENOUS
  Filled 2019-06-15: qty 40

## 2019-06-15 MED ORDER — ORAL CARE MOUTH RINSE
15.0000 mL | OROMUCOSAL | Status: DC
  Administered 2019-06-15 – 2019-06-23 (×78): 15 mL via OROMUCOSAL

## 2019-06-15 NOTE — Progress Notes (Signed)
EEG Completed; Results Pending  

## 2019-06-15 NOTE — Progress Notes (Addendum)
NAME:  Lawrence Medina, MRN:  [id number], DOB:  June 23, 1966, LOS: 1 ADMISSION DATE:  06/14/2019, CONSULTATION DATE:  06/14/2019 REFERRING MD:  EDP, CHIEF COMPLAINT:  Post arrest    Brief History   53yo male with hx HTN and untreated OSA presented 7/3 to ER after witnessed cardiac arrest.  He was reportedly riding in the car with family when he suddenly complained of feeling ill and then became unresponsive and pulseless.  He received family CPR until fire arrived and AED did NOT shock.  On EMS arrival he was in PEA and received at least 4 rounds of CPR with multiple epi's.  He was intubated in the field and has remained hypoxic with sats initially 60-70.  In ER he has required epi and norepi gtts.  Per family he has been c/o RLE pain for several days prior to admit. Initial plan was for systemic thrombolytics for presumed PE but there was some question as to existing AAA and as he was becoming a bit more stable ER opted to wait and obtain CTA to confirm.  CTA chest results pending at this time, PCCM called to admit.   History of present illness   53yo male with hx HTN and untreated OSA presented 7/3 to ER after witnessed cardiac arrest.  He was reportedly riding in the car with family when he suddenly complained of feeling ill and then became unresponsive and pulseless.  He received family CPR until fire arrived and AED did NOT shock.  On EMS arrival he was in PEA and received at least 4 rounds of CPR with multiple epi's.  He was intubated in the field and has remained hypoxic with sats initially 60-70.  In ER he has required epi and norepi gtts.  Per family he has been c/o RLE pain for several days prior to admit. Initial plan was for systemic thrombolytics for presumed PE but there was some question as to existing AAA and as he was becoming a bit more stable ER opted to wait and obtain CTA to confirm.  CTA chest revealed bilateral consolidations concerning for extensive CAP.  On further questioning, pt's  girlfriend ("common law wife" does admit that he has been coughing for some time now.  States he was treated a few weeks ago with antibiotics and an inhaler for bronchitis and that it did get some better but that he still coughs a lot and sounds very congested.  He was recently told that he needs CPAP but has not yet set this up at home.  She states that he is very sleepy all the time and regularly over the last several days is falling asleep while having a conversation with them.  Past Medical History  HTN  GERD    Significant Hospital Events   7/3 admitted  Consults:  NA  Procedures:  R IJ CVL (ED) 7/3>>> ETT 7/3>>>  Significant Diagnostic Tests:  CT head 7/3>>>neg acute  CTA chest 7/3>>>multifocal PNA  2D echo 7/3>>>  Micro Data:   Antimicrobials:   Zosyn, azithromycin 7/3>>  Interim history/subjective:  Shakes head no when asked if he's feeling okay.  Pressors, vent requirements improving.  Objective   Blood pressure (!) 90/54, pulse 72, temperature (!) 100.6 F (38.1 C), temperature source Core, resp. rate (!) 24, height 5\' 11"  (1.803 m), weight (!) 159.8 kg, SpO2 98 %.    Vent Mode: PRVC FiO2 (%):  [70 %-100 %] 70 % Set Rate:  [18 bmp-24 bmp] 24 bmp Vt Set:  [  620 mL] 620 mL PEEP:  [10 cmH20] 10 cmH20 Plateau Pressure:  [22 cmH20-27 cmH20] 26 cmH20   Intake/Output Summary (Last 24 hours) at 06/15/2019 0756 Last data filed at 06/15/2019 0700 Gross per 24 hour  Intake 1414.29 ml  Output 450 ml  Net 964.29 ml   Filed Weights   06/14/19 1934 06/15/19 0000  Weight: 131.5 kg (!) 159.8 kg    Examination: GEN: obese elderly man in NAD HEENT: ETT in place, moderate secretions CV: RRR, ext warm PULM: Diminished with rhonci bilaterally GI: Protuberant, hypoactive BS EXT: R>L pitting edema NEURO: Moves all 4 ext briskly to command PSYCH: RASS -1 SKIN: No rashes, central line CDI  Labs reviewed, mild troponin leak  Resolved Hospital Problem list     Assessment  & Plan:  # PEA arrest: etiology unclear, no large PE, initial EKG and trop does not explain, multilobar PNA on CT likely just aspiration but I guess could have preceded arrest.  Echo will be helpful I wonder about how his RV looks.  Luckily he is neurologically doing pretty well following commands briskly. # Bilateral pneumonitis, high supicion this is aspiration # Shock related to aspiration and arrest, improving # Acute hypoxemic resp failure on vent related to arrest and aspiration # Bilateral rib fractures related to CPR # Likely undiagnosed OSA and metabolic syndrome # Troponin leak suspicion for demand ischemia   - Continue vent support, wean O2/PEEP as able - Zosyn/azithromycin fine for now, follow up urine Ag, tracheal aspirate - Consider SAT/SBT once pressures and vent requirements are more reasonable - f/u echo  Best practice:  Diet: Dietician consult for TF Pain/Anxiety/Delirium protocol (if indicated): fentanyl, precedex VAP protocol (if indicated): Ordered DVT prophylaxis: Subcu heparin GI prophylaxis: Pepcid Glucose control: Sliding scale insulin + lantus Mobility: bedRest Code Status: Full Family Communication: will call today Disposition: ICU  The patient is critically ill with multiple organ systems failure and requires high complexity decision making for assessment and support, frequent evaluation and titration of therapies, application of advanced monitoring technologies and extensive interpretation of multiple databases. Critical Care Time devoted to patient care services described in this note independent of APP/resident time (if applicable)  is 35 minutes.   Lawrence Emery MD [facility] 06/15/19 8:10 AM Personal pager: [phone] If unanswered, please page CCM On-call: [phone]  Addendum: reviewed his other MRN: smoker, being worked up for OSA and potentially COPD. Has GERD, will change H2B to PPI.  Start duonebs.  Also some cardiac hx, will  await echo but Lawrence Medina warrant cards eval.

## 2019-06-15 NOTE — Procedures (Signed)
ELECTROENCEPHALOGRAM REPORT   Patient: Lawrence Medina       Room #: [facility] EEG No. ID: [id number] Age: 53 y.o.        Sex: male Referring Physician: Tamala Julian Report Date:  06/15/2019        Interpreting Physician: Alexis Goodell  History: [name] is an 53 y.o. male s/p arrest  Medications:  Zithromax, Insulin, Zosyn, Precedex, Fentanyl, Levophed  Conditions of Recording:  This is a 21 channel routine scalp EEG performed with bipolar and monopolar montages arranged in accordance to the international 10/20 system of electrode placement. One channel was dedicated to EKG recording.  The patient is in the intubated and sedated state.  Description:  The background activity is consistent with sleep for the majority of the recording.  The background activity is slow and low voltage.  It consists of irrgeular delta activity with superimposed symmetrical sleep spindles and vertex central sharp transients. The patient is stimulated during the recording with activation of the background noted.  The patient achieves a posterior background rhythm of 9-10 Hz alpha briefly.   No epileptiform activity is noted.  Hyperventilation and intermittent photic stimulation were not performed.  IMPRESSION: This electroencephalogram is consistent with normal sleep.  Normal reactivity is noted with the patient achieving an alpha posterior background rhythm briefly.  No epileptiform activity is noted.     Alexis Goodell, MD Neurology [phone] 06/15/2019, 12:23 PM

## 2019-06-15 NOTE — Progress Notes (Signed)
  Echocardiogram 2D Echocardiogram has been performed.  Lawrence Medina [name] Christopher 06/15/2019, 10:03 AM

## 2019-06-15 NOTE — Progress Notes (Signed)
Initial Nutrition Assessment  DOCUMENTATION CODES:  Morbid obesity  INTERVENTION:  Initiate TF via OGT with Vital High Protein at goal rate of 75 ml/h (1800 ml per day)  to provide 1800 kcals, 158 gm protein, 1505 ml free water daily.  Please note pt will be receiving 1.5 L fluid from this regimen  NUTRITION DIAGNOSIS:  Inadequate oral intake related to inability to eat as evidenced by NPO status.  GOAL:  Provide needs based on ASPEN/SCCM guidelines  MONITOR:  Weight trends, Labs, Vent status, Diet advancement, I & O's, Skin, TF tolerance  REASON FOR ASSESSMENT:  Consult Enteral/tube feeding initiation and management  ASSESSMENT:  53 y/o male PMHx HTN, morbid obesity and OSA. Suffered PEA while in car with family. Received epi/cpr and intubated by EMS. In ED, CTA shows bilateral consolidations concerning for extensive CAP. Has bilateral rib fxs from CPR.   RD operating remotely d/t covid precautions. There is no chart history on patient PTA.   Family had reported to MD that pt has had ongoing cough for several weeks and has more recently been told he needed CPAP. Pt has been extremely lethargic past several days, falling sleep in middle of conversations. No mention of pts oral intake during this time.   Patients BMI is borderline for 2 separate methods of estimating kcal/pro. The lower end of the estimated kcal needs was nearly identical. Utilized 22-25 kcal/kg ibw due to a narrowed range and d/t upper kcal range of 11-14 kcal/kg bw feeling excessive.   Patient is currently intubated on ventilator support MV: 13.6 L/min Temp (24hrs), Avg:98.3 F (36.8 C), Min:97.4 F (36.3 C), Max:100.6 F (38.1 C) Propofol: N/A Bp: 112/79  Labs: Phos/Mag/K: WDL. Bun/creat: 15/1.6  WBC:16.1, Albumin: 3.2, recent bgs 93-138 Meds: Insulin, ppi Sedation/analgesia: Precedex, Fentanyl Vasopressor/inotropes: Levophed Other infusions: IV abx, IVF  Recent Labs  Lab 06/14/19 1926  06/14/19 1932  06/14/19 2248 06/15/19 0027 06/15/19 0317 06/15/19 0453  NA 138 137   < > 138 141  --  140  K 2.9* 2.8*   < > 3.8 3.6  --  3.6  CL 106 105  --   --   --   --   --   CO2 18*  --   --   --   --   --   --   BUN 14 15  --   --   --   --   --   CREATININE 1.72* 1.60*  --   --   --   --   --   CALCIUM 7.8*  --   --   --   --   --   --   MG  --   --   --   --   --  2.0  --   PHOS  --   --   --   --   --  3.0  --   GLUCOSE 278* 263*  --   --   --   --   --    < > = values in this interval not displayed.   NUTRITION - FOCUSED PHYSICAL EXAM: Unable to conduct  Diet Order:   Diet Order    None      EDUCATION NEEDS:  No education needs have been identified at this time  Skin:  Skin Assessment: Reviewed RN Assessment  Last BM:  Unknown/PTA  Height:  Ht Readings from Last 1 Encounters:  06/15/19 5\' 11"  (1.803 m)  Weight:  Wt Readings from Last 1 Encounters:  06/15/19 (!) 159.8 kg  -no further wt history available   Ideal Body Weight:  78.18 kg  BMI:  Body mass index is 49.14 kg/m.  Estimated Nutritional Needs:  Kcal:  1720-1950 kcals (22-25 kcal/kg bw) Protein:  156-195g Pro (2-2.5g/kg ibw) Fluid:  Per MD discretion  Burtis Junes RD, LDN, CNSC Clinical Nutrition Available Tues-Sat via Pager: [phone] 06/15/2019 9:48 AM

## 2019-06-15 NOTE — Progress Notes (Signed)
   06/15/19 0900  Clinical Encounter Type  Visited With Health care provider;Patient not available;Family  Visit Type Initial;Psychological support;ED;Other (Comment) (post-cpr)  Referral From Nurse;Other (Comment) (ED Network engineer)  Spiritual Encounters  Spiritual Needs Emotional;Grief support  Stress Factors  Patient Stress Factors Not reviewed  Family Stress Factors Loss of control   Met w/ RN, drs, then w/ family (mom--Mrs. Fawcett, stepdad--MR. Ernst Breach, father is deceased, sister Arbie Cookey, son Zachary George, long-time partner Ms. Apple) in consult rm.  Pt has total of 4 adult kids.  When went to get coffee for family, called away to CPR in ED.  Later connected again w/ family in pt room when code cool called for pt.  Myra Gianotti resident, [phone]

## 2019-06-15 NOTE — Progress Notes (Signed)
Bilateral lower extremity venous duplex completed. Refer to "CV Proc" under chart review to view preliminary results.  Preliminary results discussed with Dr. Tamala Julian.  06/15/2019 12:37 PM Maudry Mayhew, MHA, RVT, RDCS, RDMS

## 2019-06-16 ENCOUNTER — Inpatient Hospital Stay (HOSPITAL_COMMUNITY): Payer: Medicare Other

## 2019-06-16 LAB — CBC
HCT: 37.5 % — ABNORMAL LOW (ref 39.0–52.0)
Hemoglobin: 11.7 g/dL — ABNORMAL LOW (ref 13.0–17.0)
MCH: 28.7 pg (ref 26.0–34.0)
MCHC: 31.2 g/dL (ref 30.0–36.0)
MCV: 92.1 fL (ref 80.0–100.0)
Platelets: 188 10*3/uL (ref 150–400)
RBC: 4.07 MIL/uL — ABNORMAL LOW (ref 4.22–5.81)
RDW: 15 % (ref 11.5–15.5)
WBC: 9.2 10*3/uL (ref 4.0–10.5)
nRBC: 0 % (ref 0.0–0.2)

## 2019-06-16 LAB — BASIC METABOLIC PANEL
Anion gap: 7 (ref 5–15)
BUN: 27 mg/dL — ABNORMAL HIGH (ref 6–20)
CO2: 23 mmol/L (ref 22–32)
Calcium: 8 mg/dL — ABNORMAL LOW (ref 8.9–10.3)
Chloride: 107 mmol/L (ref 98–111)
Creatinine, Ser: 1.67 mg/dL — ABNORMAL HIGH (ref 0.61–1.24)
GFR calc Af Amer: 53 mL/min — ABNORMAL LOW (ref >60)
GFR calc non Af Amer: 46 mL/min — ABNORMAL LOW (ref >60)
Glucose, Bld: 159 mg/dL — ABNORMAL HIGH (ref 70–99)
Potassium: 3.4 mmol/L — ABNORMAL LOW (ref 3.5–5.1)
Sodium: 137 mmol/L (ref 135–145)

## 2019-06-16 LAB — GLUCOSE, CAPILLARY
Glucose-Capillary: 95 mg/dL (ref 70–99)
Glucose-Capillary: 170 mg/dL — ABNORMAL HIGH (ref 70–99)
Glucose-Capillary: 168 mg/dL — ABNORMAL HIGH (ref 70–99)
Glucose-Capillary: 133 mg/dL — ABNORMAL HIGH (ref 70–99)
Glucose-Capillary: 125 mg/dL — ABNORMAL HIGH (ref 70–99)
Glucose-Capillary: 113 mg/dL — ABNORMAL HIGH (ref 70–99)

## 2019-06-16 LAB — TRIGLYCERIDES: Triglycerides: 147 mg/dL (ref <150)

## 2019-06-16 LAB — MAGNESIUM: Magnesium: 2.1 mg/dL (ref 1.7–2.4)

## 2019-06-16 MED ORDER — QUETIAPINE FUMARATE 50 MG PO TABS
50.0000 mg | ORAL_TABLET | Freq: Two times a day (BID) | ORAL | Status: DC
  Administered 2019-06-16 – 2019-06-23 (×16): 50 mg via ORAL
  Filled 2019-06-16 (×17): qty 1

## 2019-06-16 MED ORDER — FUROSEMIDE 10 MG/ML IJ SOLN
40.0000 mg | Freq: Three times a day (TID) | INTRAMUSCULAR | Status: DC
  Administered 2019-06-16 – 2019-06-21 (×16): 40 mg via INTRAVENOUS
  Filled 2019-06-16 (×17): qty 4

## 2019-06-16 MED ORDER — POTASSIUM CHLORIDE 20 MEQ/15ML (10%) PO SOLN
40.0000 meq | ORAL | Status: AC
  Administered 2019-06-16 (×3): 40 meq
  Filled 2019-06-16 (×3): qty 30

## 2019-06-16 MED ORDER — AMOXICILLIN-POT CLAVULANATE 875-125 MG PO TABS
1.0000 | ORAL_TABLET | Freq: Two times a day (BID) | ORAL | Status: DC
  Filled 2019-06-16: qty 1

## 2019-06-16 MED ORDER — PROPOFOL 1000 MG/100ML IV EMUL
5.0000 ug/kg/min | INTRAVENOUS | Status: DC
  Administered 2019-06-16 (×2): 20 ug/kg/min via INTRAVENOUS
  Administered 2019-06-16: 5 ug/kg/min via INTRAVENOUS
  Administered 2019-06-17 (×2): 30 ug/kg/min via INTRAVENOUS
  Administered 2019-06-17: 22:00:00 20 ug/kg/min via INTRAVENOUS
  Administered 2019-06-17 (×3): 30 ug/kg/min via INTRAVENOUS
  Administered 2019-06-18: 20 ug/kg/min via INTRAVENOUS
  Administered 2019-06-18: 30 ug/kg/min via INTRAVENOUS
  Filled 2019-06-16: qty 100
  Filled 2019-06-16: qty 200
  Filled 2019-06-16 (×8): qty 100

## 2019-06-16 MED ORDER — PANTOPRAZOLE SODIUM 40 MG PO PACK
40.0000 mg | PACK | Freq: Every day | ORAL | Status: DC
  Administered 2019-06-16 – 2019-06-24 (×9): 40 mg
  Filled 2019-06-16 (×10): qty 20

## 2019-06-16 MED ORDER — AMOXICILLIN-POT CLAVULANATE 400-57 MG/5ML PO SUSR
875.0000 mg | Freq: Two times a day (BID) | ORAL | Status: AC
  Administered 2019-06-16 – 2019-06-21 (×12): 872 mg via ORAL
  Filled 2019-06-16 (×13): qty 10.9

## 2019-06-16 MED ORDER — POTASSIUM CHLORIDE CRYS ER 20 MEQ PO TBCR: 40.0000 meq | EXTENDED_RELEASE_TABLET | ORAL | Status: DC
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
## 2019-06-16 NOTE — Progress Notes (Signed)
K+ 3.4 this AM. Informed Elink to hopefully replace. Received a call back from RN who said MD did not want to replace K.   Pt stable & resting.

## 2019-06-17 ENCOUNTER — Inpatient Hospital Stay (HOSPITAL_COMMUNITY): Payer: Medicare Other

## 2019-06-17 DIAGNOSIS — R410 Disorientation, unspecified: Secondary | ICD-10-CM

## 2019-06-17 DIAGNOSIS — J9601 Acute respiratory failure with hypoxia: Secondary | ICD-10-CM

## 2019-06-17 DIAGNOSIS — J690 Pneumonitis due to inhalation of food and vomit: Principal | ICD-10-CM

## 2019-06-17 LAB — BASIC METABOLIC PANEL
Anion gap: 11 (ref 5–15)
BUN: 26 mg/dL — ABNORMAL HIGH (ref 6–20)
CO2: 25 mmol/L (ref 22–32)
Calcium: 8.7 mg/dL — ABNORMAL LOW (ref 8.9–10.3)
Chloride: 107 mmol/L (ref 98–111)
Creatinine, Ser: 1.33 mg/dL — ABNORMAL HIGH (ref 0.61–1.24)
GFR calc Af Amer: >60 mL/min (ref >60)
GFR calc non Af Amer: >60 mL/min (ref >60)
Glucose, Bld: 161 mg/dL — ABNORMAL HIGH (ref 70–99)
Potassium: 3.9 mmol/L (ref 3.5–5.1)
Sodium: 143 mmol/L (ref 135–145)

## 2019-06-17 LAB — CULTURE, RESPIRATORY W GRAM STAIN: Culture: NORMAL

## 2019-06-17 LAB — CBC
HCT: 39.4 % (ref 39.0–52.0)
Hemoglobin: 12.5 g/dL — ABNORMAL LOW (ref 13.0–17.0)
MCH: 29.3 pg (ref 26.0–34.0)
MCHC: 31.7 g/dL (ref 30.0–36.0)
MCV: 92.5 fL (ref 80.0–100.0)
Platelets: 191 10*3/uL (ref 150–400)
RBC: 4.26 MIL/uL (ref 4.22–5.81)
RDW: 15.5 % (ref 11.5–15.5)
WBC: 10 10*3/uL (ref 4.0–10.5)
nRBC: 0 % (ref 0.0–0.2)

## 2019-06-17 LAB — GLUCOSE, CAPILLARY
Glucose-Capillary: 105 mg/dL — ABNORMAL HIGH (ref 70–99)
Glucose-Capillary: 149 mg/dL — ABNORMAL HIGH (ref 70–99)
Glucose-Capillary: 130 mg/dL — ABNORMAL HIGH (ref 70–99)
Glucose-Capillary: 103 mg/dL — ABNORMAL HIGH (ref 70–99)
Glucose-Capillary: 106 mg/dL — ABNORMAL HIGH (ref 70–99)
Glucose-Capillary: 95 mg/dL (ref 70–99)

## 2019-06-17 LAB — MAGNESIUM: Magnesium: 1.8 mg/dL (ref 1.7–2.4)

## 2019-06-17 MED ORDER — MIDAZOLAM HCL 2 MG/2ML IJ SOLN
2.0000 mg | INTRAMUSCULAR | Status: DC | PRN
  Administered 2019-06-22: 03:00:00 2 mg via INTRAVENOUS
  Filled 2019-06-17: qty 2

## 2019-06-17 MED ORDER — MIDAZOLAM HCL 2 MG/2ML IJ SOLN
2.0000 mg | Freq: Once | INTRAMUSCULAR | Status: AC
  Administered 2019-06-17: 2 mg via INTRAVENOUS

## 2019-06-17 MED ORDER — VITAL HIGH PROTEIN PO LIQD
1000.0000 mL | ORAL | Status: DC
  Administered 2019-06-18: 1000 mL

## 2019-06-17 MED ORDER — MIDAZOLAM HCL 2 MG/2ML IJ SOLN
INTRAMUSCULAR | Status: AC
  Filled 2019-06-17: qty 2

## 2019-06-17 MED ORDER — MIDAZOLAM HCL 2 MG/2ML IJ SOLN
2.0000 mg | INTRAMUSCULAR | Status: DC | PRN
  Administered 2019-06-18: 2 mg via INTRAVENOUS
  Filled 2019-06-17 (×2): qty 2

## 2019-06-17 MED ORDER — PRO-STAT SUGAR FREE PO LIQD
60.0000 mL | Freq: Four times a day (QID) | ORAL | Status: DC
  Administered 2019-06-17 – 2019-06-19 (×8): 60 mL
  Filled 2019-06-17 (×8): qty 60

## 2019-06-17 NOTE — Progress Notes (Signed)
Nutrition Follow-up  DOCUMENTATION CODES:   Morbid obesity  INTERVENTION:   - Recommend bowel regimen  Tube feeding: - Vital High Protein @ 20 ml/hr (480 ml/day) via OGT - Pro-stat 60 ml QID  Tube feeding regimen provides 1280 kcal, 162 grams of protein, and 401 ml of H2O.   Tube feeding regimen and current propofol provides 2046 total kcal.  NUTRITION DIAGNOSIS:   Inadequate oral intake related to inability to eat as evidenced by NPO status.  Ongoing, being addressed via TF  GOAL:   Provide needs based on ASPEN/SCCM guidelines  Met via TF  MONITOR:   Weight trends, Labs, Vent status, Diet advancement, I & O's, Skin, TF tolerance  REASON FOR ASSESSMENT:   Consult Enteral/tube feeding initiation and management  ASSESSMENT:   53 y/o male PMHx HTN, morbid obesity and OSA. Suffered PEA while in car with family. Received epi/cpr and intubated by EMS. In ED, CTA shows bilateral consolidations concerning for extensive CAP. Has bilateral rib fxs from CPR.  Discussed pt with RN and during ICU rounds. Will adjust TF regimen now that pt is on propofol.  Current TF regimen: Vital High Protein @ 75 ml/hr  NG in place.  Pt with edema to BUE and BLE.  Patient is currently intubated on ventilator support MV: 16.1 L/min Temp (24hrs), Avg:98.1 F (36.7 C), Min:97 F (36.1 C), Max:99.9 F (37.7 C) BP: 114/64 MAP: 70  Propofol: 29 ml/hr (provides 766 kcal daily from lipid) Fentanyl: 15 ml/hr NS: 10 ml/hr  Medications reviewed and include: Lasix, SSI, Lantus 10 units BID, Protonix  Labs reviewed: BUN 26, creatinine 1.33 CBG's: 105-168 x 24 hours  UOP: 3950 ml x 24 hours I/O's: +3.7 L since admit  Diet Order:   Diet Order    None      EDUCATION NEEDS:   No education needs have been identified at this time  Skin:  Skin Assessment: Reviewed RN Assessment  Last BM:  no documented BM  Height:   Ht Readings from Last 1 Encounters:  06/15/19 '5\' 11"'$  (1.803  m)    Weight:   Wt Readings from Last 1 Encounters:  06/17/19 (!) 160.8 kg    Ideal Body Weight:  78.18 kg  BMI:  Body mass index is 49.44 kg/m.  Estimated Nutritional Needs:   Kcal:  1720-1950 kcals (22-25 kcal/kg bw)  Protein:  156-195g Pro (2-2.5g/kg ibw)  Fluid:  Per MD discretion    Gaynell Face, MS, RD, LDN Inpatient Clinical Dietitian Pager: [phone] Weekend/After Hours: [phone]
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
## 2019-06-18 ENCOUNTER — Encounter (HOSPITAL_COMMUNITY): Payer: Self-pay

## 2019-06-18 ENCOUNTER — Inpatient Hospital Stay (HOSPITAL_COMMUNITY): Payer: Medicare Other

## 2019-06-18 LAB — BASIC METABOLIC PANEL
Anion gap: 13 (ref 5–15)
Anion gap: 10 (ref 5–15)
BUN: 31 mg/dL — ABNORMAL HIGH (ref 6–20)
BUN: 35 mg/dL — ABNORMAL HIGH (ref 6–20)
CO2: 27 mmol/L (ref 22–32)
CO2: 31 mmol/L (ref 22–32)
Calcium: 8.6 mg/dL — ABNORMAL LOW (ref 8.9–10.3)
Calcium: 8.8 mg/dL — ABNORMAL LOW (ref 8.9–10.3)
Chloride: 104 mmol/L (ref 98–111)
Chloride: 104 mmol/L (ref 98–111)
Creatinine, Ser: 1.4 mg/dL — ABNORMAL HIGH (ref 0.61–1.24)
Creatinine, Ser: 1.24 mg/dL (ref 0.61–1.24)
GFR calc Af Amer: >60 mL/min (ref >60)
GFR calc Af Amer: >60 mL/min (ref >60)
GFR calc non Af Amer: 57 mL/min — ABNORMAL LOW (ref >60)
GFR calc non Af Amer: >60 mL/min (ref >60)
Glucose, Bld: 123 mg/dL — ABNORMAL HIGH (ref 70–99)
Glucose, Bld: 115 mg/dL — ABNORMAL HIGH (ref 70–99)
Potassium: 3.5 mmol/L (ref 3.5–5.1)
Potassium: 3.4 mmol/L — ABNORMAL LOW (ref 3.5–5.1)
Sodium: 144 mmol/L (ref 135–145)
Sodium: 145 mmol/L (ref 135–145)

## 2019-06-18 LAB — GLUCOSE, CAPILLARY
Glucose-Capillary: 100 mg/dL — ABNORMAL HIGH (ref 70–99)
Glucose-Capillary: 113 mg/dL — ABNORMAL HIGH (ref 70–99)
Glucose-Capillary: 125 mg/dL — ABNORMAL HIGH (ref 70–99)
Glucose-Capillary: 130 mg/dL — ABNORMAL HIGH (ref 70–99)
Glucose-Capillary: 133 mg/dL — ABNORMAL HIGH (ref 70–99)
Glucose-Capillary: 134 mg/dL — ABNORMAL HIGH (ref 70–99)
Glucose-Capillary: 141 mg/dL — ABNORMAL HIGH (ref 70–99)

## 2019-06-18 LAB — CBC
HCT: 37.4 % — ABNORMAL LOW (ref 39.0–52.0)
Hemoglobin: 11.9 g/dL — ABNORMAL LOW (ref 13.0–17.0)
MCH: 29 pg (ref 26.0–34.0)
MCHC: 31.8 g/dL (ref 30.0–36.0)
MCV: 91.2 fL (ref 80.0–100.0)
Platelets: 201 10*3/uL (ref 150–400)
RBC: 4.1 MIL/uL — ABNORMAL LOW (ref 4.22–5.81)
RDW: 15.6 % — ABNORMAL HIGH (ref 11.5–15.5)
WBC: 7.9 10*3/uL (ref 4.0–10.5)
nRBC: 0 % (ref 0.0–0.2)

## 2019-06-18 LAB — MAGNESIUM: Magnesium: 2 mg/dL (ref 1.7–2.4)

## 2019-06-18 LAB — LEGIONELLA PNEUMOPHILA SEROGP 1 UR AG: L. pneumophila Serogp 1 Ur Ag: NEGATIVE

## 2019-06-18 MED ORDER — POTASSIUM CHLORIDE 10 MEQ/50ML IV SOLN
10.0000 meq | INTRAVENOUS | Status: AC
  Administered 2019-06-18 – 2019-06-19 (×4): 10 meq via INTRAVENOUS
  Filled 2019-06-18 (×4): qty 50

## 2019-06-18 MED ORDER — OXYCODONE HCL 5 MG PO TABS
5.0000 mg | ORAL_TABLET | Freq: Four times a day (QID) | ORAL | Status: DC | PRN
  Administered 2019-06-18 – 2019-06-25 (×8): 5 mg via ORAL
  Filled 2019-06-18 (×8): qty 1

## 2019-06-18 MED ORDER — CHLORHEXIDINE GLUCONATE CLOTH 2 % EX PADS
6.0000 | MEDICATED_PAD | Freq: Every day | CUTANEOUS | Status: DC
  Administered 2019-06-19 – 2019-06-24 (×5): 6 via TOPICAL

## 2019-06-18 MED ORDER — FUROSEMIDE 10 MG/ML IJ SOLN
40.0000 mg | Freq: Once | INTRAMUSCULAR | Status: AC
  Administered 2019-06-18: 40 mg via INTRAVENOUS
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
## 2019-06-18 NOTE — Progress Notes (Signed)
[facility] Progress Note Patient Name: Lawrence Medina DOB: 16-Sep-1966 MRN: [id number]   Date of Service  06/18/2019  HPI/Events of Note  Hypokalemia - K+ = 3.4 and Creatinine = 1.24.  eICU Interventions  Will replace K+.      Intervention Category Major Interventions: Electrolyte abnormality - evaluation and management  [name] 06/18/2019, 9:05 PM

## 2019-06-18 NOTE — Progress Notes (Signed)
[facility] Progress Note Patient Name: Lawrence Medina DOB: Jun 19, 1966 MRN: [id number]   Date of Service  06/18/2019  HPI/Events of Note  Hypoxia - Pink frothy sputum reported. Given Lasix 40 mg IV by Dr. Loanne Drilling. Ppeak = 46 concerning. Looks well sedated. ETT = 7.5. BP = 100/73. Sat = 95%  eICU Interventions  Will order: 1. Portable CXR STAT.      Intervention Category Major Interventions: Hypoxemia - evaluation and management  Sommer,Steven Eugene 06/18/2019, 7:45 PM

## 2019-06-19 LAB — CULTURE, BLOOD (ROUTINE X 2)
Culture: NO GROWTH
Culture: NO GROWTH

## 2019-06-19 LAB — GLUCOSE, CAPILLARY
Glucose-Capillary: 97 mg/dL (ref 70–99)
Glucose-Capillary: 113 mg/dL — ABNORMAL HIGH (ref 70–99)
Glucose-Capillary: 122 mg/dL — ABNORMAL HIGH (ref 70–99)
Glucose-Capillary: 107 mg/dL — ABNORMAL HIGH (ref 70–99)
Glucose-Capillary: 97 mg/dL (ref 70–99)

## 2019-06-19 LAB — BASIC METABOLIC PANEL
Anion gap: 11 (ref 5–15)
Anion gap: 11 (ref 5–15)
BUN: 39 mg/dL — ABNORMAL HIGH (ref 6–20)
BUN: 37 mg/dL — ABNORMAL HIGH (ref 6–20)
CO2: 31 mmol/L (ref 22–32)
CO2: 31 mmol/L (ref 22–32)
Calcium: 8.7 mg/dL — ABNORMAL LOW (ref 8.9–10.3)
Calcium: 9.1 mg/dL (ref 8.9–10.3)
Chloride: 104 mmol/L (ref 98–111)
Chloride: 105 mmol/L (ref 98–111)
Creatinine, Ser: 1.37 mg/dL — ABNORMAL HIGH (ref 0.61–1.24)
Creatinine, Ser: 1.26 mg/dL — ABNORMAL HIGH (ref 0.61–1.24)
GFR calc Af Amer: >60 mL/min (ref >60)
GFR calc Af Amer: >60 mL/min (ref >60)
GFR calc non Af Amer: 58 mL/min — ABNORMAL LOW (ref >60)
GFR calc non Af Amer: >60 mL/min (ref >60)
Glucose, Bld: 101 mg/dL — ABNORMAL HIGH (ref 70–99)
Glucose, Bld: 118 mg/dL — ABNORMAL HIGH (ref 70–99)
Potassium: 3.3 mmol/L — ABNORMAL LOW (ref 3.5–5.1)
Potassium: 3.3 mmol/L — ABNORMAL LOW (ref 3.5–5.1)
Sodium: 146 mmol/L — ABNORMAL HIGH (ref 135–145)
Sodium: 147 mmol/L — ABNORMAL HIGH (ref 135–145)

## 2019-06-19 LAB — CBC
HCT: 36.8 % — ABNORMAL LOW (ref 39.0–52.0)
Hemoglobin: 11.6 g/dL — ABNORMAL LOW (ref 13.0–17.0)
MCH: 29 pg (ref 26.0–34.0)
MCHC: 31.5 g/dL (ref 30.0–36.0)
MCV: 92 fL (ref 80.0–100.0)
Platelets: 212 10*3/uL (ref 150–400)
RBC: 4 MIL/uL — ABNORMAL LOW (ref 4.22–5.81)
RDW: 15.6 % — ABNORMAL HIGH (ref 11.5–15.5)
WBC: 7.5 10*3/uL (ref 4.0–10.5)
nRBC: 0 % (ref 0.0–0.2)

## 2019-06-19 LAB — MAGNESIUM: Magnesium: 2.5 mg/dL — ABNORMAL HIGH (ref 1.7–2.4)

## 2019-06-19 MED ORDER — SODIUM CHLORIDE 0.9% FLUSH
10.0000 mL | Freq: Two times a day (BID) | INTRAVENOUS | Status: DC
  Administered 2019-06-19 – 2019-06-23 (×9): 10 mL

## 2019-06-19 MED ORDER — PRO-STAT SUGAR FREE PO LIQD
60.0000 mL | Freq: Three times a day (TID) | ORAL | Status: DC
  Administered 2019-06-19 – 2019-06-23 (×12): 60 mL
  Filled 2019-06-19 (×12): qty 60

## 2019-06-19 MED ORDER — VITAL 1.5 CAL PO LIQD
1000.0000 mL | ORAL | Status: DC
  Administered 2019-06-19 – 2019-06-22 (×3): 1000 mL
  Filled 2019-06-19 (×5): qty 1000

## 2019-06-19 MED ORDER — SODIUM CHLORIDE 0.9% FLUSH: 10.0000 mL | INTRAVENOUS | Status: DC | PRN

## 2019-06-19 MED ORDER — POTASSIUM CHLORIDE 20 MEQ/15ML (10%) PO SOLN
20.0000 meq | ORAL | Status: AC
  Administered 2019-06-19 (×2): 20 meq
  Filled 2019-06-19 (×2): qty 15

## 2019-06-19 NOTE — Progress Notes (Signed)
[facility] ADULT ICU REPLACEMENT PROTOCOL FOR AM LAB REPLACEMENT ONLY  The patient does apply for the [facility] Adult ICU Electrolyte Replacment Protocol based on the criteria listed below:   1. Is GFR >/= 40 ml/min? Yes.    Patient's GFR today is >60 2. Is urine output >/= 0.5 ml/kg/hr for the last 6 hours? Yes.   Patient's UOP is 0.91 ml/kg/hr 3. Is BUN < 60 mg/dL? Yes.    Patient's BUN today is 39 4. Abnormal electrolyte K 3.3 5. Ordered repletion with: per protocol 6. If a panic level lab has been reported, has the CCM MD in charge been notified? Yes.  .   Physician:  Illene Labrador, [place]ary Brim 06/19/2019 5:48 AM

## 2019-06-19 NOTE — Progress Notes (Signed)
Nutrition Follow-up  DOCUMENTATION CODES:   Morbid obesity  INTERVENTION:   Tube feeding: - Vital 1.5 @ 40 ml/hr (960 ml/day) via OG tube - Pro-stat 60 ml TID  Tube feeding regimen provides 2040 kcal, 155 grams of protein, and 733 ml of H2O.  - d/c Vital High Protein  NUTRITION DIAGNOSIS:   Inadequate oral intake related to inability to eat as evidenced by NPO status.  Ongoing, being addressed via TF  GOAL:   Provide needs based on ASPEN/SCCM guidelines  Met via TF  MONITOR:   Weight trends, Labs, Vent status, Diet advancement, I & O's, Skin, TF tolerance  REASON FOR ASSESSMENT:   Consult Enteral/tube feeding initiation and management  ASSESSMENT:   53 y/o male PMHx HTN, morbid obesity and OSA. Suffered PEA while in car with family. Received epi/cpr and intubated by EMS. In ED, CTA shows bilateral consolidations concerning for extensive CAP. Has bilateral rib fxs from CPR.  Discussed pt with RN and during ICU rounds. Will adjust TF regimen now that pt is off propofol.  Weight is down 14 lbs since first measured weight on 7/04. Pt continues to diurese. Pt continues to have edema to BUE and BLE.  Current TF regimen: Vital High Protein @ 20, Pro-stat 60 ml QID  NG tube in place.  Patient is currently intubated on ventilator support MV: 13.2 L/min Temp (24hrs), Avg:99.8 F (37.7 C), Min:99 F (37.2 C), Max:100.4 F (38 C) BP: 116/77 MAP: 90  Fentanyl: 10 ml/hr  Medications reviewed and include: Lasix 40 mg q 8 hours, SSI, Lantus 10 units BID, Protonix  Labs reviewed: sodium 147, potassium 3.3 CBG's: 97-134 x 24 hours  UOP: 3665 ml x 24 hours  Diet Order:   Diet Order    None      EDUCATION NEEDS:   No education needs have been identified at this time  Skin:  Skin Assessment: Reviewed RN Assessment  Last BM:  06/19/19 large type 7  Height:   Ht Readings from Last 1 Encounters:  06/15/19 _0  (1.803 m)    Weight:   Wt Readings from  Last 1 Encounters:  06/19/19 (!) 153.5 kg    Ideal Body Weight:  78.18 kg  BMI:  Body mass index is 47.2 kg/m.  Estimated Nutritional Needs:   Kcal:  7001-7494  Protein:  156-195g Pro (2-2.5g/kg ibw)  Fluid:  Per MD discretion    Gaynell Face, MS, RD, LDN Inpatient Clinical Dietitian Pager: [phone] Weekend/After Hours: [phone]

## 2019-06-19 NOTE — Progress Notes (Signed)
NAME:  Lawrence Medina, MRN:  [id number], DOB:  [birth year], LOS: 5 ADMISSION DATE:  06/14/2019, CONSULTATION DATE:  06/14/2019 REFERRING MD:  EDP, CHIEF COMPLAINT:  Post arrest    Brief History   53yo male with hx HTN and untreated OSA presented 7/3 to ER after witnessed PEA cardiac arrest. CTA chest revealed bilateral consolidations concerning for extensive CAP, no PE. Had been treated with antibiotics as outpatient. COVID negative.  Remains on vent with high PEEP fio2 requirements. Neurologically intact.   Past Medical History  HTN  GERD  Obesity Untreated OSA  Significant Hospital Events   7/3 admitted  Consults:  NA  Procedures:  R IJ CVL (ED) 7/3>>> ETT 7/3>>>  Significant Diagnostic Tests:  CT head 7/3>>>neg acute  CTA chest 7/3>>>multifocal PNA  2D echo 7/3>>>diastolic dysfunction, RV okay EEG fine LE duplex- no PE  Micro Data:  Sputum NGTD MRSA swab neg Blood cultures NGT  Antimicrobials:   Zosyn, azithromycin 7/3>>7/4 Augmentin 7/5>  Interim history/subjective:  Episode of desats yesterday evening requiring bag lavage. Still has thick secretions from ET tube.  Objective   Blood pressure 127/76, pulse (!) 103, temperature 99.5 F (37.5 C), resp. rate (!) 24, height 5\' 11"  (1.803 m), weight (!) 153.5 kg, SpO2 93 %.    Vent Mode: PRVC FiO2 (%):  [70 %-80 %] 80 % Set Rate:  [24 bmp] 24 bmp Vt Set:  [600 mL-620 mL] 600 mL PEEP:  [12 cmH20] 12 cmH20 Plateau Pressure:  [27 cmH20-32 cmH20] 30 cmH20   Intake/Output Summary (Last 24 hours) at 06/19/2019 0849 Last data filed at 06/19/2019 0800 Gross per 24 hour  Intake 1336.73 ml  Output 3615 ml  Net -2278.27 ml   Filed Weights   06/17/19 0500 06/18/19 0500 06/19/19 0500  Weight: (!) 160.8 kg (!) 157.8 kg (!) 153.5 kg   Physical Exam: Gen:      No acute distress, obese HEENT:  EOMI, sclera anicteric, ETT Neck:     No masses; no thyromegaly Lungs:    Clear to auscultation bilaterally; normal respiratory effort  CV:         Regular rate and rhythm; no murmurs Abd:      + bowel sounds; soft, non-tender; no palpable masses, no distension Ext:    1+ edema; adequate peripheral perfusion Skin:      Warm and dry; no rash Neuro: Awake, moves all extremities. No focal deficits  Resolved Hospital Problem list   Septic shock  Assessment & Plan:  53 year old with PEA arrest, respiratory failure likely secondary to pneumonia from aspiration  PEA arrest Neurologically intact.  No evidence of PE or cardiac abnormality Continue supportive care, telemetry monitoring  Respiratory failure, aspiration pneumonia Undiagnosed OSA Continue vent support Reduce PEEP/FiO2 as tolerated Lasix for diuresis Continue duo nebs Continue Augmentin Will need CPAP when extubated  Bilateral rib fractures Pain control Oxycodone as needed  Acute delirium: resolved Continue seroquel 50 mg BID Wean propofol  Left leg complex baker's cyst vs. Malignancy- warrants OP workup   Best practice:  Diet: TF Pain/Anxiety/Delirium protocol (if indicated): see discussion above VAP protocol (if indicated): Ordered DVT prophylaxis: Subcu heparin GI prophylaxis: PTA PPI Glucose control: Sliding scale insulin + lantus Mobility: bedrest Code Status: Full Family Communication: Updated mom 7/8 over telephone Disposition: ICU  The patient is critically ill with multiple organ system failure and requires high complexity decision making for assessment and support, frequent evaluation and titration of therapies, advanced monitoring, review of radiographic studies and  interpretation of complex data.   Critical Care Time devoted to patient care services, exclusive of separately billable procedures, described in this note is 35 minutes.   Marshell Garfinkel MD [facility] Pager [phone] If no answer call [phone] 06/19/2019, 8:50 AM

## 2019-06-20 ENCOUNTER — Inpatient Hospital Stay (HOSPITAL_COMMUNITY): Payer: Medicare Other

## 2019-06-20 DIAGNOSIS — J9601 Acute respiratory failure with hypoxia: Secondary | ICD-10-CM

## 2019-06-20 LAB — POCT I-STAT 7, (LYTES, BLD GAS, ICA,H+H)
Acid-Base Excess: 15 mmol/L — ABNORMAL HIGH (ref 0.0–2.0)
Bicarbonate: 40.5 mmol/L — ABNORMAL HIGH (ref 20.0–28.0)
Calcium, Ion: 1.26 mmol/L (ref 1.15–1.40)
HCT: 36 % — ABNORMAL LOW (ref 39.0–52.0)
Hemoglobin: 12.2 g/dL — ABNORMAL LOW (ref 13.0–17.0)
O2 Saturation: 92 %
Patient temperature: 98.6
Potassium: 3.3 mmol/L — ABNORMAL LOW (ref 3.5–5.1)
Sodium: 149 mmol/L — ABNORMAL HIGH (ref 135–145)
TCO2: 42 mmol/L — ABNORMAL HIGH (ref 22–32)
pCO2 arterial: 55.1 mmHg — ABNORMAL HIGH (ref 32.0–48.0)
pH, Arterial: 7.474 — ABNORMAL HIGH (ref 7.350–7.450)
pO2, Arterial: 62 mmHg — ABNORMAL LOW (ref 83.0–108.0)

## 2019-06-20 LAB — CBC
HCT: 38.4 % — ABNORMAL LOW (ref 39.0–52.0)
Hemoglobin: 11.7 g/dL — ABNORMAL LOW (ref 13.0–17.0)
MCH: 28.5 pg (ref 26.0–34.0)
MCHC: 30.5 g/dL (ref 30.0–36.0)
MCV: 93.4 fL (ref 80.0–100.0)
Platelets: 236 10*3/uL (ref 150–400)
RBC: 4.11 MIL/uL — ABNORMAL LOW (ref 4.22–5.81)
RDW: 15.2 % (ref 11.5–15.5)
WBC: 7.6 10*3/uL (ref 4.0–10.5)
nRBC: 0 % (ref 0.0–0.2)

## 2019-06-20 LAB — BASIC METABOLIC PANEL
Anion gap: 11 (ref 5–15)
BUN: 39 mg/dL — ABNORMAL HIGH (ref 6–20)
CO2: 33 mmol/L — ABNORMAL HIGH (ref 22–32)
Calcium: 9.3 mg/dL (ref 8.9–10.3)
Chloride: 106 mmol/L (ref 98–111)
Creatinine, Ser: 1.1 mg/dL (ref 0.61–1.24)
GFR calc Af Amer: >60 mL/min (ref >60)
GFR calc non Af Amer: >60 mL/min (ref >60)
Glucose, Bld: 104 mg/dL — ABNORMAL HIGH (ref 70–99)
Potassium: 3.3 mmol/L — ABNORMAL LOW (ref 3.5–5.1)
Sodium: 150 mmol/L — ABNORMAL HIGH (ref 135–145)

## 2019-06-20 LAB — GLUCOSE, CAPILLARY
Glucose-Capillary: 100 mg/dL — ABNORMAL HIGH (ref 70–99)
Glucose-Capillary: 108 mg/dL — ABNORMAL HIGH (ref 70–99)
Glucose-Capillary: 111 mg/dL — ABNORMAL HIGH (ref 70–99)
Glucose-Capillary: 119 mg/dL — ABNORMAL HIGH (ref 70–99)
Glucose-Capillary: 124 mg/dL — ABNORMAL HIGH (ref 70–99)
Glucose-Capillary: 133 mg/dL — ABNORMAL HIGH (ref 70–99)
Glucose-Capillary: 85 mg/dL (ref 70–99)

## 2019-06-20 LAB — PHOSPHORUS: Phosphorus: 3.3 mg/dL (ref 2.5–4.6)

## 2019-06-20 LAB — MAGNESIUM: Magnesium: 2.6 mg/dL — ABNORMAL HIGH (ref 1.7–2.4)

## 2019-06-20 MED ORDER — FREE WATER
300.0000 mL | Freq: Three times a day (TID) | Status: DC
  Administered 2019-06-20 – 2019-06-23 (×9): 300 mL

## 2019-06-20 MED ORDER — POTASSIUM CHLORIDE 20 MEQ/15ML (10%) PO SOLN
20.0000 meq | ORAL | Status: AC
  Administered 2019-06-20 (×2): 20 meq
  Filled 2019-06-20 (×2): qty 15

## 2019-06-20 NOTE — Progress Notes (Signed)
Assisted tele visit to patient with son.  Sybella Harnish Samson, RN  

## 2019-06-20 NOTE — Progress Notes (Signed)
NAME:  Lawrence Medina, MRN:  [id number], DOB:  11-Sep-1966, LOS: 6 ADMISSION DATE:  06/14/2019, CONSULTATION DATE:  06/14/2019 REFERRING MD:  EDP, CHIEF COMPLAINT:  Post arrest    Brief History   53yo male with hx HTN and untreated OSA presented 7/3 to ER after witnessed PEA cardiac arrest. CTA chest revealed bilateral consolidations concerning for extensive CAP, no PE. Had been treated with antibiotics as outpatient. COVID negative.  Remains on vent with high PEEP fio2 requirements. Neurologically intact.   Past Medical History  HTN  GERD  Obesity Untreated OSA  Significant Hospital Events   7/3 admitted  Consults:  NA  Procedures:  R IJ CVL (ED) 7/3>>> ETT 7/3>>>  Significant Diagnostic Tests:  CT head 7/3>>>neg acute  CTA chest 7/3>>>multifocal PNA  2D echo 7/3>>>diastolic dysfunction, RV okay EEG fine LE duplex- no PE  Micro Data:  Sputum NGTD MRSA swab neg Blood cultures NGT  Antimicrobials:  Zosyn, azithromycin 7/3>>7/4 Augmentin 7/5>  Interim history/subjective:  Continues on the vent.  Still requiring 12 of PEEP and 80% FiO2 Awake, writing notes and watching TV.  Complains of some chest discomfort  Objective   Blood pressure 116/67, pulse 91, temperature 98.4 F (36.9 C), resp. rate (!) 24, height 5\' 11"  (1.803 m), weight (!) 151.3 kg, SpO2 96 %.    Vent Mode: PRVC FiO2 (%):  [80 %] 80 % Set Rate:  [24 bmp] 24 bmp Vt Set:  [600 mL] 600 mL PEEP:  [12 cmH20] 12 cmH20 Plateau Pressure:  [26 cmH20-39 cmH20] 27 cmH20   Intake/Output Summary (Last 24 hours) at 06/20/2019 0840 Last data filed at 06/20/2019 0700 Gross per 24 hour  Intake 1323.5 ml  Output 4107 ml  Net -2783.5 ml   Filed Weights   06/18/19 0500 06/19/19 0500 06/20/19 0600  Weight: (!) 157.8 kg (!) 153.5 kg (!) 151.3 kg   Physical Exam: Gen:      No acute distress, obese HEENT:  EOMI, sclera anicteric, ETT Neck:     No masses; no thyromegaly Lungs:    Clear to auscultation bilaterally; normal  respiratory effort CV:         Regular rate and rhythm; no murmurs Abd:      + bowel sounds; soft, non-tender; no palpable masses, no distension Ext:    No edema; adequate peripheral perfusion Skin:      Warm and dry; no rash Neuro: alert and oriented x 3  Chest x-ray reviewed personally 7/9-improving bilateral infiltrates.  Resolved Hospital Problem list   Septic shock  Assessment & Plan:  53 year old with PEA arrest, respiratory failure likely secondary to pneumonia from aspiration  PEA arrest Neurologically intact.  No evidence of PE or cardiac abnormality Continue supportive care, telemetry monitoring  Respiratory failure, aspiration pneumonia Undiagnosed OSA Continue vent support Reduce PEEP/FiO2 as tolerated Lasix for diuresis Continue duo nebs Continue Augmentin Will need CPAP when extubated  Bilateral rib fractures Pain control Oxycodone as needed  Acute delirium: resolved Continue seroquel 50 mg BID Wean propofol  Hypernatremia Start free water  Left leg complex baker's cyst vs. Malignancy- warrants OP workup   Best practice:  Diet: TF Pain/Anxiety/Delirium protocol (if indicated): see discussion above VAP protocol (if indicated): Ordered DVT prophylaxis: Subcu heparin GI prophylaxis: PTA PPI Glucose control: Sliding scale insulin + lantus Mobility: bedrest Code Status: Full Family Communication: Updated mom 7/9 and daily  over telephone Disposition: ICU  The patient is critically ill with multiple organ system failure and requires high  complexity decision making for assessment and support, frequent evaluation and titration of therapies, advanced monitoring, review of radiographic studies and interpretation of complex data.   Critical Care Time devoted to patient care services, exclusive of separately billable procedures, described in this note is 35 minutes.   Marshell Garfinkel MD [place] [facility] Pager [phone] If no answer  call [phone] 06/20/2019, 8:40 AM

## 2019-06-20 NOTE — Progress Notes (Signed)
[facility] ADULT ICU REPLACEMENT PROTOCOL FOR AM LAB REPLACEMENT ONLY  The patient does apply for the [facility] Adult ICU Electrolyte Replacment Protocol based on the criteria listed below:   1. Is GFR >/= 40 ml/min? Yes.    Patient's GFR today is >60 2. Is urine output >/= 0.5 ml/kg/hr for the last 6 hours? Yes.   Patient's UOP is 1.2 ml/kg/hr 3. Is BUN < 60 mg/dL? Yes.    Patient's BUN today is 39 4. Abnormal electrolyte(s): K-3.3 5. Ordered repletion with: per protocol 6. If a panic level lab has been reported, has the CCM MD in charge been notified? Yes.  .   Physician:  Dr. Harlene Ramus, [place]is [place] 06/20/2019 5:56 AM

## 2019-06-21 ENCOUNTER — Inpatient Hospital Stay (HOSPITAL_COMMUNITY): Payer: Medicare Other

## 2019-06-21 LAB — CBC
HCT: 39.7 % (ref 39.0–52.0)
Hemoglobin: 12.1 g/dL — ABNORMAL LOW (ref 13.0–17.0)
MCH: 28.7 pg (ref 26.0–34.0)
MCHC: 30.5 g/dL (ref 30.0–36.0)
MCV: 94.1 fL (ref 80.0–100.0)
Platelets: 258 10*3/uL (ref 150–400)
RBC: 4.22 MIL/uL (ref 4.22–5.81)
RDW: 14.9 % (ref 11.5–15.5)
WBC: 7.8 10*3/uL (ref 4.0–10.5)
nRBC: 0 % (ref 0.0–0.2)

## 2019-06-21 LAB — GLUCOSE, CAPILLARY
Glucose-Capillary: 100 mg/dL — ABNORMAL HIGH (ref 70–99)
Glucose-Capillary: 101 mg/dL — ABNORMAL HIGH (ref 70–99)
Glucose-Capillary: 116 mg/dL — ABNORMAL HIGH (ref 70–99)
Glucose-Capillary: 117 mg/dL — ABNORMAL HIGH (ref 70–99)
Glucose-Capillary: 122 mg/dL — ABNORMAL HIGH (ref 70–99)
Glucose-Capillary: 125 mg/dL — ABNORMAL HIGH (ref 70–99)
Glucose-Capillary: 129 mg/dL — ABNORMAL HIGH (ref 70–99)

## 2019-06-21 LAB — BASIC METABOLIC PANEL
Anion gap: 12 (ref 5–15)
BUN: 42 mg/dL — ABNORMAL HIGH (ref 6–20)
CO2: 32 mmol/L (ref 22–32)
Calcium: 9.3 mg/dL (ref 8.9–10.3)
Chloride: 107 mmol/L (ref 98–111)
Creatinine, Ser: 0.91 mg/dL (ref 0.61–1.24)
GFR calc Af Amer: >60 mL/min (ref >60)
GFR calc non Af Amer: >60 mL/min (ref >60)
Glucose, Bld: 104 mg/dL — ABNORMAL HIGH (ref 70–99)
Potassium: 3.4 mmol/L — ABNORMAL LOW (ref 3.5–5.1)
Sodium: 151 mmol/L — ABNORMAL HIGH (ref 135–145)

## 2019-06-21 LAB — PHOSPHORUS: Phosphorus: 4.8 mg/dL — ABNORMAL HIGH (ref 2.5–4.6)

## 2019-06-21 LAB — MAGNESIUM: Magnesium: 2.6 mg/dL — ABNORMAL HIGH (ref 1.7–2.4)

## 2019-06-21 MED ORDER — POTASSIUM CHLORIDE 20 MEQ/15ML (10%) PO SOLN
40.0000 meq | Freq: Once | ORAL | Status: AC
  Administered 2019-06-21: 06:00:00 40 meq
  Filled 2019-06-21: qty 30

## 2019-06-21 MED ORDER — DEXTROSE 5 % IV SOLN
INTRAVENOUS | Status: DC
  Administered 2019-06-21 – 2019-06-24 (×7): via INTRAVENOUS

## 2019-06-21 MED ORDER — FUROSEMIDE 10 MG/ML IJ SOLN
40.0000 mg | Freq: Two times a day (BID) | INTRAMUSCULAR | Status: DC
  Administered 2019-06-21 – 2019-06-22 (×2): 40 mg via INTRAVENOUS
  Filled 2019-06-21 (×2): qty 4

## 2019-06-21 NOTE — Progress Notes (Addendum)
NAME:  Lawrence Medina, MRN:  [id number], DOB:  1966-01-04, LOS: 7 ADMISSION DATE:  06/14/2019, CONSULTATION DATE:  06/14/2019 REFERRING MD:  EDP, CHIEF COMPLAINT:  Post arrest    Brief History   53yo male with hx HTN and untreated OSA presented 7/3 to ER after witnessed PEA cardiac arrest. CTA chest revealed bilateral consolidations concerning for extensive CAP, no PE. Had been treated with antibiotics as outpatient. COVID negative.  Remains on vent with high PEEP fio2 requirements. Neurologically intact.   Past Medical History  HTN  GERD  Obesity Untreated OSA  Significant Hospital Events   7/3 admitted 7/10 Weaning PEEP and Fi02. Still at 70%/10. Tells nurse that he had been using cocaine PTA  Consults:  NA  Procedures:  R IJ CVL (ED) 7/3>>> ETT 7/3>>>  Significant Diagnostic Tests:  CT head 7/3>>>neg acute  CTA chest 7/3>>>multifocal PNA  2D echo 7/3>>>diastolic dysfunction, RV okay EEG fine LE duplex- no PE  Micro Data:  Sputum NGTD MRSA swab neg Blood cultures NGT  Antimicrobials:  Zosyn, azithromycin 7/3>>7/4 Augmentin 7/5>  Interim history/subjective:  Remains on the vent.  FiO2 weaned to 70% No acute events.  Objective   Blood pressure (!) 148/71, pulse 94, temperature 98.8 F (37.1 C), resp. rate (!) 24, height 5\' 11"  (1.803 m), weight (!) 150.8 kg, SpO2 97 %.    Vent Mode: PRVC FiO2 (%):  [70 %-80 %] 70 % Set Rate:  [24 bmp] 24 bmp Vt Set:  [600 mL] 600 mL PEEP:  [12 cmH20] 12 cmH20 Plateau Pressure:  [24 cmH20-31 cmH20] 25 cmH20   Intake/Output Summary (Last 24 hours) at 06/21/2019 0830 Last data filed at 06/21/2019 0700 Gross per 24 hour  Intake 1479.21 ml  Output 3535 ml  Net -2055.79 ml   Filed Weights   06/19/19 0500 06/20/19 0600 06/21/19 0407  Weight: (!) 153.5 kg (!) 151.3 kg (!) 150.8 kg   Physical Exam: Gen:      No acute distress HEENT:  EOMI, sclera anicteric Neck:     No masses; no thyromegaly, ETT Lungs:    Clear to auscultation  bilaterally; normal respiratory effort CV:         Regular rate and rhythm; no murmurs Abd:      + bowel sounds; soft, non-tender; no palpable masses, no distension Ext:    No edema; adequate peripheral perfusion Skin:      Warm and dry; no rash Neuro: Arousable, no focal deficits  Chest x-ray reviewed personally 7/10- improving lung infiltrates  Resolved Hospital Problem list   Septic shock  Assessment & Plan:  53 year old with PEA arrest, respiratory failure likely secondary to pneumonia from aspiration and cocaine use  PEA arrest Neurologically intact.  No evidence of PE or cardiac abnormality Continue supportive care, telemetry monitoring.  Respiratory failure, aspiration pneumonia Pneumonitis from cocaine.  Undiagnosed OSA Continue vent support Reduce PEEP/FiO2 as tolerated Lasix for diuresis Continue duo nebs Continue Augmentin Will need CPAP when extubated  Bilateral rib fractures Pain control Oxycodone as needed  Acute delirium: resolved Continue seroquel 50 mg BID Wean propofol  Hypernatremia Reduce Lasix dose Continue free water Start D5W @ 50c/hr  Left leg complex baker's cyst vs. Malignancy- warrants OP workup   Best practice:  Diet: TF Pain/Anxiety/Delirium protocol (if indicated): see discussion above VAP protocol (if indicated): Ordered DVT prophylaxis: Subcu heparin GI prophylaxis: PTA PPI Glucose control: Sliding scale insulin + lantus Mobility: bedrest Code Status: Full Family Communication: Updated mom daily. On 7/10  got voice mail  over telephone Disposition: ICU  The patient is critically ill with multiple organ system failure and requires high complexity decision making for assessment and support, frequent evaluation and titration of therapies, advanced monitoring, review of radiographic studies and interpretation of complex data.   Critical Care Time devoted to patient care services, exclusive of separately billable procedures,  described in this note is 35 minutes.   Marshell Garfinkel MD [place] [facility] Pager [phone] If no answer call [phone] 06/21/2019, 8:30 AM

## 2019-06-22 DIAGNOSIS — G4733 Obstructive sleep apnea (adult) (pediatric): Secondary | ICD-10-CM

## 2019-06-22 LAB — CULTURE, RESPIRATORY W GRAM STAIN: Culture: NORMAL

## 2019-06-22 LAB — CBC
HCT: 42.1 % (ref 39.0–52.0)
Hemoglobin: 13 g/dL (ref 13.0–17.0)
MCH: 29.1 pg (ref 26.0–34.0)
MCHC: 30.9 g/dL (ref 30.0–36.0)
MCV: 94.2 fL (ref 80.0–100.0)
Platelets: 274 10*3/uL (ref 150–400)
RBC: 4.47 MIL/uL (ref 4.22–5.81)
RDW: 14.7 % (ref 11.5–15.5)
WBC: 8.1 10*3/uL (ref 4.0–10.5)
nRBC: 0 % (ref 0.0–0.2)

## 2019-06-22 LAB — BASIC METABOLIC PANEL
Anion gap: 11 (ref 5–15)
BUN: 46 mg/dL — ABNORMAL HIGH (ref 6–20)
CO2: 32 mmol/L (ref 22–32)
Calcium: 9.5 mg/dL (ref 8.9–10.3)
Chloride: 106 mmol/L (ref 98–111)
Creatinine, Ser: 1.13 mg/dL (ref 0.61–1.24)
GFR calc Af Amer: >60 mL/min (ref >60)
GFR calc non Af Amer: >60 mL/min (ref >60)
Glucose, Bld: 104 mg/dL — ABNORMAL HIGH (ref 70–99)
Potassium: 3.6 mmol/L (ref 3.5–5.1)
Sodium: 149 mmol/L — ABNORMAL HIGH (ref 135–145)

## 2019-06-22 LAB — GLUCOSE, CAPILLARY
Glucose-Capillary: 99 mg/dL (ref 70–99)
Glucose-Capillary: 111 mg/dL — ABNORMAL HIGH (ref 70–99)
Glucose-Capillary: 124 mg/dL — ABNORMAL HIGH (ref 70–99)

## 2019-06-22 LAB — MAGNESIUM: Magnesium: 2.8 mg/dL — ABNORMAL HIGH (ref 1.7–2.4)

## 2019-06-22 LAB — PHOSPHORUS: Phosphorus: 4.9 mg/dL — ABNORMAL HIGH (ref 2.5–4.6)

## 2019-06-22 MED ORDER — FUROSEMIDE 10 MG/ML IJ SOLN
40.0000 mg | Freq: Every day | INTRAMUSCULAR | Status: DC
  Administered 2019-06-23 – 2019-06-25 (×3): 40 mg via INTRAVENOUS
  Filled 2019-06-22 (×3): qty 4

## 2019-06-22 NOTE — Procedures (Signed)
Patient Name: Lawrence Medina, Lawrence Medina Study Date: 06/08/2019 Gender: Male D.O.B: November 09, 1966 Age (years): 1 Referring Provider: Baird Lyons MD, ABSM Height (inches): 72 Interpreting Physician: Baird Lyons MD, ABSM Weight (lbs): 350 RPSGT: Lanae Boast BMI: 47 MRN: [id number] Neck Size: 22.00  CLINICAL INFORMATION Sleep Study Type: NPSG Indication for sleep study: Daytime Fatigue, Morbid Obesity, Snoring Epworth Sleepiness Score: 21  SLEEP STUDY TECHNIQUE As per the AASM Manual for the Scoring of Sleep and Associated Events v2.3 (April 2016) with a hypopnea requiring 4% desaturations.  The channels recorded and monitored were frontal, central and occipital EEG, electrooculogram (EOG), submentalis EMG (chin), nasal and oral airflow, thoracic and abdominal wall motion, anterior tibialis EMG, snore microphone, electrocardiogram, and pulse oximetry.  MEDICATIONS Medications self-administered by patient taken the night of the study : none reported  SLEEP ARCHITECTURE The study was initiated at 9:56:31 PM and ended at 4:00:34 AM.  Sleep onset time was 4.3 minutes and the sleep efficiency was 54.7%. The total sleep time was 199 minutes.  Stage REM latency was 222.0 minutes.  The patient spent 45.48% of the night in stage N1 sleep, 48.74% in stage N2 sleep, 0.00% in stage N3 and 5.8% in REM.  Alpha intrusion was absent.  Supine sleep was 33.72%.  RESPIRATORY PARAMETERS The overall apnea/hypopnea index (AHI) was 96.8 per hour. There were 250 total apneas, including 140 obstructive, 4 central and 106 mixed apneas. There were 71 hypopneas and 0 RERAs.  The AHI during Stage REM sleep was 78.3 per hour.  AHI while supine was 97.5 per hour.  The mean oxygen saturation was 87.38%. The minimum SpO2 during sleep was 64.00%.  loud snoring was noted during this study.  CARDIAC DATA The 2 lead EKG demonstrated sinus rhythm. The mean heart rate was 83.95 beats per minute. Other  EKG findings include: None.  LEG MOVEMENT DATA The total PLMS were 0 with a resulting PLMS index of 0.00. Associated arousal with leg movement index was 0.0 .  IMPRESSIONS - Severe obstructive sleep apnea occurred during this study (AHI = 96.8/h). - Sleep was markedly fragmented by apnea with frequent waking. - No significant central sleep apnea occurred during this study (CAI = 1.2/h). - Severe oxygen desaturation was noted during this study (Min O2 = 64.00%). Mean saturation 87.4%. - The patient snored with loud snoring volume. - No cardiac abnormalities were noted during this study. - REM Behavior sleep disorder pattern was noted with frequent moving, "shaking" and talking.  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 Consider Sleep medical referral.  - Supplemental oxygen and specific treatment for RBD should be considered, if not resolved by CPAP. - Be careful with alcohol, sedatives and other CNS depressants that [place] worsen sleep apnea and disrupt normal sleep architecture. - Sleep hygiene should be reviewed to assess factors that [place] improve sleep quality. - Weight management and regular exercise should be initiated or continued if appropriate.  [Electronically signed] 06/22/2019 12:57 PM  Baird Lyons MD, [place], American Board of Sleep Medicine   NPI: [id number]                         [place] [place] [facility]  ELECTRONICALLY SIGNED ON:  06/22/2019, 12:52 PM [place] [employer]: [phone]   FX: [phone] ACCREDITED  BY THE AMERICAN ACADEMY OF SLEEP MEDICINE

## 2019-06-22 NOTE — Progress Notes (Signed)
Pt with peak pressures of 36-40. Rhonchi heard bilaterally. Pt refusing to be suctioned at this time.

## 2019-06-22 NOTE — Progress Notes (Addendum)
NAME:  Lawrence Medina, MRN:  [id number], DOB:  10/13/1966, LOS: 8 ADMISSION DATE:  06/14/2019, CONSULTATION DATE:  06/14/2019 REFERRING MD:  EDP, CHIEF COMPLAINT:  Post arrest    Brief History   53yo male with hx HTN and untreated OSA presented 7/3 to ER after witnessed PEA cardiac arrest. CTA chest revealed bilateral consolidations concerning for extensive CAP, no PE. Had been treated with antibiotics as outpatient. COVID negative.  Remains on vent with high PEEP fio2 requirements. Neurologically intact.   Past Medical History  HTN  GERD  Obesity Untreated OSA  Significant Hospital Events   7/3 admitted 7/10 Weaning PEEP and Fi02. Still at 70%/10. Tells nurse that he had been using cocaine PTA  Consults:  NA  Procedures:  R IJ CVL (ED) 7/3>>> ETT 7/3>>>  Significant Diagnostic Tests:  CT head 7/3>>>neg acute  CTA chest 7/3>>>multifocal PNA  2D echo 7/3>>>diastolic dysfunction, RV okay EEG fine LE duplex- no PE  Micro Data:  Sputum NGTD MRSA swab neg Blood cultures NGT  Antimicrobials:  Zosyn, azithromycin 7/3>>7/4 Augmentin 7/5>  Interim history/subjective:  Remains on the vent.  FiO2 weaned to 50% No acute events.  Objective   Blood pressure 118/89, pulse 90, temperature 98 F (36.7 C), temperature source Oral, resp. rate (!) 25, height 5\' 11"  (1.803 m), weight (!) 148.1 kg, SpO2 93 %.    Vent Mode: PRVC FiO2 (%):  [40 %-60 %] 50 % Set Rate:  [24 bmp] 24 bmp Vt Set:  [600 mL] 600 mL PEEP:  [10 NIO27-03 cmH20] 10 cmH20 Plateau Pressure:  [22 cmH20-24 cmH20] 24 cmH20   Intake/Output Summary (Last 24 hours) at 06/22/2019 0757 Last data filed at 06/22/2019 5009 Gross per 24 hour  Intake 2333.42 ml  Output 3630 ml  Net -1296.58 ml   Filed Weights   06/20/19 0600 06/21/19 0407 06/22/19 0500  Weight: (!) 151.3 kg (!) 150.8 kg (!) 148.1 kg   Physical Exam: Gen:      No acute distress HEENT:  EOMI, sclera anicteric, ETT Neck:     No masses; no thyromegaly  Lungs:    Clear to auscultation bilaterally; normal respiratory effort CV:         Regular rate and rhythm; no murmurs Abd:      + bowel sounds; soft, non-tender; no palpable masses, no distension Ext:    No edema; adequate peripheral perfusion Skin:      Warm and dry; no rash Neuro: Arousable, no focal deficits  Chest x-ray reviewed personally 7/10- improving lung infiltrates  Resolved Hospital Problem list   Septic shock  Assessment & Plan:  53 year old with PEA arrest, respiratory failure likely secondary to pneumonia from aspiration and cocaine use  PEA arrest Neurologically intact.  No evidence of PE or cardiac abnormality Continue supportive care, telemetry monitoring.  Respiratory failure, aspiration pneumonia Pneumonitis from cocaine.  Undiagnosed OSA Continue vent support Reduce PEEP/FiO2 as tolerated. Anticipate PSV weans later today or tomorrow. Lasix for diuresis. Reduce dose today as he is reaching euvolumeia Continue duo nebs Continue Augmentin Will need CPAP when extubated  Bilateral rib fractures Pain control Oxycodone as needed  Acute delirium: resolved Continue seroquel 50 mg BID Wean propofol  Hypernatremia Reduce Lasix dose Continue free water D5W @ 50c/hr  Left leg complex baker's cyst vs. Malignancy- warrants OP workup   Best practice:  Diet: TF Pain/Anxiety/Delirium protocol (if indicated): see discussion above VAP protocol (if indicated): Ordered DVT prophylaxis: Subcu heparin GI prophylaxis: PTA PPI Glucose control:  Sliding scale insulin + lantus Mobility: bedrest Code Status: Full Family Communication: Updated mom daily. Called again and spoke with her 7/11  over telephone Disposition: ICU  The patient is critically ill with multiple organ system failure and requires high complexity decision making for assessment and support, frequent evaluation and titration of therapies, advanced monitoring, review of radiographic studies and  interpretation of complex data.   Critical Care Time devoted to patient care services, exclusive of separately billable procedures, described in this note is 35 minutes.   Marshell Garfinkel MD [place] [facility] Pager [phone] If no answer call [phone] 06/22/2019, 7:57 AM

## 2019-06-23 LAB — CBC
HCT: 43.3 % (ref 39.0–52.0)
Hemoglobin: 13.3 g/dL (ref 13.0–17.0)
MCH: 28.7 pg (ref 26.0–34.0)
MCHC: 30.7 g/dL (ref 30.0–36.0)
MCV: 93.3 fL (ref 80.0–100.0)
Platelets: 305 10*3/uL (ref 150–400)
RBC: 4.64 MIL/uL (ref 4.22–5.81)
RDW: 14.5 % (ref 11.5–15.5)
WBC: 7.8 10*3/uL (ref 4.0–10.5)
nRBC: 0 % (ref 0.0–0.2)

## 2019-06-23 LAB — BASIC METABOLIC PANEL
Anion gap: 9 (ref 5–15)
BUN: 43 mg/dL — ABNORMAL HIGH (ref 6–20)
CO2: 32 mmol/L (ref 22–32)
Calcium: 9.6 mg/dL (ref 8.9–10.3)
Chloride: 105 mmol/L (ref 98–111)
Creatinine, Ser: 1.07 mg/dL (ref 0.61–1.24)
GFR calc Af Amer: >60 mL/min (ref >60)
GFR calc non Af Amer: >60 mL/min (ref >60)
Glucose, Bld: 99 mg/dL (ref 70–99)
Potassium: 3.3 mmol/L — ABNORMAL LOW (ref 3.5–5.1)
Sodium: 146 mmol/L — ABNORMAL HIGH (ref 135–145)

## 2019-06-23 LAB — GLUCOSE, CAPILLARY
Glucose-Capillary: 120 mg/dL — ABNORMAL HIGH (ref 70–99)
Glucose-Capillary: 125 mg/dL — ABNORMAL HIGH (ref 70–99)
Glucose-Capillary: 127 mg/dL — ABNORMAL HIGH (ref 70–99)
Glucose-Capillary: 73 mg/dL (ref 70–99)
Glucose-Capillary: 84 mg/dL (ref 70–99)
Glucose-Capillary: 85 mg/dL (ref 70–99)
Glucose-Capillary: 95 mg/dL (ref 70–99)
Glucose-Capillary: 96 mg/dL (ref 70–99)
Glucose-Capillary: 98 mg/dL (ref 70–99)

## 2019-06-23 LAB — MAGNESIUM: Magnesium: 2.7 mg/dL — ABNORMAL HIGH (ref 1.7–2.4)

## 2019-06-23 LAB — PHOSPHORUS: Phosphorus: 3.8 mg/dL (ref 2.5–4.6)

## 2019-06-23 MED ORDER — CHLORHEXIDINE GLUCONATE 0.12 % MT SOLN: 15.0000 mL | Freq: Two times a day (BID) | OROMUCOSAL | Status: DC

## 2019-06-23 MED ORDER — IPRATROPIUM-ALBUTEROL 0.5-2.5 (3) MG/3ML IN SOLN: 3.0000 mL | Freq: Four times a day (QID) | RESPIRATORY_TRACT | Status: DC | PRN

## 2019-06-23 MED ORDER — POTASSIUM CHLORIDE 20 MEQ/15ML (10%) PO SOLN
40.0000 meq | Freq: Once | ORAL | Status: AC
  Administered 2019-06-23: 06:00:00 40 meq
  Filled 2019-06-23: qty 30

## 2019-06-23 MED ORDER — IPRATROPIUM-ALBUTEROL 0.5-2.5 (3) MG/3ML IN SOLN
3.0000 mL | Freq: Two times a day (BID) | RESPIRATORY_TRACT | Status: DC
  Administered 2019-06-23 (×2): 3 mL via RESPIRATORY_TRACT
  Filled 2019-06-23 (×2): qty 3

## 2019-06-23 MED ORDER — CHLORHEXIDINE GLUCONATE 0.12 % MT SOLN
OROMUCOSAL | Status: AC
  Administered 2019-06-23: 20:00:00 15 mL via OROMUCOSAL
  Filled 2019-06-23: qty 15

## 2019-06-23 MED ORDER — ORAL CARE MOUTH RINSE: 15.0000 mL | Freq: Two times a day (BID) | OROMUCOSAL | Status: DC

## 2019-06-23 NOTE — Progress Notes (Signed)
NAME:  Lawrence Medina, MRN:  [id number], DOB:  [birth year], LOS: 9 ADMISSION DATE:  06/14/2019, CONSULTATION DATE:  06/14/2019 REFERRING MD:  EDP, CHIEF COMPLAINT:  Post arrest    Brief History   53yo male with hx HTN and untreated OSA presented 7/3 to ER after witnessed PEA cardiac arrest. CTA chest revealed bilateral consolidations concerning for extensive CAP, no PE. Had been treated with antibiotics as outpatient. COVID negative.  Remains on vent with high PEEP fio2 requirements. Neurologically intact.   Past Medical History  HTN  GERD  Obesity Untreated OSA  Significant Hospital Events   7/3 admitted 7/10 Weaning PEEP and Fi02. Still at 70%/10. Tells nurse that he had been using cocaine PTA  Consults:  NA  Procedures:  R IJ CVL (ED) 7/3>>> ETT 7/3>>>  Significant Diagnostic Tests:  CT head 7/3>>>neg acute  CTA chest 7/3>>>multifocal PNA  2D echo 7/3>>>diastolic dysfunction, RV okay EEG fine LE duplex- no PE  Micro Data:  Sputum NGTD MRSA swab neg Blood cultures NGT  Antimicrobials:  Zosyn, azithromycin 7/3>>7/4 Augmentin 7/5> 7/10  Interim history/subjective:  Weaning on PSV 5/5 today  Objective   Blood pressure 126/82, pulse 94, temperature 98.5 F (36.9 C), temperature source Axillary, resp. rate (!) 25, height 5\' 11"  (1.803 m), weight (!) 151.7 kg, SpO2 (!) 85 %.    Vent Mode: PRVC FiO2 (%):  [40 %] 40 % Set Rate:  [24 bmp] 24 bmp Vt Set:  [600 mL] 600 mL PEEP:  [8 cmH20-10 cmH20] 8 cmH20 Plateau Pressure:  [21 cmH20-25 cmH20] 22 cmH20   Intake/Output Summary (Last 24 hours) at 06/23/2019 0840 Last data filed at 06/23/2019 0700 Gross per 24 hour  Intake 2457.08 ml  Output 1400 ml  Net 1057.08 ml   Filed Weights   06/21/19 0407 06/22/19 0500 06/23/19 0500  Weight: (!) 150.8 kg (!) 148.1 kg (!) 151.7 kg   Physical Exam: Gen:      No acute distress HEENT:  EOMI, sclera anicteric Neck:     No masses; no thyromegaly, ETT Lungs:    Clear to auscultation  bilaterally; normal respiratory effort CV:         Regular rate and rhythm; no murmurs Abd:      + bowel sounds; soft, non-tender; no palpable masses, no distension Ext:    No edema; adequate peripheral perfusion Skin:      Warm and dry; no rash Neuro: alert and oriented x 3 Psych: normal mood and affect  Chest x-ray reviewed personally 7/10- improving lung infiltrates No new imaging  Resolved Hospital Problem list   Septic shock  Assessment & Plan:  53 year old with PEA arrest, respiratory failure likely secondary to pneumonia from aspiration and cocaine use  PEA arrest Neurologically intact.  No evidence of PE or cardiac abnormality Continue supportive care, telemetry monitoring.  Respiratory failure, aspiration pneumonia Pneumonitis from cocaine.  Undiagnosed OSA Tolerating PSV weans. Plan on extubation Lasix for diuresis.  Continue duo nebs Will need CPAP at night when extuabated  Bilateral rib fractures Pain control Oxycodone as needed  Acute delirium: resolved Continue seroquel 50 mg BID Wean propofol  Hypernatremia Reduce Lasix dose Continue free water D5W @ 50c/hr  Left leg complex baker's cyst vs. Malignancy- warrants OP workup   Best practice:  Diet: TF Pain/Anxiety/Delirium protocol (if indicated): see discussion above VAP protocol (if indicated): Ordered DVT prophylaxis: Subcu heparin GI prophylaxis: PTA PPI Glucose control: Sliding scale insulin + lantus Mobility: bedrest Code Status: Full Family Communication:  Updated mom daily. Called again and spoke with her 7/11  over telephone Disposition: ICU  The patient is critically ill with multiple organ system failure and requires high complexity decision making for assessment and support, frequent evaluation and titration of therapies, advanced monitoring, review of radiographic studies and interpretation of complex data.   Critical Care Time devoted to patient care services, exclusive of separately  billable procedures, described in this note is 35 minutes.   Marshell Garfinkel MD [facility] Pager [phone] If no answer call 336 [phone] 06/23/2019, 8:40 AM   NAME:  Lawrence Medina, MRN:  [id number], DOB:  February 28, 1966, LOS: 9 ADMISSION DATE:  06/14/2019, CONSULTATION DATE:  06/14/2019 REFERRING MD:  EDP, CHIEF COMPLAINT:  Post arrest    Brief History   53yo male with hx HTN and untreated OSA presented 7/3 to ER after witnessed PEA cardiac arrest. CTA chest revealed bilateral consolidations concerning for extensive CAP, no PE. Had been treated with antibiotics as outpatient. COVID negative.  Remains on vent with high PEEP fio2 requirements. Neurologically intact.   Past Medical History  HTN  GERD  Obesity Untreated OSA  Significant Hospital Events   7/3 admitted 7/10 Weaning PEEP and Fi02. Still at 70%/10. Tells nurse that he had been using cocaine PTA  Consults:  NA  Procedures:  R IJ CVL (ED) 7/3>>> ETT 7/3>>>  Significant Diagnostic Tests:  CT head 7/3>>>neg acute  CTA chest 7/3>>>multifocal PNA  2D echo 7/3>>>diastolic dysfunction, RV okay EEG fine LE duplex- no PE  Micro Data:  Sputum NGTD MRSA swab neg Blood cultures NGT  Antimicrobials:  Zosyn, azithromycin 7/3>>7/4 Augmentin 7/5> 7/10  Interim history/subjective:  Remains on the vent.  FiO2 weaned to 50% No acute events.  Objective   Blood pressure 126/82, pulse 94, temperature 98.5 F (36.9 C), temperature source Axillary, resp. rate (!) 25, height 5\' 11"  (1.803 m), weight (!) 151.7 kg, SpO2 (!) 85 %.    Vent Mode: PRVC FiO2 (%):  [40 %] 40 % Set Rate:  [24 bmp] 24 bmp Vt Set:  [600 mL] 600 mL PEEP:  [8 cmH20-10 cmH20] 8 cmH20 Plateau Pressure:  [21 cmH20-25 cmH20] 22 cmH20   Intake/Output Summary (Last 24 hours) at 06/23/2019 0843 Last data filed at 06/23/2019 0700 Gross per 24 hour  Intake 2457.08 ml  Output 1400 ml  Net 1057.08 ml   Filed Weights   06/21/19 0407  06/22/19 0500 06/23/19 0500  Weight: (!) 150.8 kg (!) 148.1 kg (!) 151.7 kg   Physical Exam: Gen:      No acute distress HEENT:  EOMI, sclera anicteric, ETT Neck:     No masses; no thyromegaly Lungs:    Clear to auscultation bilaterally; normal respiratory effort CV:         Regular rate and rhythm; no murmurs Abd:      + bowel sounds; soft, non-tender; no palpable masses, no distension Ext:    No edema; adequate peripheral perfusion Skin:      Warm and dry; no rash Neuro: Arousable, no focal deficits  Chest x-ray reviewed personally 7/10- improving lung infiltrates  Resolved Hospital Problem list   Septic shock  Assessment & Plan:  53 year old with PEA arrest, respiratory failure likely secondary to pneumonia from aspiration and cocaine use  PEA arrest Neurologically intact.  No evidence of PE or cardiac abnormality Continue supportive care, telemetry monitoring.  Respiratory failure, aspiration pneumonia Pneumonitis from cocaine.  Undiagnosed OSA Continue vent support Reduce PEEP/FiO2  as tolerated. Anticipate PSV weans later today or tomorrow. Lasix for diuresis. Reduce dose today as he is reaching euvolumeia Continue duo nebs Continue Augmentin Will need CPAP when extubated  Bilateral rib fractures Pain control Oxycodone as needed  Acute delirium: resolved Continue seroquel 50 mg BID  Hypernatremia Continue free water D5W @ 50c/hr  Left leg complex baker's cyst vs. Malignancy- warrants OP workup   Best practice:  Diet: TF Pain/Anxiety/Delirium protocol (if indicated): see discussion above VAP protocol (if indicated): Ordered DVT prophylaxis: Subcu heparin GI prophylaxis: PTA PPI Glucose control: Sliding scale insulin + lantus Mobility: bedrest Code Status: Full Family Communication: Updated mom daily. Called again and spoke with her 7/12  over telephone Disposition: ICU  The patient is critically ill with multiple organ system failure and requires high  complexity decision making for assessment and support, frequent evaluation and titration of therapies, advanced monitoring, review of radiographic studies and interpretation of complex data.   Critical Care Time devoted to patient care services, exclusive of separately billable procedures, described in this note is 35 minutes.   Marshell Garfinkel MD [facility] Pager [phone] If no answer call 336 [phone] 06/23/2019, 8:43 AM

## 2019-06-23 NOTE — Procedures (Signed)
Extubation Procedure Note  Patient Details:   Name: Lawrence Medina DOB: 1966/02/27 MRN: [id number]   Airway Documentation:    Vent end date: 06/23/19 Vent end time: 1130   Evaluation  O2 sats: stable throughout Complications: No apparent complications Patient did tolerate procedure well. Bilateral Breath Sounds: Diminished, Rhonchi   Yes.  Pt extubated per physician order. Pt suctioned orally and via ETT prior. Pt with good strong cough, able to speak name and no stridor heard. Pt placed on 3L nasal cannula and incentive spirometry started. Pt achieved 1253ml. RT will continue to monitor.   Sharla Kidney 06/23/2019, 11:43 AM

## 2019-06-23 NOTE — Progress Notes (Signed)
eLink Physician-Brief Progress Note Patient Name: Reagan Nordgren DOB: 1966/08/08 MRN: [id number]   Date of Service  06/23/2019  HPI/Events of Note  Notified of hypokalemia with K 3.3. Crea stable.  eICU Interventions  KCl 50meq ordered via OGT.     Intervention Category Minor Interventions: Electrolytes abnormality - evaluation and management  Elsie Lincoln 06/23/2019, 5:14 AM

## 2019-06-24 ENCOUNTER — Inpatient Hospital Stay (HOSPITAL_COMMUNITY): Payer: Medicare Other

## 2019-06-24 DIAGNOSIS — G4733 Obstructive sleep apnea (adult) (pediatric): Secondary | ICD-10-CM

## 2019-06-24 DIAGNOSIS — F1410 Cocaine abuse, uncomplicated: Secondary | ICD-10-CM

## 2019-06-24 LAB — GLUCOSE, CAPILLARY
Glucose-Capillary: 101 mg/dL — ABNORMAL HIGH (ref 70–99)
Glucose-Capillary: 110 mg/dL — ABNORMAL HIGH (ref 70–99)
Glucose-Capillary: 125 mg/dL — ABNORMAL HIGH (ref 70–99)
Glucose-Capillary: 136 mg/dL — ABNORMAL HIGH (ref 70–99)
Glucose-Capillary: 159 mg/dL — ABNORMAL HIGH (ref 70–99)
Glucose-Capillary: 83 mg/dL (ref 70–99)
Glucose-Capillary: 92 mg/dL (ref 70–99)

## 2019-06-24 LAB — CBC
HCT: 42.3 % (ref 39.0–52.0)
Hemoglobin: 13.7 g/dL (ref 13.0–17.0)
MCH: 29 pg (ref 26.0–34.0)
MCHC: 32.4 g/dL (ref 30.0–36.0)
MCV: 89.4 fL (ref 80.0–100.0)
Platelets: 292 10*3/uL (ref 150–400)
RBC: 4.73 MIL/uL (ref 4.22–5.81)
RDW: 14 % (ref 11.5–15.5)
WBC: 9.6 10*3/uL (ref 4.0–10.5)
nRBC: 0 % (ref 0.0–0.2)

## 2019-06-24 LAB — BASIC METABOLIC PANEL
Anion gap: 11 (ref 5–15)
BUN: 35 mg/dL — ABNORMAL HIGH (ref 6–20)
CO2: 29 mmol/L (ref 22–32)
Calcium: 9.2 mg/dL (ref 8.9–10.3)
Chloride: 101 mmol/L (ref 98–111)
Creatinine, Ser: 0.98 mg/dL (ref 0.61–1.24)
GFR calc Af Amer: >60 mL/min (ref >60)
GFR calc non Af Amer: >60 mL/min (ref >60)
Glucose, Bld: 88 mg/dL (ref 70–99)
Potassium: 3.3 mmol/L — ABNORMAL LOW (ref 3.5–5.1)
Sodium: 141 mmol/L (ref 135–145)

## 2019-06-24 LAB — PHOSPHORUS: Phosphorus: 3.6 mg/dL (ref 2.5–4.6)

## 2019-06-24 LAB — MAGNESIUM: Magnesium: 2.2 mg/dL (ref 1.7–2.4)

## 2019-06-24 MED ORDER — POTASSIUM CHLORIDE CRYS ER 20 MEQ PO TBCR
20.0000 meq | EXTENDED_RELEASE_TABLET | ORAL | Status: AC
  Administered 2019-06-24 (×2): 20 meq via ORAL
  Filled 2019-06-24 (×2): qty 1

## 2019-06-24 MED ORDER — ACETAMINOPHEN 325 MG PO TABS
650.0000 mg | ORAL_TABLET | Freq: Four times a day (QID) | ORAL | Status: DC | PRN
  Administered 2019-06-24 – 2019-06-25 (×5): 650 mg via ORAL
  Filled 2019-06-24 (×5): qty 2

## 2019-06-24 MED ORDER — ENSURE ENLIVE PO LIQD
237.0000 mL | Freq: Two times a day (BID) | ORAL | Status: DC
  Administered 2019-06-24 – 2019-06-25 (×3): 237 mL via ORAL

## 2019-06-24 NOTE — Progress Notes (Signed)
[facility] ADULT ICU REPLACEMENT PROTOCOL FOR AM LAB REPLACEMENT ONLY  The patient does apply for the [facility] Adult ICU Electrolyte Replacment Protocol based on the criteria listed below:   1. Is GFR >/= 40 ml/min? Yes.    Patient's GFR today is >60 2. Is urine output >/= 0.5 ml/kg/hr for the last 6 hours? Yes.   Patient's UOP is 1.5 ml/kg/hr 3. Is BUN < 60 mg/dL? Yes.    Patient's BUN today is 35 4. Abnormal electrolyte(s): K+ 3.3 5. Ordered repletion with: protocol 6. If a panic level lab has been reported, has the CCM MD in charge been notified? Yes.  .   Physician:  Hulan Fray  Carlisle Beers 06/24/2019 5:24 AM

## 2019-06-24 NOTE — Progress Notes (Signed)
CPAP set up and patient placed on auto titrate settings (max 20, min 6) cm H2O via FFM.

## 2019-06-24 NOTE — Progress Notes (Addendum)
NAME:  Lawrence Medina, MRN:  [id number], DOB:  12-28-1965, LOS: 10 ADMISSION DATE:  06/14/2019, CONSULTATION DATE:  06/14/2019 REFERRING MD:  EDP, CHIEF COMPLAINT:  Post arrest    Brief History   53yo male with hx HTN and untreated OSA presented 7/3 to ER after witnessed PEA cardiac arrest. CTA chest revealed bilateral consolidations concerning for extensive CAP, no PE. Had been treated with antibiotics as outpatient. COVID negative.   Past Medical History  HTN  GERD  Obesity Untreated OSA  Significant Hospital Events   7/3 admitted 7/10 Weaning PEEP and Fi02. Still at 70%/10. Tells nurse that he had been using cocaine PTA  Consults:  NA  Procedures:  R IJ CVL (ED) 7/3>>> ETT 7/3>>>  Significant Diagnostic Tests:  CT head 7/3>>>neg acute  CTA chest 7/3>>>multifocal PNA  2D echo 7/3>>>diastolic dysfunction, RV okay EEG fine LE duplex- no PE PSG 6/27 > consistent with severe OSA. AHI 96.8  Micro Data:  Sputum NGTD MRSA swab neg Blood cultures NGT  Antimicrobials:  Zosyn, azithromycin 7/3>>7/4 Augmentin 7/5> 7/10  Interim history/subjective:  Extubated 7/12 tolerating well.   Objective   Blood pressure (!) 126/92, pulse (!) 103, temperature 98.9 F (37.2 C), temperature source Oral, resp. rate 16, height 5\' 11"  (1.803 m), weight (!) 140 kg, SpO2 95 %.    Vent Mode: CPAP;PSV FiO2 (%):  [40 %] 40 % PEEP:  [5 cmH20] 5 cmH20 Pressure Support:  [5 cmH20] 5 cmH20   Intake/Output Summary (Last 24 hours) at 06/24/2019 0814 Last data filed at 06/24/2019 0700 Gross per 24 hour  Intake 2154.91 ml  Output 2300 ml  Net -145.09 ml   Filed Weights   06/22/19 0500 06/23/19 0500 06/24/19 0500  Weight: (!) 148.1 kg (!) 151.7 kg (!) 140 kg   Physical Exam: General:  Obese middle aged male in NAD Neuro:  Alert, oriented, non-focal HEENT:  [place]/AT, No JVD noted, PERRL Cardiovascular:  RRR, no MRG Lungs:  Clear bilateral breath sounds Abdomen:  Soft, non-distended Musculoskeletal:   No acute deformity Skin:  Intact, MMM   Resolved Hospital Problem list   Septic shock  Assessment & Plan:  53 year old with PEA arrest, respiratory failure likely secondary to pneumonia from aspiration and cocaine use  PEA arrest Neurologically intact.  No evidence of PE or cardiac abnormality Continue supportive care, telemetry monitoring. Crack cessation given.   Respiratory failure, aspiration pneumonia. S/p 7d augmentin Pneumonitis from cocaine.  Severe OSA by sleep study 6/27 Lasix for diuresis scheduled.  Continue duo nebs CPAP qhs autotitirate  Bilateral rib fractures Pain control Oxycodone as needed  Acute delirium: resolved Continue seroquel 50 mg BID  Hypernatremia: improved.   Hypoglycemia: Continue d50, Spira be able to DC now that he has a diet Hold lantus as needed.    Left leg complex baker's cyst vs. Malignancy- warrants OP workup   Best practice:  Diet: heart healthy Pain/Anxiety/Delirium protocol (if indicated): NA VAP protocol (if indicated): Ordered DVT prophylaxis: Subcu heparin GI prophylaxis: PTA PPI Glucose control: Sliding scale insulin + lantus Mobility: bedrest Code Status: Full Family Communication: Updated mom daily. Left message Disposition: ICU > PCU   Georgann Housekeeper, AGACNP-BC [place] Pager [phone] or [phone]  06/24/2019 8:30 AM  Attending Note:  53 year old male with cocaine abuse history who suffered a cardiac arrest after using cocaine with PEA.  No events overnight, continue to complain of generalized pain.  On exam, he is alert and interactive, moving all ext  to commands with clear lungs.  Discussed with PCCM-NP.  Acute respiratory failure:  - Monitor for airway protection  Hypoxemia:  - Titrate O2 for sat of 88-92%  - Celani need an ambulatory desaturation study prior to discharge for home O2  PEA:  - Cards following, appreciate input  OSA:  - CPAP  - Encouraged use at home as  well to unload heart  Cocaine abuse:  - Advised against use given cardiac condition  Transfer patient out of the ICU and to [facility] service with PCCM off 7/14  Patient seen and examined, agree with above note.  I dictated the care and orders written for this patient under my direction.  Rush Farmer, [facility]

## 2019-06-24 NOTE — Progress Notes (Signed)
Placed pt on CPAP auto with max pressure of 20 and min pressure of 8 with 4L bled in. Pt tolerating well RT to cont to monitor

## 2019-06-24 NOTE — Progress Notes (Signed)
Nutrition Follow-up   RD working remotely.  DOCUMENTATION CODES:   Morbid obesity  INTERVENTION:   - Ensure Enlive po BID, each supplement provides 350 kcal and 20 grams of protein  NUTRITION DIAGNOSIS:   Inadequate oral intake related to inability to eat as evidenced by NPO status.  Progressing, diet has been advanced to Heart Healthy/Carb Modified  GOAL:   Patient will meet greater than or equal to 90% of their needs  Progressing  MONITOR:   PO intake, Supplement acceptance, Labs, I & O's, Weight trends  REASON FOR ASSESSMENT:   Consult Enteral/tube feeding initiation and management  ASSESSMENT:   53 y/o male PMHx HTN, morbid obesity and OSA. Suffered PEA while in car with family. Received epi/cpr and intubated by EMS. In ED, CTA shows bilateral consolidations concerning for extensive CAP. Has bilateral rib fxs from CPR.  7/12 - extubated, diet advanced to full liquids 7/13 - diet advanced to Heart Healthy/Carb Modified  Weight down a total of 44 lbs since first measured admission weight. Per RN documentation, pt with non-pitting edema to BUE and BLE.  RD will order an oral nutrition supplement to aid pt in meeting kcal and protein needs. No meal completion recorded since pt's diet has been advanced.  Medications reviewed and include: Lasix 40 mg daily, SSI, Lantus 10 units BID, Protonix, K-dur 20 mEq x 2 doses  Labs reviewed: potassium 3.3 CBG's: 73-159 x 24 hours  UOP: 2300 ml x 24 hours I/O's: -5.1 L since admit  Diet Order:   Diet Order            Diet heart healthy/carb modified Room service appropriate? Yes; Fluid consistency: Thin  Diet effective now              EDUCATION NEEDS:   No education needs have been identified at this time  Skin:  Skin Assessment: Reviewed RN Assessment  Last BM:  06/23/19  Height:   Ht Readings from Last 1 Encounters:  06/15/19 5\' 11"  (1.803 m)    Weight:   Wt Readings from Last 1 Encounters:  06/24/19  (!) 140 kg    Ideal Body Weight:  78.18 kg  BMI:  Body mass index is 43.05 kg/m.  Estimated Nutritional Needs:   Kcal:  4008-6761  Protein:  110-125 grams  Fluid:  >/= 1.8 L    Gaynell Face, MS, RD, LDN Inpatient Clinical Dietitian Pager: [phone] Weekend/After Hours: [phone]

## 2019-06-25 ENCOUNTER — Telehealth: Payer: Self-pay | Admitting: Internal Medicine

## 2019-06-25 DIAGNOSIS — N179 Acute kidney failure, unspecified: Secondary | ICD-10-CM

## 2019-06-25 DIAGNOSIS — E876 Hypokalemia: Secondary | ICD-10-CM

## 2019-06-25 DIAGNOSIS — I10 Essential (primary) hypertension: Secondary | ICD-10-CM

## 2019-06-25 LAB — GLUCOSE, CAPILLARY
Glucose-Capillary: 104 mg/dL — ABNORMAL HIGH (ref 70–99)
Glucose-Capillary: 116 mg/dL — ABNORMAL HIGH (ref 70–99)
Glucose-Capillary: 146 mg/dL — ABNORMAL HIGH (ref 70–99)
Glucose-Capillary: 91 mg/dL (ref 70–99)
Glucose-Capillary: 93 mg/dL (ref 70–99)

## 2019-06-25 LAB — CBC
HCT: 43.5 % (ref 39.0–52.0)
Hemoglobin: 14.6 g/dL (ref 13.0–17.0)
MCH: 29 pg (ref 26.0–34.0)
MCHC: 33.6 g/dL (ref 30.0–36.0)
MCV: 86.5 fL (ref 80.0–100.0)
Platelets: 300 10*3/uL (ref 150–400)
RBC: 5.03 MIL/uL (ref 4.22–5.81)
RDW: 13.7 % (ref 11.5–15.5)
WBC: 9.6 10*3/uL (ref 4.0–10.5)
nRBC: 0 % (ref 0.0–0.2)

## 2019-06-25 LAB — BASIC METABOLIC PANEL
Anion gap: 14 (ref 5–15)
BUN: 31 mg/dL — ABNORMAL HIGH (ref 6–20)
CO2: 24 mmol/L (ref 22–32)
Calcium: 9.1 mg/dL (ref 8.9–10.3)
Chloride: 99 mmol/L (ref 98–111)
Creatinine, Ser: 1.04 mg/dL (ref 0.61–1.24)
GFR calc Af Amer: >60 mL/min (ref >60)
GFR calc non Af Amer: >60 mL/min (ref >60)
Glucose, Bld: 95 mg/dL (ref 70–99)
Potassium: 3.8 mmol/L (ref 3.5–5.1)
Sodium: 137 mmol/L (ref 135–145)

## 2019-06-25 LAB — MAGNESIUM: Magnesium: 2.3 mg/dL (ref 1.7–2.4)

## 2019-06-25 MED ORDER — ACETAMINOPHEN 325 MG PO TABS: 650.0000 mg | ORAL_TABLET | Freq: Four times a day (QID) | ORAL | 0 refills | Status: DC | PRN

## 2019-06-25 MED ORDER — PANTOPRAZOLE SODIUM 40 MG PO TBEC
40.0000 mg | DELAYED_RELEASE_TABLET | Freq: Every day | ORAL | Status: DC
  Administered 2019-06-25: 40 mg via ORAL
  Filled 2019-06-25: qty 1

## 2019-06-25 MED ORDER — CALAMINE EX LOTN
TOPICAL_LOTION | CUTANEOUS | Status: DC | PRN
  Filled 2019-06-25: qty 177

## 2019-06-25 NOTE — Discharge Summary (Addendum)
Physician Discharge Summary  Lawrence Medina VCB:[id number] DOB: Mar 23, 1966 DOA: 06/14/2019  PCP: Patient, No Pcp Per  Admit date: 06/14/2019 Discharge date: 06/25/2019  Admitted From: Home  Disposition:  Home   Recommendations for Outpatient Follow-up and new medication changes:  1. Follow up with Primary Care in 7 days.  2. Hold on clonazepam and cyclobenzaprine.  3. Discontinue clonazepam and cyclobenzaprine.  4. Hold on furosemide and potassium supplementsfor now.  5. Hold on hydralazine and amlodipine.   Home Health: no   Equipment/Devices: C pap  Discharge Condition: stable CODE STATUS:full  Diet recommendation: heart healthy and diabetic prudent.   Brief/Interim Summary: 53 year old male who presented with cardiac arrest.  He does have significant past medical history for hypertension, and obstructive sleep apnea.  Apparently he was riding in the car with his family when suddenly complained of feeling ill, became unresponsive and his lost his pulse.  He had immediate CPR by his family and then EMS.  He had advanced airway placed, and he recovered spontaneous circulation after 4 rounds of CPR.  Patient required epinephrine and norepinephrine drips.  His blood pressure was 117/78, heart rate 95, temperature 97.4, respiratory rate 20, oxygen saturation 97% 100% FiO2/PEEP of 10 mechanical ventilation.  His lungs were clear to auscultation, heart S1-S2 present, distant, abdomen protuberant, positive bilateral extremity edema.  Sodium 138, potassium 2.9, chloride 106, bicarb 18, glucose 278, BUN 14, creatinine 1.7, white count 13.6, hemoglobin 14.9, hematocrit 47.3, platelets 252.  SARS COVID-19 negative.  Head CT negative for acute changes.  Chest radiograph cardiomegaly.  CT scan negative for pulmonary embolism, bilateral lower lobes and posterior aspects of upper lobe infiltrates.  Right 3rd to 6th anterior rib fractures, left 2nd-5th anterolateral rib fractures.   Patient was admitted to the  intensive care unit with a working diagnosis of post cardiac arrest, circulatory failure.   1.  Status post pulseless electrical activity cardiac arrest.  Likely multifactorial, patient using benzodiazepines, and also using THC.  Patient received supportive medical therapy, he was extubated July 12 with no significant neurologic deficit.  Further work-up with echocardiography showed preserved LV systolic function.  2.  Acute hypoxic/hypercarbic respiratory failure due to bilateral pneumonia, aspiration pneumonia, present on admission.  Patient received antibiotic therapy with Zosyn, azithromycin and posterior lead changed to Augmentin.  He completed antibiotic therapy while hospitalized with good toleration.  His oxygen saturation at discharge is 95%.  3.  Metabolic encephalopathy with delirium.  Patient received supportive medical therapy with improvement.  4.  Acute kidney injury with hypernatremia and hypokalemia.  Patient received IV fluids, electrolytes were corrected, at discharge sodium 137, potassium 3.8, bicarb 24, creatinine 1.0.   5.  Bilateral hip fractures.  Continue pain control with acetaminophen.  6.  Transitory hypoglycemia with pre-diabetes.  Improved with supportive therapy. Hgb A1c was 5,7.   7. OSA. Patient reports having a sleep study performed as an outpatient, will order CPAP.  8. HTN. Resume blood pressure control remained controlled with no antihypertensive medications, at discharge will resume only lisinopril.   9. Dyslipidemia. Continue lovastatin.   Discharge Diagnoses:  Active Problems:   Cardiac arrest ([place])   Acute respiratory failure with hypoxia ([facility])   AKI (acute kidney injury) ([place])   Hypokalemia   Essential hypertension    Discharge Instructions   Allergies as of 06/25/2019   No Known Allergies     Medication List    STOP taking these medications   clonazePAM 0.5 MG tablet Commonly known as: Bobbye [place]  cyclobenzaprine 10 MG  tablet Commonly known as: FLEXERIL   furosemide 20 MG tablet Commonly known as: LASIX   potassium chloride 10 MEQ tablet Commonly known as: K-DUR     TAKE these medications   acetaminophen 325 MG tablet Commonly known as: TYLENOL Take 2 tablets (650 mg total) by mouth every 6 (six) hours as needed for mild pain.   amLODipine 10 MG tablet Commonly known as: NORVASC Take 10 mg by mouth daily.   doxepin 25 MG capsule Commonly known as: SINEQUAN Take 75 mg by mouth at bedtime as needed (*Gabriel gradually increase to a max total of 12 capsules (300mg ) in every 24 hours).   hydrALAZINE 25 MG tablet Commonly known as: APRESOLINE Take 25 mg by mouth 2 (two) times a day.   lisinopril 40 MG tablet Commonly known as: ZESTRIL Take 40 mg by mouth daily.   lovastatin 20 MG tablet Commonly known as: MEVACOR Take 20 mg by mouth daily with supper.   nitroGLYCERIN 0.4 MG SL tablet Commonly known as: NITROSTAT Place 0.4 mg under the tongue every 5 (five) minutes as needed for chest pain.       No Known Allergies  Consultations:     Procedures/Studies: Dg Chest 1 View  Result Date: 06/16/2019 CLINICAL DATA:  53 year old male with acute respiratory failure and hypoxia EXAM: CHEST  1 VIEW COMPARISON:  Prior chest x-ray 06/14/2019; CT scan of the chest 06/14/2019 FINDINGS: The endotracheal tube is 3.4 cm above the carina. A gastric tube is present. The tip is not identified but lies off the field of view, below the diaphragm and presumably in the stomach. Right IJ central venous catheter is present. The tip overlies the superior cavoatrial junction. Inspiratory volumes remain extremely low. There has been a significant improvement in aeration in the upper lungs. Persistent left greater than right bibasilar airspace opacities. No acute osseous abnormality. No evidence of pneumothorax. IMPRESSION: 1. Stable and satisfactory support apparatus. 2. Significant interval improvement in bilateral  upper lobe aeration compared to 06/14/2019. 3. Very low inspiratory volumes with nonspecific left greater than right basilar airspace opacities. These opacities Yaney reflect atelectasis and/or infiltrate. Small pleural effusions are not excluded. Electronically Signed   By: Jacqulynn Cadet M.D.   On: 06/16/2019 08:24   Ct Head Wo Contrast  Result Date: 06/14/2019 CLINICAL DATA:  Altered level of consciousness. Post CPR. EXAM: CT HEAD WITHOUT CONTRAST TECHNIQUE: Contiguous axial images were obtained from the base of the skull through the vertex without intravenous contrast. COMPARISON:  None. FINDINGS: Brain: No intracranial hemorrhage, mass effect, or midline shift. No evidence of cerebral edema, gray-white differentiation is preserved. No hydrocephalus. The basilar cisterns are patent. No evidence of territorial infarct or acute ischemia. No extra-axial or intracranial fluid collection. Vascular: Slight generalized increased density in the vascular structures without focal hyperdense vessel. Skull: No fracture or focal lesion. Sinuses/Orbits: Slight mucosal thickening of ethmoid air cells Tijerino be related to intubation. No sinus fluid level. Mastoid air cells are clear. Other: Bilateral parotid enlargement, partially included. IMPRESSION: 1. No acute intracranial abnormality. 2. Bilateral parotid gland enlargement is partially included. Electronically Signed   By: Keith Rake M.D.   On: 06/14/2019 21:47   Ct Angio Chest Pe W And/or Wo Contrast  Result Date: 06/14/2019 CLINICAL DATA:  Post CPR EXAM: CT ANGIOGRAPHY CHEST WITH CONTRAST TECHNIQUE: Multidetector CT imaging of the chest was performed using the standard protocol during bolus administration of intravenous contrast. Multiplanar CT image reconstructions and MIPs were  obtained to evaluate the vascular anatomy. CONTRAST:  63mL OMNIPAQUE IOHEXOL 350 MG/ML SOLN COMPARISON:  Chest x-ray 06/14/2019 FINDINGS: Cardiovascular: Limited evaluation for  pulmonary emboli secondary to poor opacification of the subsegmental and distal vessels within all lobes of the lung. There is no acute central embolus visualized. The aorta is nonaneurysmal. Heart size is normal. No pericardial effusion Mediastinum/Nodes: Midline trachea. Endotracheal tube tip terminates 2 cm superior to the carina. Esophageal tube tip terminates in the gastric body. No significantly enlarged lymph nodes. Lungs/Pleura: Extensive consolidation within the bilateral lower lobes and posterior aspects of both upper lobes. No pneumothorax. No significant effusion. Upper Abdomen: No acute abnormality. Musculoskeletal: Acute appearing right third, fourth, fifth, and sixth anterior rib fractures. Probable acute left second, fifth, sixth, and seventh anterolateral rib fractures. Irregularity and posterior sclerosis at the left fourth, fifth, sixth, seventh, eighth, ninth, tenth ribs possibly due to remote fractures. Review of the MIP images confirms the above findings. IMPRESSION: 1. Negative for acute central pulmonary embolus. Subsegmental and distal vessels are poorly opacified and evaluation for PE is therefore limited in these regions. 2. Extensive consolidations within the bilateral lower lobes and posterior aspects of the upper lobes, which Beadle be due to multifocal pneumonia. 3. Acute appearing right third through sixth anterior rib fractures and probable acute left second and fifth through seventh anterolateral rib fractures. Electronically Signed   By: Donavan Foil M.D.   On: 06/14/2019 21:58   Dg Chest Port 1 View  Result Date: 06/24/2019 CLINICAL DATA:  53 year old male with shortness of breath and acute respiratory failure EXAM: PORTABLE CHEST 1 VIEW COMPARISON:  Prior chest x-ray 06/21/2019 FINDINGS: The patient has been extubated and the gastric tube removed. Additionally, the right IJ central venous catheter has been removed. Stable cardiomegaly and mediastinal contours. Persistent  pulmonary vascular congestion without overt edema. Small bilateral layering pleural effusions and associated bibasilar airspace opacities. No pneumothorax. No acute osseous abnormality. IMPRESSION: 1. Interval removal of support apparatus. 2. Stable cardiomegaly and mild vascular congestion without overt edema. 3. Small bilateral pleural effusions and associated bibasilar atelectasis. Electronically Signed   By: Jacqulynn Cadet M.D.   On: 06/24/2019 07:49   Dg Chest Port 1 View  Result Date: 06/21/2019 CLINICAL DATA:  Intubation.  Respiratory failure. EXAM: PORTABLE CHEST 1 VIEW COMPARISON:  06/20/2019. FINDINGS: Endotracheal tube, NG tube, right IJ line in stable position. Stable cardiomegaly. Persistent left base atelectasis and infiltrate. Improved right base atelectasis. Tiny bilateral pleural effusions again noted. No pneumothorax. IMPRESSION: 1.  Lines and tubes in stable position. 2.  Stable cardiomegaly. 3. Persistent left base atelectasis and infiltrate. Improved right base atelectasis. Tiny bilateral pleural effusions again noted. Electronically Signed   By: Marcello Moores  Register   On: 06/21/2019 06:33   Dg Chest Port 1 View  Result Date: 06/20/2019 CLINICAL DATA:  Intubation.  Respiratory failure. EXAM: PORTABLE CHEST 1 VIEW COMPARISON:  06/18/2019.  Chest CT 06/14/2019. FINDINGS: Endotracheal tube, NG tube, right IJ line stable position. Stable cardiomegaly. Persistent bibasilar atelectasis/infiltrates with improved aeration from prior exam. Tiny bilateral pleural effusions again noted. No pneumothorax.Rib fractures best identified by prior CT. IMPRESSION: 1.  Lines and tubes in stable position.  No pneumothorax. 2.  Stable cardiomegaly. 3. Persistent bibasilar atelectasis/infiltrates with improved aeration from prior exam. Tiny bilateral pleural effusions again noted. 4.  Rib fractures best identified by prior CT. Electronically Signed   By: Marcello Moores  Register   On: 06/20/2019 06:20   Dg Chest Port  1 [address]  Result Date: 06/18/2019 CLINICAL DATA:  Hypoxia. Follow-up exam. Respiratory failure. Intubated patient. EXAM: PORTABLE CHEST 1 VIEW COMPARISON:  06/17/2019 and prior studies. FINDINGS: Endotracheal tube, right internal jugular central venous line and nasal/orogastric tube are stable and well positioned. There is persistent consolidation at the left lung base with the hemidiaphragm and portions of the left heart border obscured. Milder opacity is noted at the right lung base. Mid to upper lungs are relatively clear, reflecting improvement when compared to the chest radiograph dated 06/14/2019. No new lung abnormalities. Pleural fusions noted on the prior CT are not well-defined on this semi-erect AP portable. They Dickman be decreased in size. No evidence of a pneumothorax. IMPRESSION: 1. There has been overall improvement in lung aeration when comparing back to the original chest radiograph from 06/14/2019. Mid and upper lungs are essentially clear. There is persistent consolidation in the lower lungs, left greater than right, which Ryder reflect residual pneumonia, atelectasis or a combination. There are probable residual pleural effusions suspected to be reduced in size from the prior CT from 06/14/2019. 2. No new abnormalities. 3. Support apparatus is stable. Electronically Signed   By: Lajean Manes M.D.   On: 06/18/2019 20:07   Dg Chest Port 1 View  Result Date: 06/17/2019 CLINICAL DATA:  Respiratory failure. EXAM: PORTABLE CHEST 1 VIEW COMPARISON:  06/16/2019. FINDINGS: Endotracheal tube, NG tube, right IJ line stable position. Cardiomegaly with normal pulmonary vascularity. Bibasilar atelectasis. Left base infiltrate again cannot be excluded. No interim change. Small left pleural effusion again noted. IMPRESSION: Low lung volumes with bibasilar atelectasis. Left base infiltrate again cannot be excluded. No interim change. Small left pleural effusion again noted. Electronically Signed   By: Marcello Moores   Register   On: 06/17/2019 09:18   Dg Chest Portable 1 View  Result Date: 06/14/2019 CLINICAL DATA:  Placement of central line. EXAM: PORTABLE CHEST 1 VIEW COMPARISON:  June 14, 2019 FINDINGS: The ETT is in good position. An NG tube terminates below today's film. A new right central line terminates in the central SVC. No pneumothorax. Bilateral pulmonary opacities persists but is significantly improved on the right but are stable on the left. No other interval changes. IMPRESSION: 1. Bilateral pulmonary opacities persist, significantly improved on the right and stable on the left. 2. Support apparatus as above. The new right central line is in good position with no pneumothorax. 3. No other abnormalities. Electronically Signed   By: Dorise Bullion III M.D   On: 06/14/2019 21:28   Dg Chest Portable 1 View  Result Date: 06/14/2019 CLINICAL DATA:  CPR EXAM: PORTABLE CHEST 1 VIEW COMPARISON:  None. FINDINGS: Endotracheal tube tip is about 2.8 cm superior to the carina. Esophageal tube tip below the diaphragm but non included. Extensive bilateral ground-glass opacity and consolidation. Possible trace right effusion. Mild cardiomegaly. No pneumothorax. Possible acute left seventh and eighth rib fractures. IMPRESSION: 1. Endotracheal tube tip about 2.8 cm superior to carina. Esophageal tube tip is below the diaphragm 2. Extensive bilateral ground-glass opacities and consolidations which C[place] be secondary to diffuse edema, pneumonia(including atypical or viral), hemorrhage, or ARDS. 3. Possible left seventh and eighth rib fractures. Electronically Signed   By: Donavan Foil M.D.   On: 06/14/2019 19:52   Vas [name] Lower Extremity Venous (dvt)  Result Date: 06/15/2019  Lower Venous Study Indications: Recent PEA arrest, and Swelling.  Limitations: Bandages, poor ultrasound/tissue interface and body habitus. Comparison Study: No prior study Performing Technologist: Maudry Mayhew MHA, RDMS, RVT, RDCS  Examination  Guidelines: A complete evaluation includes B-mode imaging, spectral Doppler, color Doppler, and power Doppler as needed of all accessible portions of each vessel. Bilateral testing is considered an integral part of a complete examination. Limited examinations for reoccurring indications Begley be performed as noted.  +---------+---------------+---------+-----------+----------+--------------+ RIGHT    CompressibilityPhasicitySpontaneityPropertiesSummary        +---------+---------------+---------+-----------+----------+--------------+ CFV                                                   Not visualized +---------+---------------+---------+-----------+----------+--------------+ SFJ                                                   Not visualized +---------+---------------+---------+-----------+----------+--------------+ FV Prox                                               Not visualized +---------+---------------+---------+-----------+----------+--------------+ FV Mid   Full                                                        +---------+---------------+---------+-----------+----------+--------------+ FV DistalFull                                                        +---------+---------------+---------+-----------+----------+--------------+ PFV                                                   Not visualized +---------+---------------+---------+-----------+----------+--------------+ POP      Full           Yes      Yes                                 +---------+---------------+---------+-----------+----------+--------------+ PTV      Full                    Yes                                 +---------+---------------+---------+-----------+----------+--------------+ PERO     Full                    Yes                                 +---------+---------------+---------+-----------+----------+--------------+    +---------+---------------+---------+-----------+----------+-------+ LEFT     CompressibilityPhasicitySpontaneityPropertiesSummary +---------+---------------+---------+-----------+----------+-------+ CFV      Full           Yes  Yes                          +---------+---------------+---------+-----------+----------+-------+ SFJ      Full                                                 +---------+---------------+---------+-----------+----------+-------+ FV Prox  Full                                                 +---------+---------------+---------+-----------+----------+-------+ FV Mid   Full                                                 +---------+---------------+---------+-----------+----------+-------+ FV DistalFull                                                 +---------+---------------+---------+-----------+----------+-------+ PFV      Full                                                 +---------+---------------+---------+-----------+----------+-------+ POP      Full           Yes      Yes                          +---------+---------------+---------+-----------+----------+-------+ PTV      Full                                                 +---------+---------------+---------+-----------+----------+-------+ PERO     Full                                                 +---------+---------------+---------+-----------+----------+-------+     Summary: Right: There is no evidence of deep vein thrombosis in the lower extremity. However, portions of this examination were limited- see technologist comments above. No cystic structure found in the popliteal fossa. Left: There is no evidence of deep vein thrombosis in the lower extremity. There is a heterogenous area in the popliteal fossa measuring 4.5 x 1.6 x 4.0cm. This area has internal echoes and peripheral low-resistant vascularity, suggestive of possible malignancy versus complex  Baker's cyst versus unknown etiology. To be thorough, the left popliteal artery was briefly evaluated and found to have triphasic flow.  *See table(s) above for measurements and observations. Electronically signed by Servando Snare MD on 06/15/2019 at 12:37:14 PM.    Final       Procedures:   Subjective: Patient is feeling well, no dyspnea, chest  pain is controlled with analgesics, no nausea or vomiting.   Discharge Exam: Vitals:   06/25/19 0457 06/25/19 1323  BP: (!) 120/98 123/88  Pulse: 86 86  Resp: 15 20  Temp: 97.6 F (36.4 C) 97.7 F (36.5 C)  SpO2: 95% 95%   Vitals:   06/24/19 2003 06/24/19 2225 06/25/19 0457 06/25/19 1323  BP: 122/89  (!) 120/98 123/88  Pulse: 99 99 86 86  Resp: 15 20 15 20   Temp: 97.7 F (36.5 C)  97.6 F (36.4 C) 97.7 F (36.5 C)  TempSrc: Oral  Oral Oral  SpO2: 98% 95% 95% 95%  Weight:   (!) 148.7 kg   Height:        General: Not in pain or dyspnea  Neurology: Awake and alert, non focal  E ENT: no pallor, no icterus, oral mucosa moist Cardiovascular: No JVD. S1-S2 present, rhythmic, no gallops, rubs, or murmurs. No lower extremity edema. Pulmonary: positive breath sounds bilaterally, positive air movement, no wheezing or rhonchi mild rales at bases. Gastrointestinal. Abdomen protuberant with no organomegaly, non tender, no rebound or guarding Skin. No rashes Musculoskeletal: no joint deformities   The results of significant diagnostics from this hospitalization (including imaging, microbiology, ancillary and laboratory) are listed below for reference.     Microbiology: Recent Results (from the past 240 hour(s))  Culture, respiratory (non-expectorated)     Status: None   Collection Time: 06/20/19  9:11 AM   Specimen: Tracheal Aspirate; Respiratory  Result Value Ref Range Status   Specimen Description TRACHEAL ASPIRATE  Final   Special Requests NONE  Final   Gram Stain   Final    ABUNDANT WBC PRESENT,BOTH PMN AND MONONUCLEAR NO  ORGANISMS SEEN    Culture   Final    FEW Consistent with normal respiratory flora. Performed at [facility], [place] [address], [place], [place] [address]    Report Status 06/22/2019 FINAL  Final     Labs: BNP (last 3 results) No results for input(s): BNP in the last 8760 hours. Basic Metabolic Panel: Recent Labs  Lab 06/20/19 0315  06/21/19 0400 06/22/19 0425 06/23/19 0343 06/24/19 0221 06/25/19 0705  NA 150*   < > 151* 149* 146* 141 137  K 3.3*   < > 3.4* 3.6 3.3* 3.3* 3.8  CL 106  --  107 106 105 101 99  CO2 33*  --  32 32 32 29 24  GLUCOSE 104*  --  104* 104* 99 88 95  BUN 39*  --  42* 46* 43* 35* 31*  CREATININE 1.10  --  0.91 1.13 1.07 0.98 1.04  CALCIUM 9.3  --  9.3 9.5 9.6 9.2 9.1  MG 2.6*  --  2.6* 2.8* 2.7* 2.2 2.3  PHOS 3.3  --  4.8* 4.9* 3.8 3.6  --    < > = values in this interval not displayed.   Liver Function Tests: No results for input(s): AST, ALT, ALKPHOS, BILITOT, PROT, ALBUMIN in the last 168 hours. No results for input(s): LIPASE, AMYLASE in the last 168 hours. No results for input(s): AMMONIA in the last 168 hours. CBC: Recent Labs  Lab 06/21/19 0400 06/22/19 0425 06/23/19 0343 06/24/19 0221 06/25/19 0705  WBC 7.8 8.1 7.8 9.6 9.6  HGB 12.1* 13.0 13.3 13.7 14.6  HCT 39.7 42.1 43.3 42.3 43.5  MCV 94.1 94.2 93.3 89.4 86.5  PLT 258 274 305 292 300   Cardiac Enzymes: No results for input(s): CKTOTAL, CKMB, CKMBINDEX, TROPONINI in  the last 168 hours. BNP: Invalid input(s): POCBNP CBG: Recent Labs  Lab 06/24/19 1954 06/25/19 0013 06/25/19 0500 06/25/19 0804 06/25/19 1229  GLUCAP 136* 91 104* 146* 93   D-Dimer No results for input(s): DDIMER in the last 72 hours. Hgb A1c No results for input(s): HGBA1C in the last 72 hours. Lipid Profile No results for input(s): CHOL, HDL, LDLCALC, TRIG, CHOLHDL, LDLDIRECT in the last 72 hours. Thyroid function studies No results for input(s): TSH, T4TOTAL, T3FREE, THYROIDAB in the last 72  hours.  Invalid input(s): FREET3 Anemia work up No results for input(s): VITAMINB12, FOLATE, FERRITIN, TIBC, IRON, RETICCTPCT in the last 72 hours. Urinalysis No results found for: COLORURINE, APPEARANCEUR, [place], [place], [place], [place], [place], [place], PROTEINUR, UROBILINOGEN, NITRITE, LEUKOCYTESUR Sepsis Labs Invalid input(s): PROCALCITONIN,  WBC,  LACTICIDVEN Microbiology Recent Results (from the past 240 hour(s))  Culture, respiratory (non-expectorated)     Status: None   Collection Time: 06/20/19  9:11 AM   Specimen: Tracheal Aspirate; Respiratory  Result Value Ref Range Status   Specimen Description TRACHEAL ASPIRATE  Final   Special Requests NONE  Final   Gram Stain   Final    ABUNDANT WBC PRESENT,BOTH PMN AND MONONUCLEAR NO ORGANISMS SEEN    Culture   Final    FEW Consistent with normal respiratory flora. Performed at [facility], [place] [address] [place], [place] [address]    Report Status 06/22/2019 FINAL  Final     Time coordinating discharge: 45 minutes  SIGNED:   Tawni Millers, MD  [facility] 06/25/2019, 3:50 PM

## 2019-06-25 NOTE — Evaluation (Signed)
Physical Therapy Evaluation Patient Details Name: Lawrence Medina MRN: [id number] DOB: [birth year] Today's Date: 06/25/2019   History of Present Illness  Patient suffered cardiac arrest on 06/14/2019. He had CPR from his family. He was intubated for several days. He reports he has been up and walking down the hall. His chief complaint is pain in his ribs.   Clinical Impression  Patient appears to be at baseline mobility. He is independent with transfers and ambulation. He continues to have some rib pain but it does not effect his mobility. He has no need for skilled therapy.     Follow Up Recommendations No PT follow up    Equipment Recommendations  None recommended by PT    Recommendations for Other Services       Precautions / Restrictions Precautions Precautions: None Restrictions Weight Bearing Restrictions: Yes RUE Weight Bearing: Partial weight bearing RUE Partial Weight Bearing Percentage or Pounds: 5 pounds      Mobility  Bed Mobility Overal bed mobility: Independent             General bed mobility comments: patient dfound in chair but reports he has not been out of bed.   Transfers Overall transfer level: Independent               General transfer comment: transfered from sit to stand without use of a device   Ambulation/Gait Ambulation/Gait assistance: Independent Gait Distance (Feet): 100 Feet Assistive device: None   Gait velocity: decreased    General Gait Details: ambualted without a device. He reported some pain in his ribs when breathing but  otherwise he did well   Stairs            Wheelchair Mobility    Modified Rankin (Stroke Patients Only)       Balance Overall balance assessment: No apparent balance deficits (not formally assessed)                                           Pertinent Vitals/Pain Pain Assessment: Faces Faces Pain Scale: Hurts little more Pain Location: bilateral ribs  Pain Descriptors /  Indicators: Aching    Home Living Family/patient expects to be discharged to:: Private residence Living Arrangements: Spouse/significant other;Children   Type of Home: House Home Access: Stairs to enter   Technical brewer of Steps: 3 Home Layout: One level        Prior Function Level of Independence: Independent         Comments: patient reports no mobility issues prioer to hospitilzation      Hand Dominance        Extremity/Trunk Assessment   Upper Extremity Assessment Upper Extremity Assessment: Overall WFL for tasks assessed    Lower Extremity Assessment Lower Extremity Assessment: Overall WFL for tasks assessed       Communication   Communication: No difficulties  Cognition Arousal/Alertness: Awake/alert Behavior During Therapy: WFL for tasks assessed/performed Overall Cognitive Status: Within Functional Limits for tasks assessed                                        General Comments      Exercises     Assessment/Plan    PT Assessment Patent does not need any further PT services  PT Problem List  PT Treatment Interventions      PT Goals (Current goals can be found in the Care Plan section)  Acute Rehab PT Goals Patient Stated Goal: to go home  PT Goal Formulation: With patient Time For Goal Achievement: 07/09/19 Potential to Achieve Goals: Good    Frequency     Barriers to discharge        Co-evaluation               AM-PAC PT "6 Clicks" Mobility  Outcome Measure Help needed turning from your back to your side while in a flat bed without using bedrails?: None Help needed moving from lying on your back to sitting on the side of a flat bed without using bedrails?: None Help needed moving to and from a bed to a chair (including a wheelchair)?: None Help needed standing up from a chair using your arms (e.g., wheelchair or bedside chair)?: None Help needed to walk in hospital room?: None Help needed  climbing 3-5 steps with a railing? : None 6 Click Score: 24    End of Session   Activity Tolerance: Patient tolerated treatment well Patient left: in chair;with call bell/phone within reach;with nursing/sitter in room   PT Visit Diagnosis: Other abnormalities of gait and mobility (R26.89)    Time: 4356-8616 PT Time Calculation (min) (ACUTE ONLY): 15 min   Charges:   PT Evaluation $PT Eval Moderate Complexity: 1 Mod           Carney Living PT DPT  06/25/2019, 4:03 PM

## 2019-06-25 NOTE — Progress Notes (Signed)
Patient requesting to remove CPAP for a break. Complaining of "his face is on fire".  Mask removed. RN aware.

## 2019-06-25 NOTE — Progress Notes (Signed)
RT Note:  Followed up with patient to placed CPAP.  Patient not ready at this time. States his ribs are hurting and he thinks the pressure from the machine is affecting his breathing and adding to the pain in his ribs.  Machine set up at bedside. Patient aware to call if he would like to wear CPAP. RN aware.  Patient sitting up in chair.

## 2019-06-25 NOTE — TOC Initial Note (Signed)
Transition of Care [facility]) - Initial/Assessment Note    Patient Details  Name: Lawrence Medina MRN: [id number] Date of Birth: 13-Jan-1966  Transition of Care [facility]) CM/SW Contact:    Bethena Roys, RN Phone Number: 06/25/2019, 3:36 PM  Clinical Narrative: Pt is a transfer from [facility]- PEA arrest hx of cocaine abuse-resources provided. PTA from home with parents. Will need a CPAP once ready to transition home. CM did speak about different agencies to send referral to and patient wants to purchase CPAP on-line. CM did make MD aware and he will write Rx and provide to patient. Patient states he had sleep study via [place]- He sees Dr. Annamaria Boots @ [facility].  CM could never see any results in Epic regarding sleep study. CM did call [facility] and no records. CM did call [facility] as well-awaiting return call back.               Expected Discharge Plan: Home/Self Care Barriers to Discharge: No Barriers Identified   Patient Goals and CMS Choice Patient states their goals for this hospitalization and ongoing recovery are:: "to go home"   Choice offered to / list presented to : NA  Expected Discharge Plan and Services Expected Discharge Plan: Home/Self Care In-house Referral: NA Discharge Planning Services: CM Consult Post Acute Care Choice: NA Living arrangements for the past 2 months: Mobile Home                 DME Arranged: (Pt wants to buy his own CPAP- just wants order for DME.)                    Prior Living Arrangements/Services Living arrangements for the past 2 months: Mobile Home Lives with:: Parents Patient language and need for interpreter reviewed:: Yes Do you feel safe going back to the place where you live?: Yes      Need for Family Participation in Patient Care: Yes (Comment) Care giver support system in place?: Yes (comment)   Criminal Activity/Legal Involvement Pertinent to Current Situation/Hospitalization: No - Comment as  needed  Activities of Daily Living Home Assistive Devices/Equipment: None ADL Screening (condition at time of admission) Patient's cognitive ability adequate to safely complete daily activities?: No Is the patient deaf or have difficulty hearing?: No Does the patient have difficulty seeing, even when wearing glasses/contacts?: No Does the patient have difficulty concentrating, remembering, or making decisions?: Yes Patient able to express need for assistance with ADLs?: No Does the patient have difficulty dressing or bathing?: Yes Independently performs ADLs?: No Communication: Needs assistance Is this a change from baseline?: Change from baseline, expected to last <3 days Dressing (OT): Dependent Is this a change from baseline?: Change from baseline, expected to last <3days Grooming: Dependent Is this a change from baseline?: Change from baseline, expected to last <3 days Feeding: Dependent Is this a change from baseline?: Change from baseline, expected to last <3 days Bathing: Dependent Is this a change from baseline?: Change from baseline, expected to last <3 days Toileting: Dependent Is this a change from baseline?: Change from baseline, expected to last <3 days In/Out Bed: Dependent Is this a change from baseline?: Change from baseline, expected to last <3 days Walks in Home: Dependent Is this a change from baseline?: Change from baseline, expected to last <3 days Does the patient have difficulty walking or climbing stairs?: Yes Weakness of Legs: None Weakness of Arms/Hands: None  Permission Sought/Granted  Emotional Assessment Appearance:: Appears stated age Attitude/Demeanor/Rapport: Engaged Affect (typically observed): Accepting Orientation: : Oriented to Self, Oriented to Situation, Oriented to Place, Oriented to  Time Alcohol / Substance Use: Illicit Drugs(resources provided.) Psych Involvement: No (comment)  Admission diagnosis:  post  CPR Patient Active Problem List   Diagnosis Date Noted  . Acute respiratory failure with hypoxia ([place])   . Cardiac arrest ([place]) 06/14/2019   PCP:  Patient, No Pcp Per Pharmacy:   [place], [address] [place] [address] Phone: [phone] Fax: [phone]     Social Determinants of Health (SDOH) Interventions    Readmission Risk Interventions No flowsheet data found.

## 2019-06-25 NOTE — Care Management Important Message (Signed)
Important Message  Patient Details  Name: Lawrence Medina MRN: [id number] Date of Birth: 17-Dec-1965   Medicare Important Message Given:  Yes     Shelda Altes 06/25/2019, 2:17 PM

## 2019-06-25 NOTE — Telephone Encounter (Signed)
Called and spoke with Lawrence Medina letting her know that pt did have NPSG performed 6/27 and that she should be able to see results under procedures tab of pt's chart. Lawrence Medina stated she would look at the results tomorrow as pt is leaving the hospital today but did not want to go through an agency to get a CPAP as he wanted to purchase one online. Nothing further needed.

## 2019-06-25 NOTE — Progress Notes (Signed)
Pt called this unit and asked if he had forgotten his paper about his C-pap machine he intended to purchase.  The room was already clean and did not see any paper left.  Camera operator spoke with Hassan Rowan, [place].  Since he declined to go through an agency and wanted to order online, we are unable to help, so he would need to go through his pulmonologist.   Relayed message to his mother. Mother will tell him.  Idolina Primer, RN

## 2019-06-26 ENCOUNTER — Encounter: Payer: Self-pay | Admitting: Cardiology

## 2019-06-26 ENCOUNTER — Telehealth: Payer: Self-pay | Admitting: Internal Medicine

## 2019-06-26 ENCOUNTER — Telehealth: Payer: Self-pay | Admitting: *Deleted

## 2019-06-26 DIAGNOSIS — G4733 Obstructive sleep apnea (adult) (pediatric): Secondary | ICD-10-CM

## 2019-06-26 NOTE — Telephone Encounter (Signed)
Dr. Annamaria Boots pt requesting another rx for cpap. I don't see where the 1st rx was given. On yesterday case manager placed a call [id number] performed on 6/27 and stated pt wanted to purchase equipment online and not thru DME.   RECOMMENDATIONS  - Suggest CPAP titration sleep study or autopap. Consider Sleep  medical referral.  - Supplemental oxygen and specific treatment for RBD should be  considered, if not resolved by CPAP.  - Be careful with alcohol, sedatives and other CNS depressants  that Stephanie worsen sleep apnea and disrupt normal sleep  architecture.  - Sleep hygiene should be reviewed to assess factors that [place]  improve sleep quality.  - Weight management and regular exercise should be initiated or  continued if appropriate.

## 2019-06-26 NOTE — Telephone Encounter (Signed)
He had very severe obstructive sleep apnea, averaging over 90 apnea/ hour, with very low oxygen scores.  Recommend CPAP auto 10-20, mask of choice, humidifier, supplies, Airview/ download card. Ok to print a script for him to use ratherr than going through a DME company if that is what he wants to do.   Please verify that he has a f/u appt scheduled - we need to make sure his oxygen scores are improved

## 2019-06-26 NOTE — Telephone Encounter (Signed)
Pt is returning call. Cb is [phone].

## 2019-06-26 NOTE — Telephone Encounter (Signed)
Patient was discharged from [facility] yesterday, 06/25/2019 and is having increased swelling in his left leg. He denies any pain, redness, or heat to the extremity. He states, "I can feel the pressure in my leg from the swelling." Patient has not checked his vital signs today. He does have some chest pain from having CPR performed on him in the hospital, no shortness of breath. He weighed 327 pounds today. He was instructed to only take the following medications upon discharge: tylenol 650 mg q6h PRN, doxepin 75 mg qd HS PRN, lisinopril 40 mg daily, lovastatin 20 mg qd, nitroglycerin PRN. Will have Dr. Bettina Gavia advise.

## 2019-06-26 NOTE — Telephone Encounter (Signed)
Called & spoke w/ pt regarding CY's recommendations. Pt verbalized understanding and stated he would like to obtain a print prescription for CPAP this afternoon. I let him know we will be placing this signed prescription in the foyer for pick-up. Pt verbalized understanding.   I have also made a 31-90 day f/u appt with pt for CPAP compliance follow-up. Pt agreed to scheduling appt for 09/26/2019 at 9:30 AM w/ TP.   Prescription has been placed in the foyer for pt pick-up. Appt has been scheduled. Nothing further needed at this time.

## 2019-06-27 ENCOUNTER — Telehealth: Payer: Self-pay | Admitting: Cardiology

## 2019-06-27 NOTE — Telephone Encounter (Signed)
He has upcoming office visit. Hesitant to change therapies without seeing him. If he would like to try to move up his appointment from next Friday he [place].

## 2019-06-27 NOTE — Telephone Encounter (Signed)
Patient advised that Dr Bettina Gavia is hesitant to make medication changes over the phone.  Patient was offered to move his appointment up from 7-24 to another day, but he refused.  He states that he had to go to fire department this morning for the pain he is experiencing in his chest.  He feels certain the pain is from his broken ribs.  Fire department advised him to go to ED, but patient refused.  Patient requested pain medication from Dr Bettina Gavia.  Patient was advised that he should obtain pain medication from PCP or be evaluated by Urgent Care or ED for pain medications.  Patient agreed to plan and verbalized understanding.  No further questions.

## 2019-06-27 NOTE — Telephone Encounter (Signed)
Patient returned call to [facility]. Please call patient ack.

## 2019-06-27 NOTE — Telephone Encounter (Signed)
See most recent encounter.  Patient offered earlier appointment.  Patient refused.

## 2019-07-04 NOTE — Progress Notes (Signed)
Cardiology Office Note:    Date:  07/05/2019   ID:  Lawrence Medina, DOB 07-01-1966, MRN [id number]  PCP:  Patient, No Pcp Per  Cardiologist:  Shirlee More, MD    Referring MD: Leonard Downing, *    ASSESSMENT:    1. Hypertensive heart disease with heart failure ([place])   2. Obstructive sleep apnea syndrome   3. Chronic obstructive pulmonary disease, unspecified COPD type ([place])    PLAN:    In order of problems listed above:  1. 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 2. Unfortunately poorly treated he has yet to receive a home device 3. Managed by the pulmonary service  Is a very complex visit with a large volume of information to be reviewed prior to and during the visit as he was not seen by cardiology.  If he had a VF arrest he will quickly need referral to EP for an ICD if not he needs intensive therapy for his pulmonary disease and I spent time in the office 25 minutes with the patient reviewing the results of this hospitalization and records.  We had a shared decision making about intensifying his diuretics until he receives his CPAP machine and I reinforced with him the urgency of obtaining the device in view of his outpatient respiratory arrest  Next appointment: 1 month   Medication Adjustments/Labs and Tests Ordered: Current medicines are reviewed at length with the patient today.  Concerns regarding medicines are outlined above.  No orders of the defined types were placed in this encounter.  No orders of the defined types were placed in this encounter.   Chief Complaint  Patient presents with  . Follow-up    after respiratory arrest    History of  Present Illness:    Lawrence Medina is a 53 y.o. male with a hx of hypertension, hyperlipidemia, morbid obesity, chest pain and SOB with apparent heart failure and started on a diuretic.  His proBNP level was quite low 20 chest x-ray showed chronic peribronchial thickening consistent with primary lung disease and echocardiogram showed normal systolic function with moderate concentric left ventricular hypertrophy.  He is subsequently seen by pulmonary Dr. Baird Lyons was felt to have probable severe obstructive sleep apnea and COPD who is begun on bronchodilators and counseled on smoking cessation  He was last seen 05/24/2019.  He had an asystolic arrest with bilateral pneumonia.  He does have significant past medical history for hypertension, and obstructive sleep apnea.  Apparently he was riding in the car with his family when suddenly complained of feeling ill, became unresponsive and his lost his pulse.  He had immediate CPR by his family and then EMS.  He had advanced airway placed, and he recovered spontaneous circulation after 4 rounds of CPR.  Patient required epinephrine and norepinephrine drips.  His blood pressure was 117/78, heart rate 95, temperature 97.4, respiratory rate 20, oxygen saturation 97% 100% FiO2/PEEP of 10 mechanical ventilation.  His lungs were clear to auscultation, heart S1-S2 present, distant, abdomen protuberant, positive bilateral extremity edema.   Compliance with diet, lifestyle and medications: Not fully unfortunately he does not weigh daily  His weight is up  in excess of 10 pounds he is more edematous after hospitalization IV fluids.  He is very upset because he is yet to receive his CPAP machine he is called his pulmonary physicians and he was called advanced home care on several occasions.  He remains short of breath with activities he has profound edema no chest pain or syncope.  Clinically I think he has fluid overload and is pseudo-heart failure related to severe  obstructive sleep apnea.  I reviewed his records from the hospital with him and they describe him having a asystolic respiratory arrest there is no notation that he had defibrillation and was not seen by cardiology.  The patient is insistent with me that he spoke to 1 of the people who did his resuscitation and he states that he was defibrillated.  This makes a world of difference and we have reached out to the [place] fire department and asked him for a copy of the records.  He has no evidence of cardiomyopathy had no arrhythmia in the hospital he did not have acute myocardial infarction I think it is very unlikely that this was a primary cardiology event and that he would benefit from an ICD. Past Medical History:  Diagnosis Date  . Allergic rhinitis   . Arthritis   . Asthma   . Complication of anesthesia   . DDD (degenerative disc disease), lumbar   . GERD (gastroesophageal reflux disease)   . Headache   . High cholesterol   . Hypertension   . PONV (postoperative nausea and vomiting)   . Seizures ([place]) 1982   x1 in 1982- unknown cause- none since  . Shortness of breath dyspnea    with exertion    Past Surgical History:  Procedure Laterality Date  . HERNIA REPAIR    . SHOULDER ARTHROSCOPY WITH ROTATOR CUFF REPAIR Left 02/23/2015   Procedure: SHOULDER ARTHROSCOPY WITH ROTATOR CUFF REPAIR BIOTENDONESIS REPAIR;  Surgeon: Marybelle Killings, MD;  Location: [facility];  Service: Orthopedics;  Laterality: Left;  . SUBMANDIBULAR GLAND EXCISION Left 1975  . TOTAL KNEE ARTHROPLASTY Left 1986    Current Medications: Current Meds  Medication Sig  . albuterol (VENTOLIN HFA) 108 (90 Base) MCG/ACT inhaler Inhale 2 puffs into the lungs every 4 (four) hours as needed.   . doxepin (SINEQUAN) 25 MG capsule Take 75 mg by mouth at bedtime as needed (*Hayter gradually increase to a max total of 12 capsules (300mg ) in every 24 hours).  . fluticasone (FLONASE) 50 MCG/ACT nasal spray Place 2 sprays into both nostrils  at bedtime.   . furosemide (LASIX) 20 MG tablet Take 1 tablet (20 mg total) by mouth 2 (two) times daily.  Marland Kitchen HYDROcodone-acetaminophen (NORCO/VICODIN) 5-325 MG tablet Take 1-2 tablets by mouth every 4 (four) hours as needed.  Marland Kitchen lisinopril (ZESTRIL) 40 MG tablet Take 1 tablet by mouth daily.  Marland Kitchen loratadine (CLARITIN) 10 MG tablet Take 10 mg by mouth daily.  Marland Kitchen lovastatin (MEVACOR) 20 MG tablet Take 20 mg by mouth daily.  . nitroGLYCERIN (NITROSTAT) 0.4 MG SL tablet Place 1 tablet (0.4 mg total) under the tongue every 5 (five) minutes as needed for chest pain.  Marland Kitchen omeprazole (PRILOSEC) 40 MG capsule Take 40 mg by mouth daily.  . sildenafil (VIAGRA) 100 MG tablet Take 100 mg by mouth as needed for erectile dysfunction.     Allergies:   Patient has no known allergies.   Social History   Socioeconomic History  . Marital status: Married  Spouse name: Not on file  . Number of children: Not on file  . Years of education: Not on file  . Highest education level: Not on file  Occupational History  . Not on file  Social Needs  . Financial resource strain: Not on file  . Food insecurity    Worry: Not on file    Inability: Not on file  . Transportation needs    Medical: Not on file    Non-medical: Not on file  Tobacco Use  . Smoking status: Current Every Day Smoker    Packs/day: 0.25    Years: 15.00    Pack years: 3.75    Types: Cigarettes  . Smokeless tobacco: Never Used  Substance and Sexual Activity  . Alcohol use: Yes    Comment: very seldom  . Drug use: Not Currently    Types: Marijuana  . Sexual activity: Not on file  Lifestyle  . Physical activity    Days per week: Not on file    Minutes per session: Not on file  . Stress: Not on file  Relationships  . Social Herbalist on phone: Not on file    Gets together: Not on file    Attends religious service: Not on file    Active member of club or organization: Not on file    Attends meetings of clubs or  organizations: Not on file    Relationship status: Not on file  Other Topics Concern  . Not on file  Social History Narrative   ** Merged History Encounter **         Family History: The patient's family history includes Cancer in his father and mother; Hypertension in his mother; Sleep apnea in his mother. ROS:   Please see the history of present illness.    All other systems reviewed and are negative.  EKGs/Labs/Other Studies Reviewed:    The following studies were reviewed today:    Echo 06/15/2019:   1. The left ventricle has normal systolic function with an ejection fraction of 60-65%. The cavity size was normal. There is moderate concentric left ventricular hypertrophy. Left ventricular diastolic Doppler parameters are consistent with impaired  relaxation.  2. The right ventricle has normal systolic function. The cavity was normal. There is no increase in right ventricular wall thickness.  3. The tricuspid valve is grossly normal.  4. The aortic valve is tricuspid. Aortic valve regurgitation was not assessed by color flow Doppler.  Recent Labs: 05/24/2019: NT-Pro BNP 30 06/14/2019: ALT 65 06/25/2019: BUN 31; Creatinine, Ser 1.04; Hemoglobin 14.6; Magnesium 2.3; Platelets 300; Potassium 3.8; Sodium 137  Recent Lipid Panel    Component Value Date/Time   CHOL 207 (H) 06/14/2019 1926   TRIG 147 06/16/2019 0931   HDL 35 (L) 06/14/2019 1926   CHOLHDL 5.9 06/14/2019 1926   VLDL 29 06/14/2019 1926   LDLCALC 143 (H) 06/14/2019 1926    Physical Exam:    VS:  BP 136/86 (BP Location: Right Arm, Patient Position: Sitting, Cuff Size: Large)   Pulse 93   Temp (!) 96.7 F (35.9 C)   Ht 6' (1.829 m)   Wt (!) 344 lb 12.8 oz (156.4 kg)   SpO2 96%   BMI 46.76 kg/m     Wt Readings from Last 3 Encounters:  07/05/19 (!) 344 lb 12.8 oz (156.4 kg)  06/25/19 (!) 327 lb 13.2 oz (148.7 kg)  06/08/19 (!) 350 lb (158.8 kg)     GEN: Markedly  obese but awake and alert well nourished,  well developed in no acute distress HEENT: Normal NECK: No JVD; No carotid bruits LYMPHATICS: No lymphadenopathy CARDIAC: RRR, no murmurs, rubs, gallops RESPIRATORY:  Clear to auscultation without rales, wheezing or rhonchi  ABDOMEN: Soft, non-tender, non-distended MUSCULOSKELETAL: He has tense 4+ lower extremity edema ankles to knees edema; No deformity  SKIN: Warm and dry NEUROLOGIC:  Alert and oriented x 3 PSYCHIATRIC:  Normal affect    Signed, Shirlee More, MD  07/05/2019 8:52 AM    [place]

## 2019-07-05 ENCOUNTER — Ambulatory Visit (INDEPENDENT_AMBULATORY_CARE_PROVIDER_SITE_OTHER): Payer: Medicare Other | Admitting: Cardiology

## 2019-07-05 ENCOUNTER — Other Ambulatory Visit: Payer: Self-pay

## 2019-07-05 ENCOUNTER — Encounter: Payer: Self-pay | Admitting: Cardiology

## 2019-07-05 VITALS — BP 136/86 | HR 93 | Temp 96.7°F | Ht 72.0 in | Wt 344.8 lb

## 2019-07-05 DIAGNOSIS — I110 Hypertensive heart disease with heart failure: Secondary | ICD-10-CM | POA: Diagnosis not present

## 2019-07-05 DIAGNOSIS — J449 Chronic obstructive pulmonary disease, unspecified: Secondary | ICD-10-CM

## 2019-07-05 DIAGNOSIS — G4733 Obstructive sleep apnea (adult) (pediatric): Secondary | ICD-10-CM

## 2019-07-05 MED ORDER — TORSEMIDE 20 MG PO TABS: 20.0000 mg | ORAL_TABLET | Freq: Two times a day (BID) | ORAL | 3 refills | Status: DC

## 2019-07-05 NOTE — Patient Instructions (Addendum)
Medication Instructions:  Your physician has recommended you make the following change in your medication:   STOP: Furosemide  START: Torsemide 20 mg: Take 1 tab twice daily  If you need a refill on your cardiac medications before your next appointment, please call your pharmacy.   Lab work: Your physician recommends that you return for lab work in: TODAY BMP,Pro BNP  If you have labs (blood work) drawn today and your tests are completely normal, you will receive your results only by: Marland Kitchen MyChart Message (if you have MyChart) OR . A paper copy in the mail If you have any lab test that is abnormal or we need to change your treatment, we will call you to review the results.  Testing/Procedures: None  Follow-Up: At [facility], you and your health needs are our priority.  As part of our continuing mission to provide you with exceptional heart care, we have created designated Provider Care Teams.  These Care Teams include your primary Cardiologist (physician) and Advanced Practice Providers (APPs -  Physician Assistants and Nurse Practitioners) who all work together to provide you with the care you need, when you need it. You will need a follow up appointment in 1 months.  Any Other Special Instructions Will Be Listed Below (If Applicable).  WEIGH DAILY AND RECORD Daily Weight Record It is important to weigh yourself daily. To do this:  Make sure you use a reliable scale. Use the same scale each day.  Keep this daily weight chart near your scale.  Weigh yourself each morning at the same time.  Before weighing yourself: ? Take off your shoes. ? Make sure you are wearing the same amount of clothing each day.  Write down your weight in the spaces on the form.  Compare today's weight to yesterday's weight.  Bring this form with you to your follow-up visits with your health care provider. Call your health care provider if you have concerns about your weight, including rapid  weight gain or loss. Date: ________ Weight: ____________________ Date: ________ Weight: ____________________ Date: ________ Weight: ____________________ Date: ________ Weight: ____________________ Date: ________ Weight: ____________________ Date: ________ Weight: ____________________ Date: ________ Weight: ____________________ Date: ________ Weight: ____________________ Date: ________ Weight: ____________________ Date: ________ Weight: ____________________ Date: ________ Weight: ____________________ Date: ________ Weight: ____________________ Date: ________ Weight: ____________________ Date: ________ Weight: ____________________ Date: ________ Weight: ____________________ Date: ________ Weight: ____________________ Date: ________ Weight: ____________________ Date: ________ Weight: ____________________ Date: ________ Weight: ____________________ Date: ________ Weight: ____________________ Date: ________ Weight: ____________________ Date: ________ Weight: ____________________ Date: ________ Weight: ____________________ Date: ________ Weight: ____________________ Date: ________ Weight: ____________________ Date: ________ Weight: ____________________ Date: ________ Weight: ____________________ Date: ________ Weight: ____________________ Date: ________ Weight: ____________________ Date: ________ Weight: ____________________ Date: ________ Weight: ____________________ Date: ________ Weight: ____________________ Date: ________ Weight: ____________________ Date: ________ Weight: ____________________ Date: ________ Weight: ____________________ Date: ________ Weight: ____________________ Date: ________ Weight: ____________________ Date: ________ Weight: ____________________ Date: ________ Weight: ____________________ Date: ________ Weight: ____________________ Date: ________ Weight: ____________________ Date: ________ Weight: ____________________ Date: ________ Weight: ____________________ Date: ________  Weight: ____________________ Date: ________ Weight: ____________________ Date: ________ Weight: ____________________ Date: ________ Weight: ____________________ Date: ________ Weight: ____________________ Date: ________ Weight: ____________________ Date: ________ Weight: ____________________ This information is not intended to replace advice given to you by your health care provider. Make sure you discuss any questions you have with your health care provider. Document Released: 02/09/2007 Document Revised: 11/27/2017 Document Reviewed: 11/27/2017 Elsevier Patient Education  2020 [employer].

## 2019-08-21 ENCOUNTER — Ambulatory Visit: Payer: Medicare Other | Admitting: Cardiology

## 2019-09-09 NOTE — Progress Notes (Signed)
Cardiology Office Note:    Date:  09/10/2019   ID:  Lawrence Medina, DOB [birth year], MRN [id number]  PCP:  Patient, No Pcp Per  Cardiologist:  Shirlee More, MD    Referring MD: No ref. provider found    ASSESSMENT:    1. Hypertensive heart disease with heart failure ([place])   2. Obstructive sleep apnea syndrome   3. Chronic obstructive pulmonary disease, unspecified COPD type ([place])    PLAN:    In order of problems listed above:  1. Improved minimal fluid overload continue his current diuretic antihypertensives and he tells me had a lab work done this week I requested a copy with normal renal function and potassium. 2. I am gravely concerned by his noncompliance with CPAP at risk of recurrent respiratory arrest and strongly encouraged him to rapidly pursue having his CPAP machine fixed and restarting treatment 3. Proved continue his bronchodilator   Next appointment: 3 months   Medication Adjustments/Labs and Tests Ordered: Current medicines are reviewed at length with the patient today.  Concerns regarding medicines are outlined above.  No orders of the defined types were placed in this encounter.  No orders of the defined types were placed in this encounter.   Chief Complaint  Patient presents with  . Follow-up  . Congestive Heart Failure    History of Present Illness:    Lawrence Medina is a 54 y.o. male with a hx of hypertension hypertensive heart disease with left ventricular hypertrophy and heart failure as well as severe obstructive sleep apnea and COPD with an asystolic arrest in the setting of bilateral pneumonia.  He was last seen 06/27/2019.  Sleep test showed what was described as very severe obstructive sleep apnea ranging over 90 apneic episodes per hour very low oxygen scores and was prescribed CPAP.Marland Kitchen Compliance with diet, lifestyle and medications: Yes but he no longer weighs daily  Is improved weights down about 15 pounds less edema less short of breath  but he is short of breath when he walks outdoors no orthopnea syncope chest pain.  Unfortunately his CPAP is broken he has not used it for 2 weeks and strongly encouraged him to fix the problem. Past Medical History:  Diagnosis Date  . Allergic rhinitis   . Arthritis   . Asthma   . Complication of anesthesia   . DDD (degenerative disc disease), lumbar   . GERD (gastroesophageal reflux disease)   . Headache   . High cholesterol   . Hypertension   . PONV (postoperative nausea and vomiting)   . Seizures ([place]) 1982   x1 in 1982- unknown cause- none since  . Shortness of breath dyspnea    with exertion    Past Surgical History:  Procedure Laterality Date  . HERNIA REPAIR    . SHOULDER ARTHROSCOPY WITH ROTATOR CUFF REPAIR Left 02/23/2015   Procedure: SHOULDER ARTHROSCOPY WITH ROTATOR CUFF REPAIR BIOTENDONESIS REPAIR;  Surgeon: Marybelle Killings, MD;  Location: [facility];  Service: Orthopedics;  Laterality: Left;  . SUBMANDIBULAR GLAND EXCISION Left 1975  . TOTAL KNEE ARTHROPLASTY Left 1986    Current Medications: Current Meds  Medication Sig  . acetaminophen (TYLENOL 8 HOUR ARTHRITIS PAIN) 650 MG CR tablet Take 1,300 mg by mouth at bedtime.  Marland Kitchen albuterol (VENTOLIN HFA) 108 (90 Base) MCG/ACT inhaler Inhale 1 puff into the lungs every 6 (six) hours as needed.  . budesonide-formoterol (SYMBICORT) 160-4.5 MCG/ACT inhaler Inhale 1 puff into the lungs 2 (two) times daily.  Marland Kitchen  hydrOXYzine (ATARAX/VISTARIL) 25 MG tablet Take 1 to 2 tablets every 8 hours as needed for allergies or sleep  . lisinopril (ZESTRIL) 40 MG tablet Take 1 tablet by mouth daily.  Marland Kitchen loratadine (CLARITIN) 10 MG tablet Take 10 mg by mouth daily.  Marland Kitchen lovastatin (MEVACOR) 20 MG tablet Take 20 mg by mouth daily.  . nitroGLYCERIN (NITROSTAT) 0.4 MG SL tablet Place 1 tablet (0.4 mg total) under the tongue every 5 (five) minutes as needed for chest pain.  Marland Kitchen omeprazole (PRILOSEC) 40 MG capsule Take 40 mg by mouth daily.  . sildenafil  (VIAGRA) 100 MG tablet Take 100 mg by mouth as needed for erectile dysfunction.  . torsemide (DEMADEX) 20 MG tablet Take 1 tablet (20 mg total) by mouth 2 (two) times daily.     Allergies:   Codeine   Social History   Socioeconomic History  . Marital status: Married    Spouse name: Not on file  . Number of children: Not on file  . Years of education: Not on file  . Highest education level: Not on file  Occupational History  . Not on file  Social Needs  . Financial resource strain: Not on file  . Food insecurity    Worry: Not on file    Inability: Not on file  . Transportation needs    Medical: Not on file    Non-medical: Not on file  Tobacco Use  . Smoking status: Current Every Day Smoker    Packs/day: 0.25    Years: 15.00    Pack years: 3.75    Types: Cigarettes  . Smokeless tobacco: Never Used  Substance and Sexual Activity  . Alcohol use: Yes    Alcohol/week: 2.0 standard drinks    Types: 2 Cans of beer per week    Comment: 2 beers per week   . Drug use: Yes    Types: Marijuana  . Sexual activity: Not on file  Lifestyle  . Physical activity    Days per week: Not on file    Minutes per session: Not on file  . Stress: Not on file  Relationships  . Social Herbalist on phone: Not on file    Gets together: Not on file    Attends religious service: Not on file    Active member of club or organization: Not on file    Attends meetings of clubs or organizations: Not on file    Relationship status: Not on file  Other Topics Concern  . Not on file  Social History Narrative   ** Merged History Encounter **         Family History: The patient's family history includes Cancer in his father and mother; Hypertension in his mother; Sleep apnea in his mother. ROS:   Please see the history of present illness.    All other systems reviewed and are negative.  EKGs/Labs/Other Studies Reviewed:    The following studies were reviewed today:   Recent Labs:  05/24/2019: NT-Pro BNP 30 06/14/2019: ALT 65 06/25/2019: BUN 31; Creatinine, Ser 1.04; Hemoglobin 14.6; Magnesium 2.3; Platelets 300; Potassium 3.8; Sodium 137  Recent Lipid Panel    Component Value Date/Time   CHOL 207 (H) 06/14/2019 1926   TRIG 147 06/16/2019 0931   HDL 35 (L) 06/14/2019 1926   CHOLHDL 5.9 06/14/2019 1926   VLDL 29 06/14/2019 1926   LDLCALC 143 (H) 06/14/2019 1926    Physical Exam:    VS:  BP 112/76 (BP  Location: Right Arm, Patient Position: Sitting, Cuff Size: Large)   Pulse 96   Temp (!) 96.8 F (36 C)   Ht 6' (1.829 m)   Wt (!) 330 lb 1.9 oz (149.7 kg)   SpO2 95%   BMI 44.77 kg/m     Wt Readings from Last 3 Encounters:  09/10/19 (!) 330 lb 1.9 oz (149.7 kg)  07/05/19 (!) 344 lb 12.8 oz (156.4 kg)  06/25/19 (!) 327 lb 13.2 oz (148.7 kg)     GEN: He is much more alert and looks healthier no respiratory distress well nourished, well developed in no acute distress HEENT: Normal NECK: No JVD; No carotid bruits LYMPHATICS: No lymphadenopathy CARDIAC: RRR, no murmurs, rubs, gallops RESPIRATORY:  Clear to auscultation without rales, wheezing or rhonchi  ABDOMEN: Soft, non-tender, non-distended MUSCULOSKELETAL: 1+ about the ankle bilateral pitting edema; No deformity  SKIN: Warm and dry NEUROLOGIC:  Alert and oriented x 3 PSYCHIATRIC:  Normal affect    Signed, Shirlee More, MD  09/10/2019 11:52 AM    [place]

## 2019-09-10 ENCOUNTER — Ambulatory Visit (INDEPENDENT_AMBULATORY_CARE_PROVIDER_SITE_OTHER): Payer: Medicare Other | Admitting: Cardiology

## 2019-09-10 ENCOUNTER — Encounter: Payer: Self-pay | Admitting: *Deleted

## 2019-09-10 ENCOUNTER — Encounter: Payer: Self-pay | Admitting: Cardiology

## 2019-09-10 ENCOUNTER — Other Ambulatory Visit: Payer: Self-pay

## 2019-09-10 VITALS — BP 112/76 | HR 96 | Temp 96.8°F | Ht 72.0 in | Wt 330.1 lb

## 2019-09-10 DIAGNOSIS — G4733 Obstructive sleep apnea (adult) (pediatric): Secondary | ICD-10-CM | POA: Diagnosis not present

## 2019-09-10 DIAGNOSIS — I110 Hypertensive heart disease with heart failure: Secondary | ICD-10-CM | POA: Diagnosis not present

## 2019-09-10 DIAGNOSIS — J449 Chronic obstructive pulmonary disease, unspecified: Secondary | ICD-10-CM

## 2019-09-10 NOTE — Patient Instructions (Signed)
Medication Instructions:  Your physician recommends that you continue on your current medications as directed. Please refer to the Current Medication list given to you today.  If you need a refill on your cardiac medications before your next appointment, please call your pharmacy.   Lab work: None  If you have labs (blood work) drawn today and your tests are completely normal, you will receive your results only by: Marland Kitchen MyChart Message (if you have MyChart) OR . A paper copy in the mail If you have any lab test that is abnormal or we need to change your treatment, we will call you to review the results.  Testing/Procedures: None  Follow-Up: At [facility], you and your health needs are our priority.  As part of our continuing mission to provide you with exceptional heart care, we have created designated Provider Care Teams.  These Care Teams include your primary Cardiologist (physician) and Advanced Practice Providers (APPs -  Physician Assistants and Nurse Practitioners) who all work together to provide you with the care you need, when you need it. You will need a follow up appointment in 3 months with Laurann Montana, NP in the [facility] office.

## 2019-09-26 ENCOUNTER — Ambulatory Visit: Payer: Medicare Other | Admitting: Adult Health

## 2019-10-01 ENCOUNTER — Ambulatory Visit (INDEPENDENT_AMBULATORY_CARE_PROVIDER_SITE_OTHER): Payer: Medicare Other

## 2019-10-01 ENCOUNTER — Ambulatory Visit (INDEPENDENT_AMBULATORY_CARE_PROVIDER_SITE_OTHER): Payer: Medicare Other | Admitting: Adult Health

## 2019-10-01 ENCOUNTER — Encounter: Payer: Self-pay | Admitting: Adult Health

## 2019-10-01 ENCOUNTER — Other Ambulatory Visit: Payer: Self-pay

## 2019-10-01 VITALS — BP 136/90 | HR 86 | Temp 97.5°F | Ht 72.0 in | Wt 334.8 lb

## 2019-10-01 DIAGNOSIS — F17200 Nicotine dependence, unspecified, uncomplicated: Secondary | ICD-10-CM

## 2019-10-01 DIAGNOSIS — G4733 Obstructive sleep apnea (adult) (pediatric): Secondary | ICD-10-CM

## 2019-10-01 DIAGNOSIS — Z6841 Body Mass Index (BMI) 40.0 and over, adult: Secondary | ICD-10-CM | POA: Diagnosis not present

## 2019-10-01 DIAGNOSIS — J449 Chronic obstructive pulmonary disease, unspecified: Secondary | ICD-10-CM | POA: Diagnosis not present

## 2019-10-01 DIAGNOSIS — J189 Pneumonia, unspecified organism: Secondary | ICD-10-CM

## 2019-10-01 MED ORDER — BUDESONIDE-FORMOTEROL FUMARATE 160-4.5 MCG/ACT IN AERO: 2.0000 | INHALATION_SPRAY | Freq: Two times a day (BID) | RESPIRATORY_TRACT | 5 refills | Status: DC

## 2019-10-01 NOTE — Assessment & Plan Note (Signed)
Severe obstructive sleep apnea.  Patient is encouraged on CPAP usage and compliance.  Patient education on sleep apnea and potential of untreated sleep apnea complications.  Persad use saline nasal spray and gel as needed  Plan  Patient Instructions  Increase Symbicort 2 puffs Twice daily  , rinse after use.  Wear CPAP each every night for at least 4 hrs or more .  Work on healthy weight  Activity as tolerated.  Work on not smoking (cigarrettes or marajuana , other drugs)  Discuss with Dr. Arelia Sneddon that Lisinopril Creson be aggravating your coughing and wheezing .  Chest xray today .  Follow up with Dr. Annamaria Boots  In 2 months with PFT  Please contact office for sooner follow up if symptoms do not improve or worsen or seek emergency care

## 2019-10-01 NOTE — Assessment & Plan Note (Signed)
Presumed COPD with heavy smoking history.  Needs PFTs.  For now we will continue on Symbicort twice daily. If possible will change ACE inhibitor as patient has ongoing cough and intermittent wheezing.   Plan  Patient Instructions  Increase Symbicort 2 puffs Twice daily  , rinse after use.  Wear CPAP each every night for at least 4 hrs or more .  Work on healthy weight  Activity as tolerated.  Work on not smoking (cigarrettes or marajuana , other drugs)  Discuss with Dr. Arelia Sneddon that Lisinopril Siebenaler be aggravating your coughing and wheezing .  Chest xray today .  Follow up with Dr. Annamaria Boots  In 2 months with PFT  Please contact office for sooner follow up if symptoms do not improve or worsen or seek emergency care

## 2019-10-01 NOTE — Assessment & Plan Note (Signed)
Smoking cessation  

## 2019-10-01 NOTE — Progress Notes (Signed)
Called spoke with patient, advised of cxr results / recs as stated by Parrett NP.  Pt verbalized understanding and denied any questions. 

## 2019-10-01 NOTE — Assessment & Plan Note (Signed)
Bilateral PNA (CAP vs Aspiration ) found after cardiac arrest  Clinically improved  Check chest xray today .

## 2019-10-01 NOTE — Patient Instructions (Addendum)
Increase Symbicort 2 puffs Twice daily  , rinse after use.  Wear CPAP each every night for at least 4 hrs or more .  Work on healthy weight  Activity as tolerated.  Work on not smoking (cigarrettes or marajuana , other drugs)  Discuss with Dr. Arelia Sneddon that Lisinopril Bennett be aggravating your coughing and wheezing .  Chest xray today .  Follow up with Dr. Annamaria Boots  In 2 months with PFT  Please contact office for sooner follow up if symptoms do not improve or worsen or seek emergency care

## 2019-10-01 NOTE — Assessment & Plan Note (Signed)
Weight loss encouraged 

## 2019-10-01 NOTE — Progress Notes (Signed)
@Patient  ID: Lawrence Medina, male    DOB: 04-06-1966, 53 y.o.   MRN: [id number]  Chief Complaint  Patient presents with   Follow-up    COPD     Referring provider: Leonard Downing, *  HPI: 53 year old male seen for pulmonary and sleep consult in June 2020.  Found to have severe obstructive sleep apnea.  Presumed COPD with heavy smoking history  TEST/EVENTS :  Sleep study June 08, 2019 shows severe obstructive sleep apnea with AHI 96/hour severe oxygen desaturation minimum O2 saturation 64%, REM behavior sleep disorder pattern with frequent moving shaking and talking.  Cardiac Arrest 06/2019 -witnessed PEA arrest , s/p cooling , + bilateral PNA on CT chest -CAP vs aspiration PNA , +cocaine, THC , + benzo -klonopin use .   CT chest 06/14/19-Extensive consolidations within the bilateral lower lobes and posterior aspects of the upper lobes, which Donavan be due to multifocal pneumonia.,  Right third through 6 anterior rib fractures.  10/01/2019 Follow up : COPD , OSA , Pneumonia  Patient returns for a follow-up.  Last seen in the office in June 2020.  He was seen at that time for a pulmonary and sleep consult.  Was having daytime sleepiness and restless sleep.  He underwent a sleep study June 08, 2019 that showed severe obstructive sleep apnea with an AHI at 96/hour.  Patient was recommended to begin nocturnal CPAP.  He was also seen for presumed COPD with a long smoking history.  He was started on Anoro.  Set up for pulmonary function testing.  Unfortunately patient had a witnessed cardiac arrest in early July.  He did have bystander CPR with ROSC . he was hospitalized required ventilator support.  CT chest showed bilateral pneumonia concerning for community-acquired pneumonia versus aspiration pneumonia.  Patient did have a positive reported drug use of marijuana and cocaine.  He was also taking Klonopin.  CT angio chest was negative for PE but bilateral consolidation concerning for  pneumonia was noted.  CT head was negative for acute process.  2D echo showed a preserved EF with 60-65%, venous Dopplers were negative for DVT.  Did show a large heterogeneous area in the left popliteal fossa measuring 4.5 x 4.0 cm.  With recommendations to follow-up outpatient wise with primary care provider.  He was treated with aggressive IV antibiotics,  placed on the cooling protocol, and was extubated successfully. Since discharge patient says breathing is doing about the same.  He does get short of breath with minimum activity.  Has intermittent cough.  He has cut back on smoking he is down to 10 cigarettes a day.  He is currently on Symbicort twice daily.  But using 1 puff twice daily.  He has been seen by cardiology since discharge.  Is currently on torsemide.  Says his ankle swelling has been less.  He is down 14 pounds.  Says he has not used any cocaine since discharge.  Has smoked marijuana. Encouraged on cessation. Says he does have occasional cough and wheezing.  No fever, discolored mucus, chest pain, orthopnea.  No syncope or palpitations.. Patient has received his CPAP machine.  Says he is trying to wear it.  But does not like it.  His download shows poor compliance however patient says he has worn it at least every night for the last week but this did not show up on our download.  He is switched mask and is now using nasal pillows which he says is somewhat more comfortable  but seems to draw him out quite a bit.     Allergies  Allergen Reactions   Codeine Itching    Immunization History  Administered Date(s) Administered   Influenza,inj,quad, With Preservative 09/05/2019   Pneumococcal Polysaccharide-23 09/05/2019   Zoster Recombinat (Shingrix) 07/11/2019, 09/05/2019    Past Medical History:  Diagnosis Date   Allergic rhinitis    Arthritis    Asthma    Complication of anesthesia    DDD (degenerative disc disease), lumbar    GERD (gastroesophageal reflux  disease)    Headache    High cholesterol    Hypertension    PONV (postoperative nausea and vomiting)    Seizures ([place]) 1982   x1 in 1982- unknown cause- none since   Shortness of breath dyspnea    with exertion    Tobacco History: Social History   Tobacco Use  Smoking Status Current Every Day Smoker   Packs/day: 0.25   Years: 15.00   Pack years: 3.75   Types: Cigarettes  Smokeless Tobacco Never Used   Ready to quit: No Counseling given: Yes   Outpatient Medications Prior to Visit  Medication Sig Dispense Refill   acetaminophen (TYLENOL 8 HOUR ARTHRITIS PAIN) 650 MG CR tablet Take 1,300 mg by mouth at bedtime.     albuterol (VENTOLIN HFA) 108 (90 Base) MCG/ACT inhaler Inhale 1 puff into the lungs every 6 (six) hours as needed.     hydrOXYzine (ATARAX/VISTARIL) 25 MG tablet Take 1 to 2 tablets every 8 hours as needed for allergies or sleep     lisinopril (ZESTRIL) 40 MG tablet Take 1 tablet by mouth daily.     loratadine (CLARITIN) 10 MG tablet Take 10 mg by mouth daily.     lovastatin (MEVACOR) 20 MG tablet Take 20 mg by mouth daily.     nitroGLYCERIN (NITROSTAT) 0.4 MG SL tablet Place 1 tablet (0.4 mg total) under the tongue every 5 (five) minutes as needed for chest pain. 25 tablet 3   omeprazole (PRILOSEC) 40 MG capsule Take 40 mg by mouth daily as needed.      sildenafil (VIAGRA) 100 MG tablet Take 100 mg by mouth as needed for erectile dysfunction.     torsemide (DEMADEX) 20 MG tablet Take 1 tablet (20 mg total) by mouth 2 (two) times daily. 60 tablet 3   budesonide-formoterol (SYMBICORT) 160-4.5 MCG/ACT inhaler Inhale 1 puff into the lungs 2 (two) times daily.     No facility-administered medications prior to visit.      Review of Systems:   Constitutional:   No  weight loss, night sweats,  Fevers, chills, + fatigue, or  lassitude.  HEENT:   No headaches,  Difficulty swallowing,  Tooth/dental problems, or  Sore throat,                No  sneezing, itching, ear ache,  +nasal congestion, post nasal drip,   CV:  No chest pain,  Orthopnea, PND, +swelling in lower extremities, No  anasarca, dizziness, palpitations, syncope.   GI  No heartburn, indigestion, abdominal pain, nausea, vomiting, diarrhea, change in bowel habits, loss of appetite, bloody stools.   Resp .  No chest wall deformity  Skin: no rash or lesions.  GU: no dysuria, change in color of urine, no urgency or frequency.  No flank pain, no hematuria   MS:  No joint pain or swelling.  No decreased range of motion.  No back pain.    Physical Exam  Pulse 86  Temp (!) 97.5 F (36.4 C)    Ht 6' (1.829 m)    Wt (!) 334 lb 12.8 oz (151.9 kg)    SpO2 97% Comment: on room air   BMI 45.41 kg/m   GEN: A/Ox3; pleasant , NAD, morbidly obese    HEENT:  [place]/AT,    NOSE-clear, THROAT-clear, no lesions, no postnasal drip or exudate noted.  Class 3 MP airway   NECK:  Supple w/ fair ROM; no JVD; normal carotid impulses w/o bruits; no thyromegaly or nodules palpated; no lymphadenopathy.    RESP  Clear  P & A; w/o, wheezes/ rales/ or rhonchi. no accessory muscle use, no dullness to percussion  CARD:  RRR, no m/r/g, tr peripheral edema, pulses intact, no cyanosis or clubbing.  GI:   Soft & nt; nml bowel sounds; no organomegaly or masses detected.   Musco: Warm bil, no deformities or joint swelling noted.   Neuro: alert, no focal deficits noted.    Skin: Warm, no lesions or rashes    Lab Results:  CBC    Component Value Date/Time   WBC 9.6 06/25/2019 0705   RBC 5.03 06/25/2019 0705   HGB 14.6 06/25/2019 0705   HGB 15.5 05/03/2019 1657   HCT 43.5 06/25/2019 0705   HCT 45.3 05/03/2019 1657   PLT 300 06/25/2019 0705   PLT 254 05/03/2019 1657   MCV 86.5 06/25/2019 0705   MCV 86 05/03/2019 1657   MCH 29.0 06/25/2019 0705   MCHC 33.6 06/25/2019 0705   RDW 13.7 06/25/2019 0705   RDW 13.6 05/03/2019 1657   LYMPHSABS 6.0 (H) 06/14/2019 1926   MONOABS 0.1  06/14/2019 1926   EOSABS 0.3 06/14/2019 1926   BASOSABS 0.1 06/14/2019 1926    BMET    Component Value Date/Time   NA 137 06/25/2019 0705   NA 139 05/24/2019 0853   K 3.8 06/25/2019 0705   CL 99 06/25/2019 0705   CO2 24 06/25/2019 0705   GLUCOSE 95 06/25/2019 0705   BUN 31 (H) 06/25/2019 0705   BUN 7 05/24/2019 0853   CREATININE 1.04 06/25/2019 0705   CALCIUM 9.1 06/25/2019 0705   GFRNONAA >60 06/25/2019 0705   GFRAA >60 06/25/2019 0705    BNP No results found for: BNP  ProBNP    Component Value Date/Time   PROBNP 30 05/24/2019 0853    Imaging: Dg Chest 2 View  Result Date: 10/01/2019 CLINICAL DATA:  COPD mixed type. Current smoker. No acute symptoms reported. EXAM: CHEST - 2 VIEW COMPARISON:  05/08/2019 chest radiograph. FINDINGS: Stable cardiomediastinal silhouette with normal heart size. No pneumothorax. No pleural effusion. Borderline mild lung hyperinflation. No pulmonary edema. No acute consolidative airspace disease. IMPRESSION: Borderline mild lung hyperinflation compatible with COPD. No acute cardiopulmonary disease. Electronically Signed   By: Ilona Sorrel M.D.   On: 10/01/2019 10:16      No flowsheet data found.  No results found for: NITRICOXIDE      Assessment & Plan:   OSA (obstructive sleep apnea) Severe obstructive sleep apnea.  Patient is encouraged on CPAP usage and compliance.  Patient education on sleep apnea and potential of untreated sleep apnea complications.  Blatter use saline nasal spray and gel as needed  Plan  Patient Instructions  Increase Symbicort 2 puffs Twice daily  , rinse after use.  Wear CPAP each every night for at least 4 hrs or more .  Work on healthy weight  Activity as tolerated.  Work on not smoking (cigarrettes or  marajuana , other drugs)  Discuss with Dr. Arelia Sneddon that Lisinopril Cones be aggravating your coughing and wheezing .  Chest xray today .  Follow up with Dr. Annamaria Boots  In 2 months with PFT  Please contact office  for sooner follow up if symptoms do not improve or worsen or seek emergency care        COPD (chronic obstructive pulmonary disease) ([address]) Presumed COPD with heavy smoking history.  Needs PFTs.  For now we will continue on Symbicort twice daily. If possible will change ACE inhibitor as patient has ongoing cough and intermittent wheezing.   Plan  Patient Instructions  Increase Symbicort 2 puffs Twice daily  , rinse after use.  Wear CPAP each every night for at least 4 hrs or more .  Work on healthy weight  Activity as tolerated.  Work on not smoking (cigarrettes or marajuana , other drugs)  Discuss with Dr. Arelia Sneddon that Lisinopril Waldrip be aggravating your coughing and wheezing .  Chest xray today .  Follow up with Dr. Annamaria Boots  In 2 months with PFT  Please contact office for sooner follow up if symptoms do not improve or worsen or seek emergency care        Body mass index (BMI) of 45.0-49.9 in adult [facility]) Weight loss encouraged  Current every day smoker Smoking cessation    Pneumonia Bilateral PNA (CAP vs Aspiration ) found after cardiac arrest  Clinically improved  Check chest xray today .      Rexene Edison, NP 10/01/2019

## 2019-12-05 ENCOUNTER — Ambulatory Visit: Payer: Medicare Other | Admitting: Internal Medicine

## 2020-01-21 ENCOUNTER — Other Ambulatory Visit (HOSPITAL_COMMUNITY)
Admission: RE | Admit: 2020-01-21 | Discharge: 2020-01-21 | Disposition: A | Discharge status: Home / Self Care | Payer: Medicare Other | Source: Ambulatory Visit | Attending: Internal Medicine | Admitting: Internal Medicine

## 2020-01-21 DIAGNOSIS — Z20822 Contact with and (suspected) exposure to covid-19: Secondary | ICD-10-CM | POA: Diagnosis not present

## 2020-01-21 DIAGNOSIS — Z01812 Encounter for preprocedural laboratory examination: Secondary | ICD-10-CM | POA: Diagnosis present

## 2020-01-21 LAB — SARS CORONAVIRUS 2 (TAT 6-24 HRS): SARS Coronavirus 2: NEGATIVE

## 2020-01-24 ENCOUNTER — Ambulatory Visit (INDEPENDENT_AMBULATORY_CARE_PROVIDER_SITE_OTHER): Payer: Medicare Other | Admitting: Internal Medicine

## 2020-01-24 ENCOUNTER — Other Ambulatory Visit: Payer: Self-pay

## 2020-01-24 ENCOUNTER — Encounter: Payer: Self-pay | Admitting: Internal Medicine

## 2020-01-24 VITALS — BP 128/70 | HR 89 | Temp 97.0°F | Ht 73.0 in | Wt 354.0 lb

## 2020-01-24 DIAGNOSIS — F17200 Nicotine dependence, unspecified, uncomplicated: Secondary | ICD-10-CM | POA: Diagnosis not present

## 2020-01-24 DIAGNOSIS — J449 Chronic obstructive pulmonary disease, unspecified: Secondary | ICD-10-CM | POA: Diagnosis not present

## 2020-01-24 DIAGNOSIS — G4733 Obstructive sleep apnea (adult) (pediatric): Secondary | ICD-10-CM

## 2020-01-24 LAB — PULMONARY FUNCTION TEST
DL/VA % pred: 123 %
DL/VA: 5.28 ml/min/mmHg/L
DLCO unc % pred: 85 %
DLCO unc: 26.64 ml/min/mmHg
FEF 25-75 Pre: 2.22 L/sec
FEF2575-%Pred-Pre: 63 %
FEV1-%Pred-Pre: 53 %
FEV1-Pre: 2.21 L
FEV1FVC-%Pred-Pre: 109 %
FEV6-%Pred-Pre: 50 %
FEV6-Pre: 2.64 L
FEV6FVC-%Pred-Pre: 104 %
FVC-%Pred-Pre: 48 %
FVC-Pre: 2.64 L
Pre FEV1/FVC ratio: 84 %
Pre FEV6/FVC Ratio: 100 %
RV % pred: 175 %
RV: 4.05 L
TLC % pred: 93 %
TLC: 7.07 L

## 2020-01-24 MED ORDER — ALBUTEROL SULFATE HFA 108 (90 BASE) MCG/ACT IN AERS: INHALATION_SPRAY | RESPIRATORY_TRACT | 12 refills | Status: DC

## 2020-01-24 MED ORDER — SPIRIVA RESPIMAT 2.5 MCG/ACT IN AERS: 2.0000 | INHALATION_SPRAY | Freq: Every day | RESPIRATORY_TRACT | 0 refills | Status: DC

## 2020-01-24 NOTE — Assessment & Plan Note (Signed)
Importance of smoking cessation effort emphasized.  He hasn't made personal committment

## 2020-01-24 NOTE — Patient Instructions (Addendum)
Please don't smoke- this is really important !  Continue using Symbicort maintenance inhaler 2 puffs, then rinse mouth, twice daily every day  Refill script sent for albuterol rescue inhaler  2 puffs every 4-6 hours, if needed  Sample Spiriva 2.5 inhaler    Inhale 2 puffs, once daily   See if this helps you breathe better  Order- DME [facility]- please replace old CPAP machine, auto 10-20, mask of choice, humidifier, supplies, AirView/ card  Order- Pulmonary Rehab   DX COPD mixed type  Please work hard to use your CPAP at least 4 hours every night- it is important for your brain and heart  If your legs keep swelling, let Dr Arelia Sneddon know.  Please call if we can help

## 2020-01-24 NOTE — Progress Notes (Signed)
Patient arrived 19 minutes late therefore post spirometry not performed due to time.  Spiro/DLCO/Pleth performed today.

## 2020-01-24 NOTE — Assessment & Plan Note (Signed)
Try adding sample Spiriva 2.5.  Pulmonary Rehab. Absolute smoking cessation

## 2020-01-24 NOTE — Assessment & Plan Note (Signed)
Blames urinary urgency at night for limiting CPAP use. Old machine. Discussed compliance goals. Plan- replace old machine, continue auto 10-20

## 2020-01-24 NOTE — Progress Notes (Signed)
HPI M smoker, Veteran,  followed by OSA, Restless Legs, and COPD, complicated by  Cardiac Arrest/ ventilator/ Pneumonia in 2020, HBP, Tobacco use, Rhinitis, Morbid Obesity Disabled due to back pain, lives w parents, doesn't drive. Sleep study June 08, 2019 that showed severe obstructive sleep apnea with an AHI at 96/hour   05/22/2019- 52 yoM smoker, for sleep and pulmonary evaluation courtesy of Dr Arelia Sneddon and Dr Bettina Gavia. Having a lot of difficulty staying asleep. Only sleeps in 71min intervals. Falling out of chairs sitting up due to falling asleep. Also has difficulty staying awake., and he snores. Medical problem list includes HBP, Tobacco use, Rhinitis, COPD Seen here and at [facility] in past for respiratory problems, but no longer followed for these.  Epworth score 24 Body weight 350 lbs- reports up 60 lbs in 2 years. Disablity due to low back pain. Lives w parents.Doesn't drive. Over past 10 years sleep fragmented, snoring very loud, daytime sleepiness severe.  Taking clonazepam at night for Restless Legs, also on Doxepin. Indicates partial thyroidectomy(?) as a child, aware of COPD. Denies MI, CVA, DVT/ PE. Leg stays red and Doppler venogram has been ordered.  CXR- 05/08/2019- Mild peribronchial thickening which Suen be related to smoking. No acute cardiopulmonary disease.    01/24/20- 46 yoM smoker, Veteran,  followed by OSA, Restless Legs, and COPD, complicated by  Cardiac Arrest/ ventilator/ Pneumonia in 2020, HBP, Tobacco use, Rhinitis, Morbid Obesity Disabled due to back pain, lives w parents, doesn't drive. ------------------- NP NOTE   10/01/19 visit: [[TEST/ EVENTS :  Sleep study June 08, 2019 shows severe obstructive sleep apnea with AHI 96/hour severe oxygen desaturation minimum O2 saturation 64%, REM behavior sleep disorder pattern with frequent moving shaking and talking. Cardiac Arrest 06/2019 -witnessed PEA arrest , s/p cooling , + bilateral PNA on CT chest -CAP vs aspiration PNA ,  +cocaine, THC , + benzo -klonopin use .   CT chest 06/14/19-Extensive consolidations within the bilateral lower lobes and posterior aspects of the upper lobes, which Frizell be due to multifocal pneumonia.,  Right third through 6 anterior rib fractures.  [ NP Note-NP NOTE 10 10/01/2019 Follow up : COPD , OSA , Pneumonia  Patient returns for a follow-up.  Last seen in the office in June 2020.  He was seen at that time for a pulmonary and sleep consult.  Was having daytime sleepiness and restless sleep.  He underwent a sleep study June 08, 2019 that showed severe obstructive sleep apnea with an AHI at 96/hour.  Patient was recommended to begin nocturnal CPAP.  He was also seen for presumed COPD with a long smoking history.  He was started on Anoro.  Set up for pulmonary function testing.  Unfortunately patient had a witnessed cardiac arrest in early July.  He did have bystander CPR with ROSC . he was hospitalized required ventilator support.  CT chest showed bilateral pneumonia concerning for community-acquired pneumonia versus aspiration pneumonia.  Patient did have a positive reported drug use of marijuana and cocaine.  He was also taking Klonopin.  CT angio chest was negative for PE but bilateral consolidation concerning for pneumonia was noted.  CT head was negative for acute process.  2D echo showed a preserved EF with 60-65%, venous Dopplers were negative for DVT.  Did show a large heterogeneous area in the left popliteal fossa measuring 4.5 x 4.0 cm.  With recommendations to follow-up outpatient wise with primary care provider.  He was treated with aggressive IV antibiotics,  placed on  the cooling protocol, and was extubated successfully. Since discharge patient says breathing is doing about the same.  He does get short of breath with minimum activity.  Has intermittent cough.  He has cut back on smoking he is down to 10 cigarettes a day.  He is currently on Symbicort twice daily.  But using 1 puff twice  daily.  He has been seen by cardiology since discharge.  Is currently on torsemide.  Says his ankle swelling has been less.  He is down 14 pounds.  Says he has not used any cocaine since discharge.  Has smoked marijuana. Encouraged on cessation. Says he does have occasional cough and wheezing.  No fever, discolored mucus, chest pain, orthopnea.  No syncope or palpitations.. Patient has received his CPAP machine.  Says he is trying to wear it.  But does not like it.  His download shows poor compliance however patient says he has worn it at least every night for the last week but this did not show up on our download.  He is switched mask and is now using nasal pillows which he says is somewhat more comfortable but seems to draw him out quite a bit.]] -------------------------------- Now- 01/24/20: Albuterol hfa, Symbicort 160, CPAP auto 10-20/ [facility] Download compliance 0%, AHI 0.8/ hr Body weight today 354 lbs Not using CPAP regularly due to need for frequent nocturia.  Legs beginning to swell again. His PCP just increased diuretic. I suggested taking evening dose at supper time, not later. Using Symbicort twice daily. Dry cough as he lies down. Occasional rescue hfa.  PFT 01/23/2019  no time for bronchodilator due to late arrival)- Mod severe Obstruction, DLCO 85% CXR 10/01/2019-  IMPRESSION: Borderline mild lung hyperinflation compatible with COPD. No acute cardiopulmonary disease.    ROS-see HPI  + = positive Constitutional:    weight loss, night sweats, fevers, chills,+ fatigue, lassitude. HEENT:    headaches, difficulty swallowing, tooth/dental problems, sore throat,       sneezing, itching, ear ache, nasal congestion, post nasal drip, snoring CV:    chest pain, orthopnea, PND, swelling in lower extremities, anasarca,                                  dizziness, palpitations Resp:   +shortness of breath with exertion or at rest.                productive cough,   non-productive cough,  coughing up of blood.              change in color of mucus.  wheezing.   Skin:    rash or lesions. GI:  No-   heartburn, indigestion, abdominal pain, nausea, vomiting, diarrhea,                 change in bowel habits, loss of appetite GU: dysuria, change in color of urine, no urgency or frequency.   flank pain. MS:   joint pain, stiffness, decreased range of motion, back pain. Neuro-     nothing unusual Psych:  change in mood or affect.  depression or anxiety.   memory loss.  OBJ- Physical Exam General-   Drowsy and snoring if not engaged, drifting off in conversation, Oriented, Affect-appropriate, Distress- none acute, + morbidly obese Skin- + stasis dermatitis L lower leg Lymphadenopathy- none Head- atraumatic            Eyes- Gross vision intact,  PERRLA, conjunctivae and secretions clear            Ears- Hearing, canals-normal            Nose- Clear, no-Septal dev, mucus, polyps, erosion, perforation             Throat- Mallampati III , mucosa clear , drainage- none, tonsils- atrophic Neck- flexible , trachea midline, no stridor , thyroid nl, carotid no bruit Chest - symmetrical excursion , unlabored           Heart/CV- RRR , no murmur , no gallop  , no rub, nl s1 s2                           - JVD- none , edema+1, stasis changes+ left lower leg, varices- none           Lung- + stertorous, wheeze+ coarse, cough- none , dullness-none, rub- none           Chest wall-  Abd-  Br/ Gen/ Rectal- Not done, not indicated Extrem- cyanosis- none, clubbing, none, atrophy- none, strength- nl Neuro- grossly intact to observation

## 2020-02-06 ENCOUNTER — Telehealth: Payer: Self-pay | Admitting: Internal Medicine

## 2020-02-06 DIAGNOSIS — J449 Chronic obstructive pulmonary disease, unspecified: Secondary | ICD-10-CM

## 2020-02-06 DIAGNOSIS — G4733 Obstructive sleep apnea (adult) (pediatric): Secondary | ICD-10-CM

## 2020-02-06 NOTE — Telephone Encounter (Signed)
Dr Annamaria Boots please see msg from [facility] regarding CPAP:  FYI pt was non compliant with cpap in oct so they picked up his machine pt will have to requalify for cpap with a new sleep study they are cancelling the order    By Don Broach C     Please advise if you want to order another sleep study, thanks!

## 2020-02-14 NOTE — Telephone Encounter (Signed)
Dr. Young please advise.  Thanks! 

## 2020-02-24 NOTE — Telephone Encounter (Signed)
Dr. Young please advise.  Thanks! 

## 2020-03-02 ENCOUNTER — Encounter (HOSPITAL_COMMUNITY): Payer: Self-pay | Admitting: *Deleted

## 2020-03-02 NOTE — Progress Notes (Signed)
Received referral from Dr. Annamaria Boots for this pt to participate in pulmonary rehab with the the diagnosis of COPD Mixed Type. Clinical review of pt follow up appt on 01/24/20  Pulmonary office note.  Pt with Covid Risk Score - 4. Pt appropriate for scheduling for Pulmonary rehab.  Will forward to support staff for verification of insurance eligibility/benefits and pulmonary rehab staff for scheduling with pt consent. Cherre Huger, BSN Cardiac and Training and development officer

## 2020-03-02 NOTE — Telephone Encounter (Signed)
Pt calling to check status of pulm rehab and cpap.  pulm rehab was never ordered on 2/12 office visit as instructed by CY to do.  Order has been placed.    Regarding cpap, pt needs to have a sleep study and "start over" to get a new cpap.  CY please advise on what sleep study you would like to order.  Thanks!

## 2020-03-02 NOTE — Telephone Encounter (Signed)
Pt calling back regarding getting pulmonary rehab and new cpap machine.  Please advise. [phone]

## 2020-03-03 ENCOUNTER — Telehealth (HOSPITAL_COMMUNITY): Payer: Self-pay

## 2020-03-03 NOTE — Telephone Encounter (Signed)
Pt insurance is active and benefits verified through [facility] Medicare Co-pay 0, DED 0/0 met, out of pocket $7,550/$107.16 met, co-insurance 20%. no pre-authorization required, REF# [id number]

## 2020-03-04 ENCOUNTER — Telehealth (HOSPITAL_COMMUNITY): Payer: Self-pay

## 2020-03-04 NOTE — Telephone Encounter (Signed)
Please explain to him that insurance requires new sleep study  Ok to order HST dx OSA Please ask him to call for result/ recommendations about 2 weeks after his sleep study. We [facility] then be able to restart his CPAP.

## 2020-03-04 NOTE — Telephone Encounter (Signed)
LMTCB for the pt 

## 2020-03-05 NOTE — Telephone Encounter (Signed)
Pt returning call.  [phone]

## 2020-03-05 NOTE — Telephone Encounter (Signed)
Spoke with patient. He stated that the DME did not pick up his machine. He still has it and has been using it. He stated that the DME company stopped monitoring his machine due to non-compliance. He stated that as far as he can tell, there is nothing wrong with the cpap machine.   I did advise him that the order for pulmonary rehab was placed and that [facility] will calling him directly. He was under the impression that our office would be getting scheduled for this.   Routing to CY to let him know that the patient still has his cpap machine.

## 2020-03-09 NOTE — Telephone Encounter (Signed)
Called and spoke pt. Pt states he has paid for the machine and he doesn't see the point in doing another sleep study if he has paid for the machine and still has the machine and its working. Pt stated he would like to switch from [facility] because of these requirements, advised pt that it is not [employer] requiring the study it is insurance and it will not change just by switching to a new DME.   Called [employer] to get more information and spoke with Olivia Mackie. She states the pts machine is not paid for and the pt was sent a box to return the machine in October. [employer] stopped the billing for the pt but if pt needs new supplies his insurance will not pay for this. In order for insurance to continue to cover machine and supplies pt will need to do a new sleep study as recommended by Dr. Annamaria Boots. ATC pt, line rang without VM. WCB to relay this information.

## 2020-03-10 NOTE — Telephone Encounter (Signed)
Spoke with pt, advised message from [place]. Pt agreed to have sleep study done. CY please advise if we can order new sleep study.

## 2020-03-17 NOTE — Telephone Encounter (Signed)
Per Dr. Janee Morn previous message he recommended pt to have a HST. New order placed for HST. Left detailed message for pt to inform him of the new sleep study order and the wait time to schedule those. Also left the message for pt call back after he completes the HST so we can review the results. Will await pt's call back to be sure he received our message.

## 2020-03-18 NOTE — Telephone Encounter (Signed)
Multiple attempts made in regards to pulmonary rehab with no success. Closed referral.

## 2020-03-18 NOTE — Telephone Encounter (Signed)
Called home number and spoke with family member to relay the message for pt to call back.  ATC mobile number and VM was a male.

## 2020-03-19 ENCOUNTER — Telehealth (HOSPITAL_COMMUNITY): Payer: Self-pay

## 2020-03-19 ENCOUNTER — Telehealth: Payer: Self-pay | Admitting: Internal Medicine

## 2020-03-19 NOTE — Telephone Encounter (Signed)
Spoke with pt and advised that Order has been placed for HST and PA has been done. I explained to pt that there is a waiting list to have HST done and that he would receive a call when an appt comes available. Pt also asked about status of pulm rehab referral. I explained to pt that per referral notes they had tried to call him to get him scheduled but had been unable to reach him. I gave pt number to pulm rehab for him to call them to schedule. Nothing futher needed at this time.

## 2020-03-19 NOTE — Telephone Encounter (Signed)
Pt returned PR phone call and stated he would like to participate in PR. Spoke with Kirk Ruths who stated he was about to start a class and will contact pt afterwards. Pt stated he will be out for the rest of the day and a good time to contact him will be 4/9 in the morning. Passed this information, along with a good phone number for the pt to Dalton.

## 2020-03-20 ENCOUNTER — Telehealth (HOSPITAL_COMMUNITY): Payer: Self-pay

## 2020-03-20 NOTE — Telephone Encounter (Signed)
Called pt and spoke with his mother [facility]) letting her know that order has been placed for pt to repeat HST and stated to her that we would contact pt once able to get him in to pick up machine to do the study. She verbalized understanding and stated she would let pt know. Nothing further needed.

## 2020-04-02 ENCOUNTER — Telehealth (HOSPITAL_COMMUNITY): Payer: Self-pay | Admitting: *Deleted

## 2020-04-02 NOTE — Telephone Encounter (Signed)
LM with patient's mother of his appointment 04/06/2020@ 0900 in pulmonary rehab for his orientation/walk test.

## 2020-04-06 ENCOUNTER — Telehealth (HOSPITAL_COMMUNITY): Payer: Self-pay

## 2020-04-06 ENCOUNTER — Ambulatory Visit (HOSPITAL_COMMUNITY): Payer: Medicare Other

## 2020-04-07 ENCOUNTER — Telehealth (HOSPITAL_COMMUNITY): Payer: Self-pay | Admitting: *Deleted

## 2020-04-07 ENCOUNTER — Encounter: Payer: Self-pay | Admitting: Gastroenterology

## 2020-04-07 NOTE — Telephone Encounter (Signed)
LM with his mother that with him cancelling his orientation/walk test in pulmonary rehab for this past Monday 04/06/2020 he will move to the wait list to get into the program.  We are filled through the last of [facility].  Will call when program has openings.

## 2020-04-23 ENCOUNTER — Ambulatory Visit: Payer: Medicare Other | Admitting: Internal Medicine

## 2020-04-27 ENCOUNTER — Other Ambulatory Visit: Payer: Self-pay

## 2020-04-27 ENCOUNTER — Ambulatory Visit (INDEPENDENT_AMBULATORY_CARE_PROVIDER_SITE_OTHER): Payer: Medicare Other | Admitting: Internal Medicine

## 2020-04-27 ENCOUNTER — Encounter: Payer: Self-pay | Admitting: Internal Medicine

## 2020-04-27 DIAGNOSIS — F17200 Nicotine dependence, unspecified, uncomplicated: Secondary | ICD-10-CM | POA: Diagnosis not present

## 2020-04-27 DIAGNOSIS — G4733 Obstructive sleep apnea (adult) (pediatric): Secondary | ICD-10-CM | POA: Diagnosis not present

## 2020-04-27 DIAGNOSIS — Z6841 Body Mass Index (BMI) 40.0 and over, adult: Secondary | ICD-10-CM | POA: Diagnosis not present

## 2020-04-27 DIAGNOSIS — J449 Chronic obstructive pulmonary disease, unspecified: Secondary | ICD-10-CM | POA: Diagnosis not present

## 2020-04-27 MED ORDER — SPIRIVA RESPIMAT 2.5 MCG/ACT IN AERS: 2.0000 | INHALATION_SPRAY | Freq: Every day | RESPIRATORY_TRACT | 12 refills | Status: DC

## 2020-04-27 MED ORDER — ALBUTEROL SULFATE HFA 108 (90 BASE) MCG/ACT IN AERS: INHALATION_SPRAY | RESPIRATORY_TRACT | 12 refills | Status: DC

## 2020-04-27 NOTE — Assessment & Plan Note (Signed)
He remains obese. Not prepared to make life-style changes needed to accomplish weight loss.

## 2020-04-27 NOTE — Assessment & Plan Note (Signed)
Down to 5 cigs/ day by his report.  Plan- Complete cessation

## 2020-04-27 NOTE — Assessment & Plan Note (Signed)
He needs HST to update for insurance before replacing old CPAP machine. Asks about [facility]- discussed briefly. He is probably too heavy to be accepted, but we can refer him if desired, after HST. He wanted to defer HST until GI w/u for abd pain, later in [place].

## 2020-04-27 NOTE — Patient Instructions (Signed)
Refill scripts sent for albuterol rescue inhaler and Spiriva  Please call us to reschedule your home sleep test when your GI problem is taken care of.  Please keep trying to stop smoking.  I do recommend that you get the Covid vaccine as soon as you can.  Please call if we can help

## 2020-04-27 NOTE — Progress Notes (Signed)
HPI M smoker, Veteran,  followed by OSA, Restless Legs, and COPD, complicated by  Cardiac Arrest/ ventilator/ Pneumonia in 2020, HBP, Tobacco use, Rhinitis, Morbid Obesity Disabled due to back pain, lives w parents, doesn't drive. Sleep study June 08, 2019 that showed severe obstructive sleep apnea with an AHI at 96/hour PFT 01/23/2019  no time for bronchodilator due to late arrival)- Mod severe Obstruction, DLCO 85% -------------------------------------------------------------------------------------------------------- 05/22/2019- 52 yoM smoker, for sleep and pulmonary evaluation courtesy of Dr Arelia Sneddon and Dr Bettina Gavia. Having a lot of difficulty staying asleep. Only sleeps in 24min intervals. Falling out of chairs sitting up due to falling asleep. Also has difficulty staying awake., and he snores. Medical problem list includes HBP, Tobacco use, Rhinitis, COPD Seen here and at [facility] in past for respiratory problems, but no longer followed for these.  Epworth score 24 Body weight 350 lbs- reports up 60 lbs in 2 years. Disablity due to low back pain. Lives w parents.Doesn't drive. Over past 10 years sleep fragmented, snoring very loud, daytime sleepiness severe.  Taking clonazepam at night for Restless Legs, also on Doxepin. Indicates partial thyroidectomy(?) as a child, aware of COPD. Denies MI, CVA, DVT/ PE. Leg stays red and Doppler venogram has been ordered.  CXR- 05/08/2019- Mild peribronchial thickening which Aronov be related to smoking. No acute cardiopulmonary disease.  01/24/20- 41 yoM smoker, Veteran,  followed by OSA, Restless Legs, and COPD, complicated by  Cardiac Arrest/ ventilator/ Pneumonia in 2020, HBP, Tobacco use, Rhinitis, Morbid Obesity Disabled due to back pain, lives w parents, doesn't drive. 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  Cardiac Arrest 06/2019 -witnessed PEA arrest , s/p cooling , + bilateral PNA on CT chest -CAP  vs aspiration PNA , +cocaine, THC , + benzo -klonopin use .   CT chest 06/14/19-Extensive consolidations within the bilateral lower lobes and posterior aspects of the upper lobes, which Lenn be due to multifocal pneumonia.,  Right third through 6 anterior rib fractures.  [ NP Note-NP NOTE 10 10/01/2019 Follow up : COPD , OSA , Pneumonia  Patient returns for a follow-up.  Last seen in the office in June 2020.  He was seen at that time for a pulmonary and sleep consult.  Was having daytime sleepiness and restless sleep.  He underwent a sleep study June 08, 2019 that showed severe obstructive sleep apnea with an AHI at 96/hour.  Patient was recommended to begin nocturnal CPAP.  He was also seen for presumed COPD with a long smoking history.  He was started on Anoro.  Set up for pulmonary function testing.  Unfortunately patient had a witnessed cardiac arrest in early July.  He did have bystander CPR with ROSC . he was hospitalized required ventilator support.  CT chest showed bilateral pneumonia concerning for community-acquired pneumonia versus aspiration pneumonia.  Patient did have a positive reported drug use of marijuana and cocaine.  He was also taking Klonopin.  CT angio chest was negative for PE but bilateral consolidation concerning for pneumonia was noted.  CT head was negative for acute process.  2D echo showed a preserved EF with 60-65%, venous Dopplers were negative for DVT.  Did show a large heterogeneous area in the left popliteal fossa measuring 4.5 x 4.0 cm.  With  recommendations to follow-up outpatient wise with primary care provider.  He was treated with aggressive IV antibiotics,  placed on the cooling protocol, and was extubated successfully. Since discharge patient says breathing is doing about the same.  He does get short  of breath with minimum activity.  Has intermittent cough.  He has cut back on smoking he is down to 10 cigarettes a day.  He is currently on Symbicort twice daily.  But using 1 puff twice daily.  He has been seen by cardiology since discharge.  Is currently on torsemide.  Says his ankle swelling has been less.  He is down 14 pounds.  Says he has not used any cocaine since discharge.  Has smoked marijuana. Encouraged on cessation. Says he does have occasional cough and wheezing.  No fever, discolored mucus, chest pain, orthopnea.  No syncope or palpitations.. Patient has received his CPAP machine.  Says he is trying to wear it.  But does not like it.  His download shows poor compliance however patient says he has worn it at least every night for the last week but this did not show up on our download.  He is switched mask and is now using nasal pillows which he says is somewhat more comfortable but seems to draw him out quite a bit.]] -------------------------------- Now- 01/24/20: Albuterol hfa, Symbicort 160, CPAP auto 10-20/ [employer] Download compliance 0%, AHI 0.8/ hr Body weight today 354 lbs Not using CPAP regularly due to need for frequent nocturia.  Legs beginning to swell again. His PCP just increased diuretic. I suggested taking evening dose at supper time, not later. Using Symbicort twice daily. Dry cough as he lies down. Occasional rescue hfa. PFT 01/23/2019  no time for bronchodilator due to late arrival)- Mod severe Obstruction, DLCO 85% CXR 10/01/2019-  IMPRESSION: Borderline mild lung hyperinflation compatible with COPD. No acute cardiopulmonary disease.    04/27/20- 73 yoM Smoker, Veteran,  followed by OSA, Restless Legs, and COPD, complicated by  Cardiac Arrest/ ventilator/ Pneumonia in 2020, HBP, Tobacco use, Rhinitis, Morbid Obesity Disabled due to back pain, lives w parents, doesn't drive. Albuterol hfa, Symbicort 160, sample Spiriva 2.5 CPAP auto 10-20/ [employer] At last visit  we had given Spiriva sample, referred for Pulmonary Rehab. Replacement of old CPAP needed updated sleep study- ordered by not done. He says he cancelled until he could see GI about abdominal pain. -----f/u COPD mixed type/ OSA  Body weight today 368 lbs No Covax yet Spiriva helps a lot- asks Rx. Breathing better.  Edema and dyspnea improved by increased torsemide to 3 daily, but has to deal with urinary urgency. Does intend to get HST, but didn't like CPAP first time around and asks about [facility].  ROS-see HPI  + = positive Constitutional:    weight loss, night sweats, fevers, chills,+ fatigue, lassitude. HEENT:    headaches, difficulty swallowing, tooth/dental problems, sore throat,       sneezing, itching, ear ache, nasal congestion, post nasal drip, snoring CV:    chest pain, orthopnea, PND, swelling in lower extremities, anasarca,                                  dizziness, palpitations Resp:   +shortness of breath with exertion or at rest.                productive cough,   non-productive cough, coughing up of blood.  change in color of mucus.  wheezing.   Skin:    rash or lesions. GI:  No-   heartburn, indigestion, abdominal pain, nausea, vomiting, diarrhea,                 change in bowel habits, loss of appetite GU: dysuria, change in color of urine, no urgency or frequency.   flank pain. MS:   joint pain, stiffness, decreased range of motion, back pain. Neuro-     nothing unusual Psych:  change in mood or affect.  depression or anxiety.   memory loss.  OBJ- Physical Exam General-   Alert,  Oriented, Affect-appropriate, Distress- none acute, + morbidly obese Skin- + stasis dermatitis L lower leg Lymphadenopathy- none Head- atraumatic            Eyes- Gross vision intact, PERRLA, conjunctivae and secretions clear            Ears- Hearing, canals-normal            Nose- Clear, no-Septal dev, mucus, polyps, erosion, perforation             Throat- Mallampati III ,  mucosa clear , drainage- none, tonsils- atrophic Neck- flexible , trachea midline, no stridor , thyroid nl, carotid no bruit Chest - symmetrical excursion , unlabored           Heart/CV- RRR , no murmur , no gallop  , no rub, nl s1 s2                           - JVD- none , edema- none, stasis changes+ left lower leg, varices- none           Lung- clear, wheeze- none, cough- none , dullness-none, rub- none           Chest wall-  Abd-  Br/ Gen/ Rectal- Not done, not indicated Extrem- cyanosis- none, clubbing, none, atrophy- none, strength- nl Neuro- grossly intact to observation

## 2020-04-27 NOTE — Assessment & Plan Note (Signed)
Still smoking some, but sounds very much clearer now and likes Spiriva.  Plan- refill Spiriva

## 2020-04-28 NOTE — Telephone Encounter (Signed)
Per Dalton with PR, ok to close referral.

## 2020-05-07 ENCOUNTER — Encounter: Payer: Self-pay | Admitting: Gastroenterology

## 2020-05-07 ENCOUNTER — Other Ambulatory Visit: Payer: Self-pay

## 2020-05-07 ENCOUNTER — Ambulatory Visit (INDEPENDENT_AMBULATORY_CARE_PROVIDER_SITE_OTHER): Payer: Medicare Other | Admitting: Gastroenterology

## 2020-05-07 VITALS — BP 152/98 | HR 88 | Temp 98.3°F | Ht 72.0 in | Wt 379.0 lb

## 2020-05-07 DIAGNOSIS — R195 Other fecal abnormalities: Secondary | ICD-10-CM

## 2020-05-07 DIAGNOSIS — R194 Change in bowel habit: Secondary | ICD-10-CM | POA: Diagnosis not present

## 2020-05-07 NOTE — Progress Notes (Signed)
[facility] Consult Note:  History: Lawrence Medina 05/07/2020  Referring provider: Leonard Downing, MD  Reason for consult/chief complaint: Blood In Stools (Positive hemosure, SOB, hiatal hernia, back pain when defecating, patient does not feel fully evacuated)   Subjective  HPI: Referred by his primary care provider, Dr. Claris Medina, for positive FOBT (hemosure). Some mostly illegible handwritten office notes and some lab results accompanying this consult.  A note from 04/13/2020 documents a prescription for Norco 7.5/325, seems to indicate COPD, OSA, cannot read remainder brief bullet point note.  Previous notes indicate following alkaline phosphatase and periodic urine drug screen.  On April 23 + hemosure, patient notified and referral sent to [name].  Seems to also indicate physician not refilling pain medicine, urine drug screen appears positive for opiates and cocaine. Toxicology screen from 03/12/2020 was negative, including cocaine and opiates.  Alkaline phosphatase elevated at 143, HCV antibody negative.  On 03/12/2020, alkaline phosphatase 148, 48% liver, 49% bone, 3% intestinal.  Lawrence Medina Pulmonary note from 04/27/20 - detailed not reviewed - severe OSA, hospital admission July 2020 cardiac arrest/PEA, b/l PNA, + cocaineand THC "04/27/20- 34 yoM Smoker, Veteran,  followed by OSA, Restless Legs, and COPD, complicated by  Cardiac Arrest/ ventilator/ Pneumonia in 2020, HBP, Tobacco use, Rhinitis, Morbid Obesity Disabled due to back pain, lives w parents, doesn't drive. Albuterol hfa, Symbicort 160, sample Spiriva 2.5 CPAP auto 10-20/ [facility] At last visit we had given Spiriva sample, referred for Pulmonary Rehab. Replacement of old CPAP needed updated sleep study- ordered by not done. He says he cancelled until he could see GI about abdominal pain. -----f/u COPD mixed type/ OSA  Body weight today 368 lbs No Covax yet Spiriva helps a lot- asks Rx. Breathing better.   Edema and dyspnea improved by increased torsemide to 3 daily, but has to deal with urinary urgency. Does intend to get HST, but didn't like CPAP first time around and asks about Inspire" Still smoking _______________  Chart note of difficult intubation July 2020  _____________________________________  Last cardiology note Sept 2020 Lawrence Medina): " 1. Hypertensive heart disease with heart failure (Lawrence Medina)   2. Obstructive sleep apnea syndrome   3. Chronic obstructive pulmonary disease, unspecified COPD type ([name])     PLAN:     In order of problems listed above:   1. Improved minimal fluid overload continue his current diuretic antihypertensives and he tells me had a lab work done this week I requested a copy with normal renal function and potassium. 2. I am gravely concerned by his noncompliance with CPAP at risk of recurrent respiratory arrest and strongly encouraged him to rapidly pursue having his CPAP machine fixed and restarting treatment 3. Proved continue his bronchodilator"  ____________________________________________________  Lawrence Medina is a pleasant man with limited health literacy.  He describes at least 20 years of intermittent pain in his back with bowel movements.  He will have formed BMs once or twice a day, but feels he might be incompletely evacuated, since later in the day he will have some fecal smearing in his underpants.  This has been behaving the same way over decades.  There is intermittently been blood on the paper but no frank rectal bleeding.  Primary care had him do a stool card that was reportedly positive. He has bloating, does not seem to have any focal abdominal pain.  He denies dysphagia nausea or vomiting. Lawrence Medina is dyspneic at rest and more so with minimal exertion of getting on exam table.  He cannot lay flat without getting significantly more dyspneic.  He has never had previous GI evaluation including colonoscopy that he recalls.   ROS:  Review of Systems    Constitutional: Positive for unexpected weight change. Negative for appetite change.       Generalized fatigue  HENT: Negative for mouth sores and voice change.   Eyes: Negative for pain and redness.  Respiratory: Positive for shortness of breath. Negative for cough.   Cardiovascular: Positive for leg swelling. Negative for chest pain and palpitations.  Genitourinary: Negative for dysuria and hematuria.  Musculoskeletal: Positive for arthralgias and back pain. Negative for myalgias.  Skin: Negative for pallor and rash.  Neurological: Negative for weakness and headaches.  Hematological: Negative for adenopathy.   He reports gaining weight and having significantly more peripheral edema since his last visit with the cardiologist.  Past Medical History: Past Medical History:  Diagnosis Date  . Allergic rhinitis   . Arthritis   . Asthma   . Complication of anesthesia   . COPD (chronic obstructive pulmonary disease) ([name])   . DDD (degenerative disc disease), lumbar   . GERD (gastroesophageal reflux disease)   . Headache   . High cholesterol   . Hypertension   . OSA (obstructive sleep apnea)   . PONV (postoperative nausea and vomiting)   . Seizures ([name]) 1982   x1 in 1982- unknown cause- none since  . Shortness of breath dyspnea    with exertion     Past Surgical History: Past Surgical History:  Procedure Laterality Date  . HERNIA REPAIR    . SHOULDER ARTHROSCOPY WITH ROTATOR CUFF REPAIR Left 02/23/2015   Procedure: SHOULDER ARTHROSCOPY WITH ROTATOR CUFF REPAIR BIOTENDONESIS REPAIR;  Surgeon: Lawrence Killings, MD;  Location: [facility];  Service: Orthopedics;  Laterality: Left;  . SUBMANDIBULAR GLAND EXCISION Left 1975  . TOTAL KNEE ARTHROPLASTY Left 1986     Family History: Family History  Problem Relation Age of Onset  . Hypertension Mother   . Sleep apnea Mother   . Skin cancer Mother   . Lung cancer Father     Social History: Social History   Socioeconomic History  .  Marital status: Married    Spouse name: Not on file  . Number of children: Not on file  . Years of education: Not on file  . Highest education level: Not on file  Occupational History  . Not on file  Tobacco Use  . Smoking status: Current Every Day Smoker    Packs/day: 0.25    Years: 15.00    Pack years: 3.75    Types: Cigarettes  . Smokeless tobacco: Never Used  . Tobacco comment: 5 per day   Substance and Sexual Activity  . Alcohol use: Yes    Alcohol/week: 2.0 standard drinks    Types: 2 Cans of beer per week    Comment: 2 beers per week   . Drug use: Yes    Types: Marijuana  . Sexual activity: Yes  Other Topics Concern  . Not on file  Social History Narrative   ** Merged History Encounter **       Social Determinants of Health   Financial Resource Strain:   . Difficulty of Paying Living Expenses:   Food Insecurity:   . Worried About Charity fundraiser in the Last Year:   . Arboriculturist in the Last Year:   Transportation Needs:   . Film/video editor (Medical):   Marland Kitchen Lack  of Transportation (Non-Medical):   Physical Activity:   . Days of Exercise per Week:   . Minutes of Exercise per Session:   Stress:   . Feeling of Stress :   Social Connections:   . Frequency of Communication with Friends and Family:   . Frequency of Social Gatherings with Friends and Family:   . Attends Religious Services:   . Active Member of Clubs or Organizations:   . Attends Archivist Meetings:   Marland Kitchen Marital Status:     Allergies: Allergies  Allergen Reactions  . Codeine Itching    Outpatient Meds: Current Outpatient Medications  Medication Sig Dispense Refill  . albuterol (VENTOLIN HFA) 108 (90 Base) MCG/ACT inhaler Inhale 2 puffs, then rinse mouth, twice daily- maintenance 18 g 12  . HYDROcodone-acetaminophen (NORCO) 7.5-325 MG tablet Take 1 tablet by mouth 3 (three) times daily as needed.    . nitroGLYCERIN (NITROSTAT) 0.4 MG SL tablet Place 1 tablet (0.4 mg  total) under the tongue every 5 (five) minutes as needed for chest pain. 25 tablet 3  . omeprazole (PRILOSEC) 40 MG capsule Take 40 mg by mouth daily as needed.     . sildenafil (VIAGRA) 100 MG tablet Take 100 mg by mouth as needed for erectile dysfunction.    . Tiotropium Bromide Monohydrate (SPIRIVA RESPIMAT) 2.5 MCG/ACT AERS Inhale 2 puffs into the lungs daily. 4 g 12  . torsemide (DEMADEX) 20 MG tablet Take 1 tablet (20 mg total) by mouth 2 (two) times daily. 60 tablet 3   No current facility-administered medications for this visit.      ___________________________________________________________________ Objective   Exam:  BP (!) 152/98 (BP Location: Left Arm, Patient Position: Sitting, Cuff Size: Large)   Pulse 88   Temp 98.3 F (36.8 C)   Ht 6' (1.829 m)   Wt (!) 379 lb (171.9 kg)   BMI 51.40 kg/m    General: Chronically ill-appearing and morbidly obese man dyspneic at rest on room air  Eyes: sclera anicteric, no redness  ENT: oral mucosa moist without lesions, thick neck  CV: RRR without murmur, but heart sounds distant.  Marked peripheral edema with venous stasis changes  Resp: Generalized decreased breath sounds bilaterally, no appreciable wheezing.  Respiratory rate 20 at rest  GI: soft, no focal tenderness, with active bowel sounds.  Cannot assess mass or hepatosplenomegaly due to body habitus.  Skin; warm and dry, no rash or jaundice noted  Neuro: awake, alert and oriented x 3. Normal gross motor function and fluent speech Rectal: limited due to body habitus and patient cooperation.  Could only palpate up to the proximal anal canal.:  Scant stool, heme negative Labs:  Last labs in Epic system July 2020  CBC Latest Ref Rng & Units 06/25/2019 06/24/2019 06/23/2019  WBC 4.0 - 10.5 K/uL 9.6 9.6 7.8  Hemoglobin 13.0 - 17.0 g/dL 14.6 13.7 13.3  Hematocrit 39.0 - 52.0 % 43.5 42.3 43.3  Platelets 150 - 400 K/uL 300 292 305   CMP Latest Ref Rng & Units 06/25/2019  06/24/2019 06/23/2019  Glucose 70 - 99 mg/dL 95 88 99  BUN 6 - 20 mg/dL 31(H) 35(H) 43(H)  Creatinine 0.61 - 1.24 mg/dL 1.04 0.98 1.07  Sodium 135 - 145 mmol/L 137 141 146(H)  Potassium 3.5 - 5.1 mmol/L 3.8 3.3(L) 3.3(L)  Chloride 98 - 111 mmol/L 99 101 105  CO2 22 - 32 mmol/L 24 29 32  Calcium 8.9 - 10.3 mg/dL 9.1 9.2 9.6  Total  Protein 6.5 - 8.1 g/dL - - -  Total Bilirubin 0.3 - 1.2 mg/dL - - -  Alkaline Phos 38 - 126 U/L - - -  AST 15 - 41 U/L - - -  ALT 0 - 44 U/L - - -    Radiologic Studies:  No abdominal imaging on file  Assessment: Encounter Diagnoses  Name Primary?  . Heme positive stool Yes  . Altered bowel habits   Morbid obesity Severe COPD with CO2 retention, dyspneic at rest and worse with minimal exertion and ongoing tobacco use History of cardiac arrest last year, perhaps precipitated by respiratory arrest from pneumonia in the setting of substance abuse and his other severe medical conditions.  His description of bowel habits has been the same for decades and sounds benign.  Bleeding that he has on the toilet paper is most likely benign anorectal bleeding related to constipation. Poor diet lifestyle and limited activity most likely affect his bowel habits as well.  This patient's medical condition absolutely precludes a colonoscopy.  Risks of the sedation would far outweigh the expected benefits.  Plan: Recommend increase dietary fiber, though he seems disinclined to do so.  Unfortunately he has poor health literacy and insight into his medical conditions. I have no additional GI testing planned at present. He is referred back to primary care.  (Total time 45 minutes, extensive chart review and documentation required.)  Thank you for the courtesy of this consult.  Please call me with any questions or concerns.  Nelida Meuse III  CC: Referring provider noted above

## 2020-05-07 NOTE — Patient Instructions (Signed)
If you are age 56 or older, your body mass index should be between 23-30. Your Body mass index is 51.4 kg/m. If this is out of the aforementioned range listed, please consider follow up with your Primary Care Provider.  If you are age 47 or younger, your body mass index should be between 19-25. Your Body mass index is 51.4 kg/m. If this is out of the aformentioned range listed, please consider follow up with your Primary Care Provider.   It was a pleasure to see you today!  Dr. Loletha Carrow

## 2020-05-29 ENCOUNTER — Encounter: Payer: Self-pay | Admitting: Internal Medicine

## 2020-06-20 IMAGING — DX CHEST  1 VIEW
3 series · 3 of 3 positions shown · non-contrast
Comparison: Prior chest x-ray 06/14/2019; CT scan of the chest
06/14/2019

CLINICAL DATA: 52-year-old male with acute respiratory failure and
hypoxia

EXAM:
CHEST  1 VIEW

[chest ap (1 of 3)]
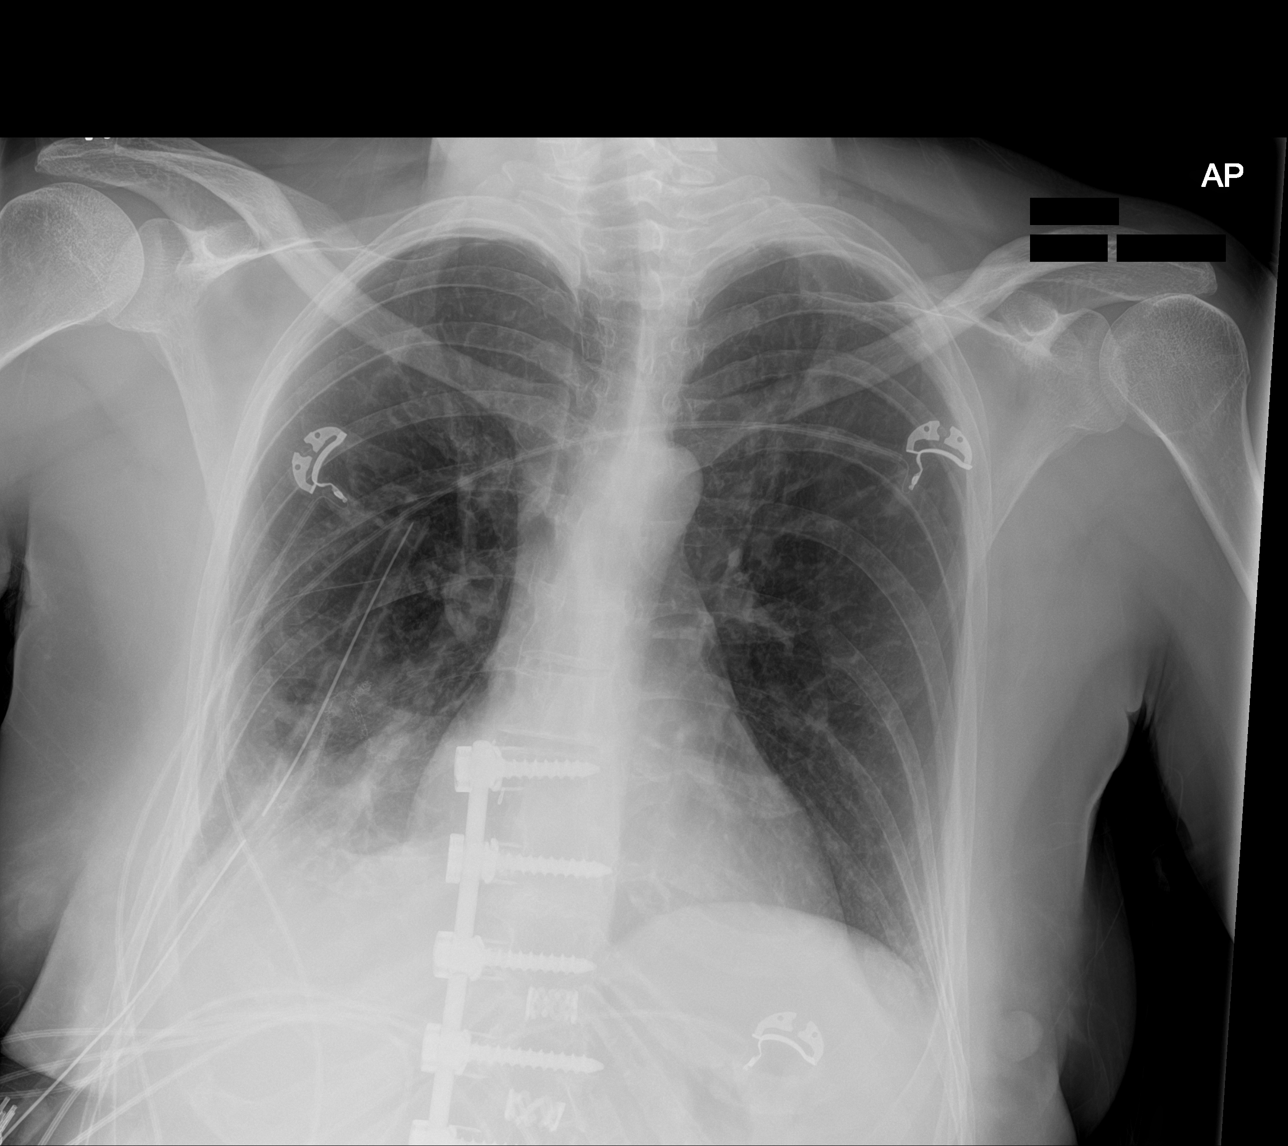

[chest ap (2 of 3)]
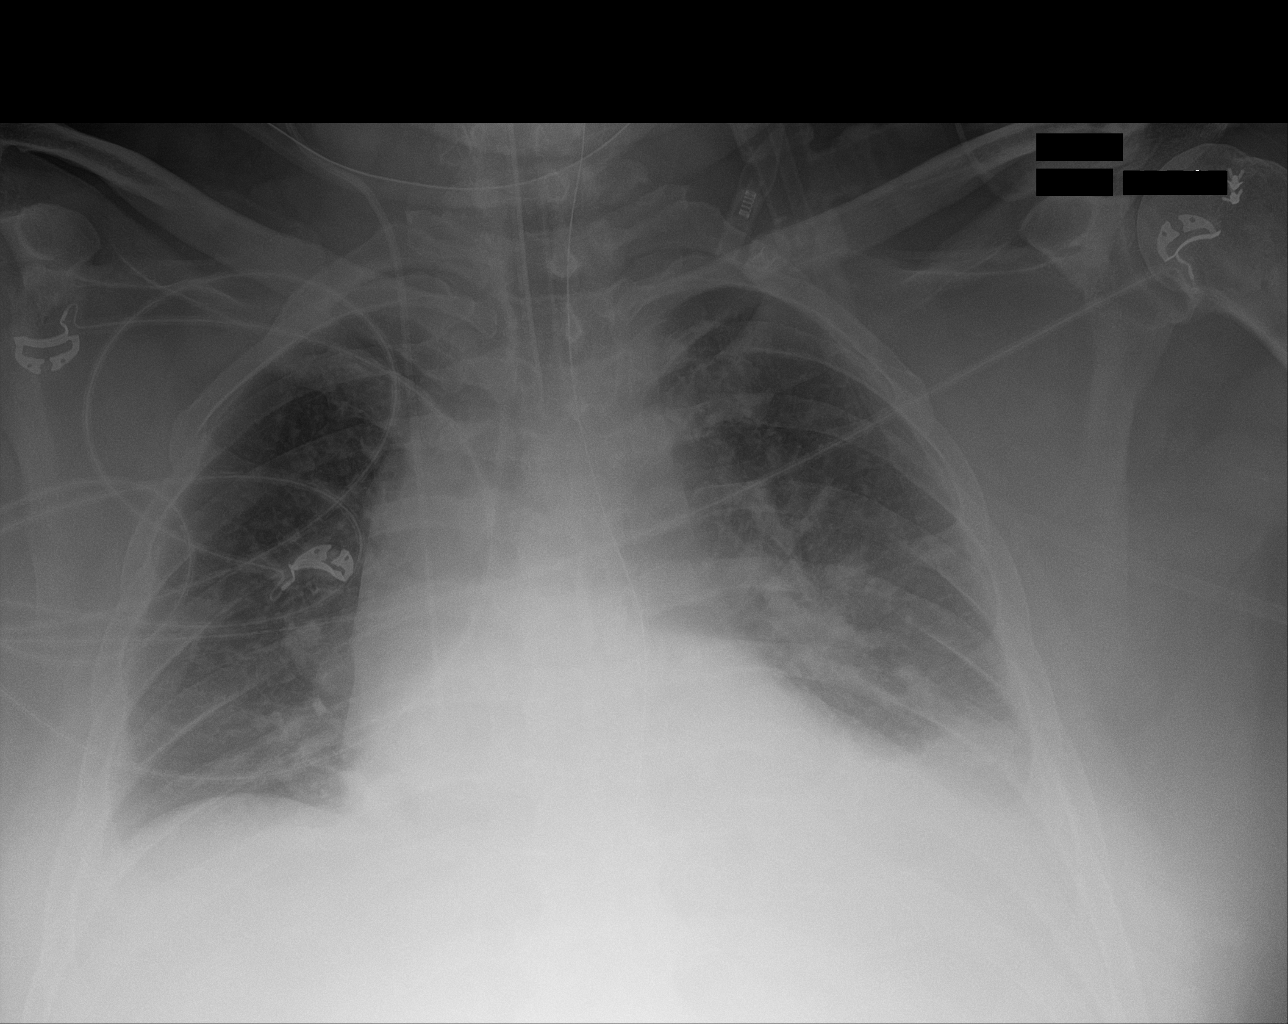

[chest ap (3 of 3)]
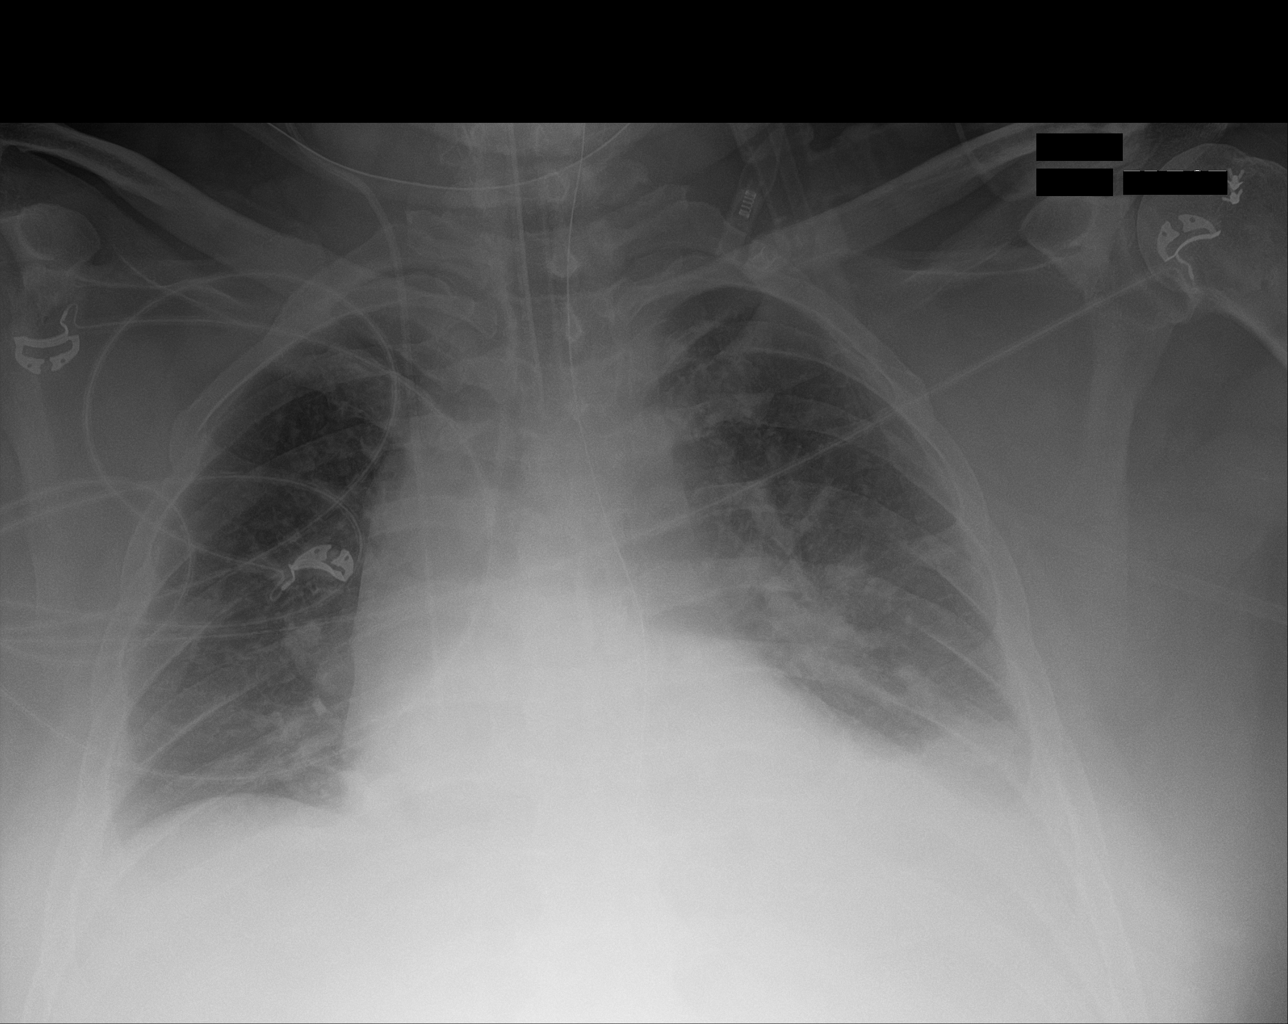

[3 of 3 positions shown; findings below may reference images not displayed]

FINDINGS: The endotracheal tube is 3.4 cm above the carina. A gastric tube is
present. The tip is not identified but lies off the field of view,
below the diaphragm and presumably in the stomach. Right IJ central
venous catheter is present. The tip overlies the superior cavoatrial
junction. Inspiratory volumes remain extremely low. There has been a
significant improvement in aeration in the upper lungs. Persistent
left greater than right bibasilar airspace opacities. No acute
osseous abnormality. No evidence of pneumothorax.
IMPRESSION: 1. Stable and satisfactory support apparatus.
2. Significant interval improvement in bilateral upper lobe aeration
compared to 06/14/2019.
3. Very low inspiratory volumes with nonspecific left greater than
right basilar airspace opacities. These opacities may reflect
atelectasis and/or infiltrate. Small pleural effusions are not
excluded.

## 2020-06-21 IMAGING — DX PORTABLE CHEST - 1 VIEW
1 series · 1 of 1 positions shown · non-contrast
Comparison: 06/16/2019.

CLINICAL DATA: Respiratory failure.

EXAM:
PORTABLE CHEST 1 VIEW

[chest ap]
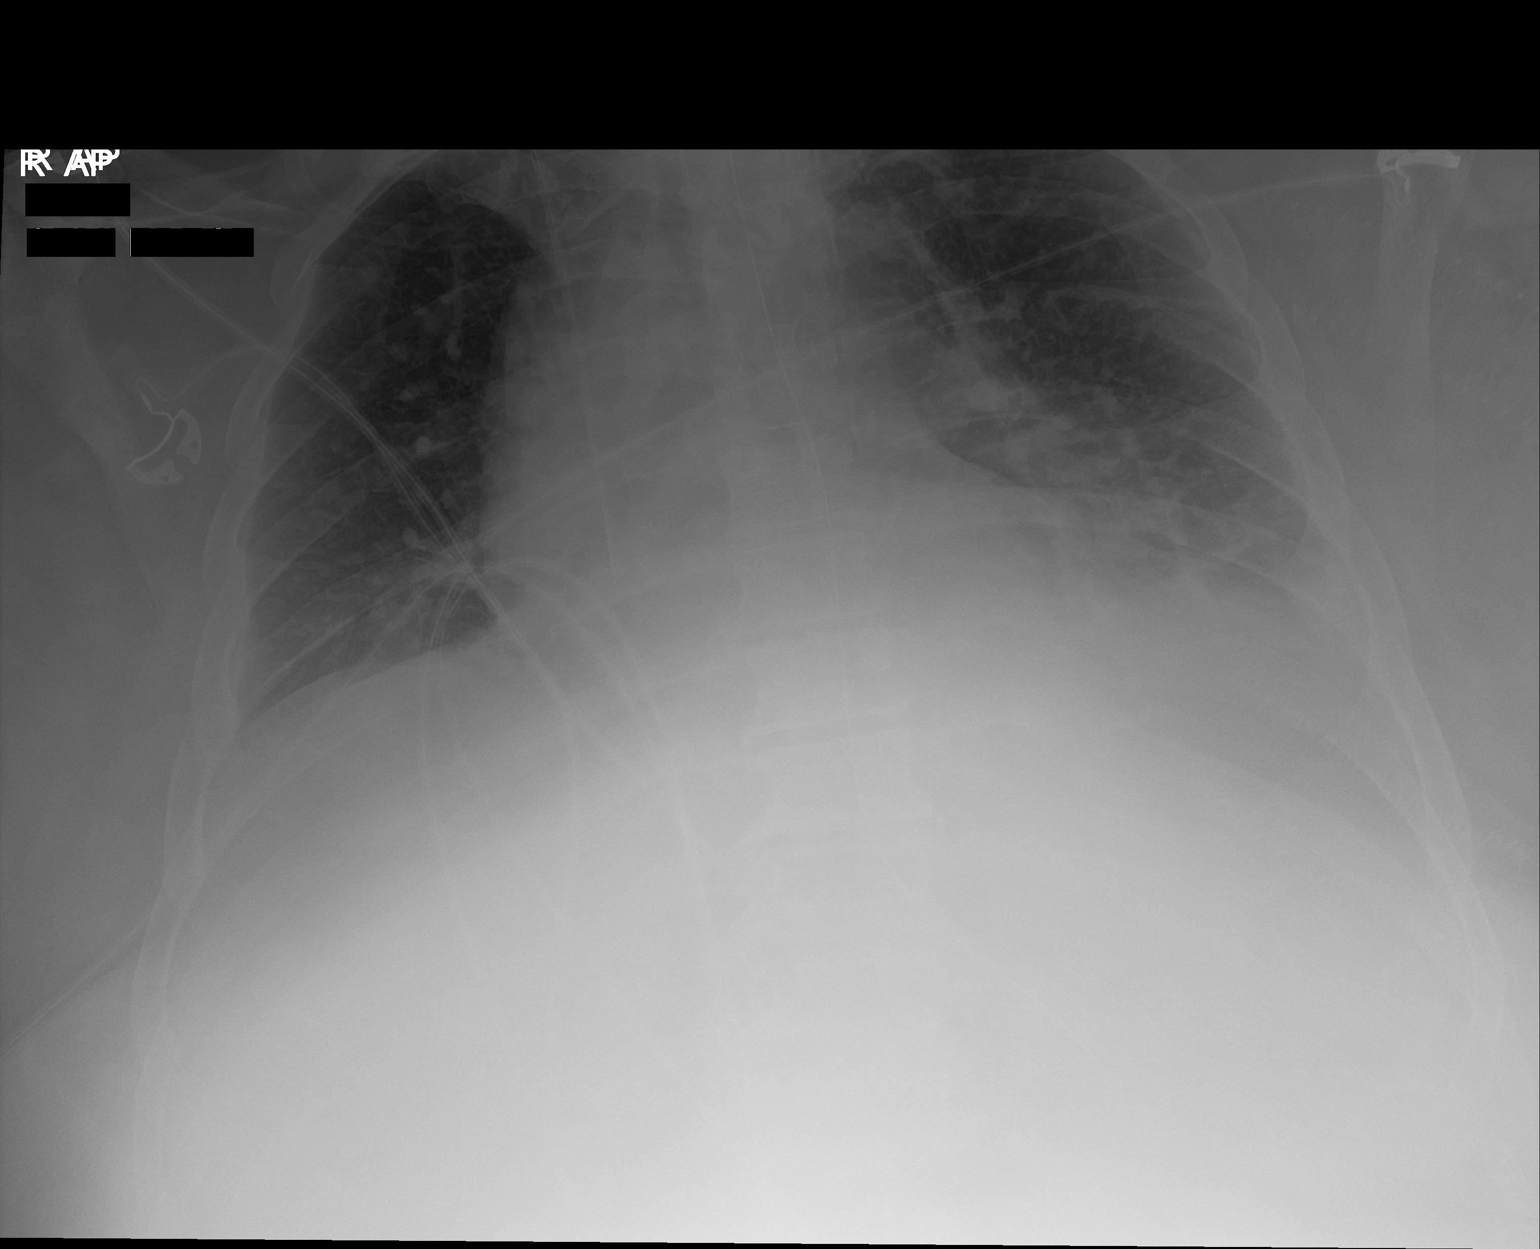

[1 of 1 positions shown; findings below may reference images not displayed]

FINDINGS: Endotracheal tube, NG tube, right IJ line stable position.
Cardiomegaly with normal pulmonary vascularity. Bibasilar
atelectasis. Left base infiltrate again cannot be excluded. No
interim change. Small left pleural effusion again noted.
IMPRESSION: Low lung volumes with bibasilar atelectasis. Left base infiltrate
again cannot be excluded. No interim change. Small left pleural
effusion again noted.

## 2020-06-24 IMAGING — DX PORTABLE CHEST - 1 VIEW
1 series · 1 of 1 positions shown · non-contrast
Comparison: 06/18/2019.  Chest CT 06/14/2019.

CLINICAL DATA: Intubation.  Respiratory failure.

EXAM:
PORTABLE CHEST 1 VIEW

[chest ap]
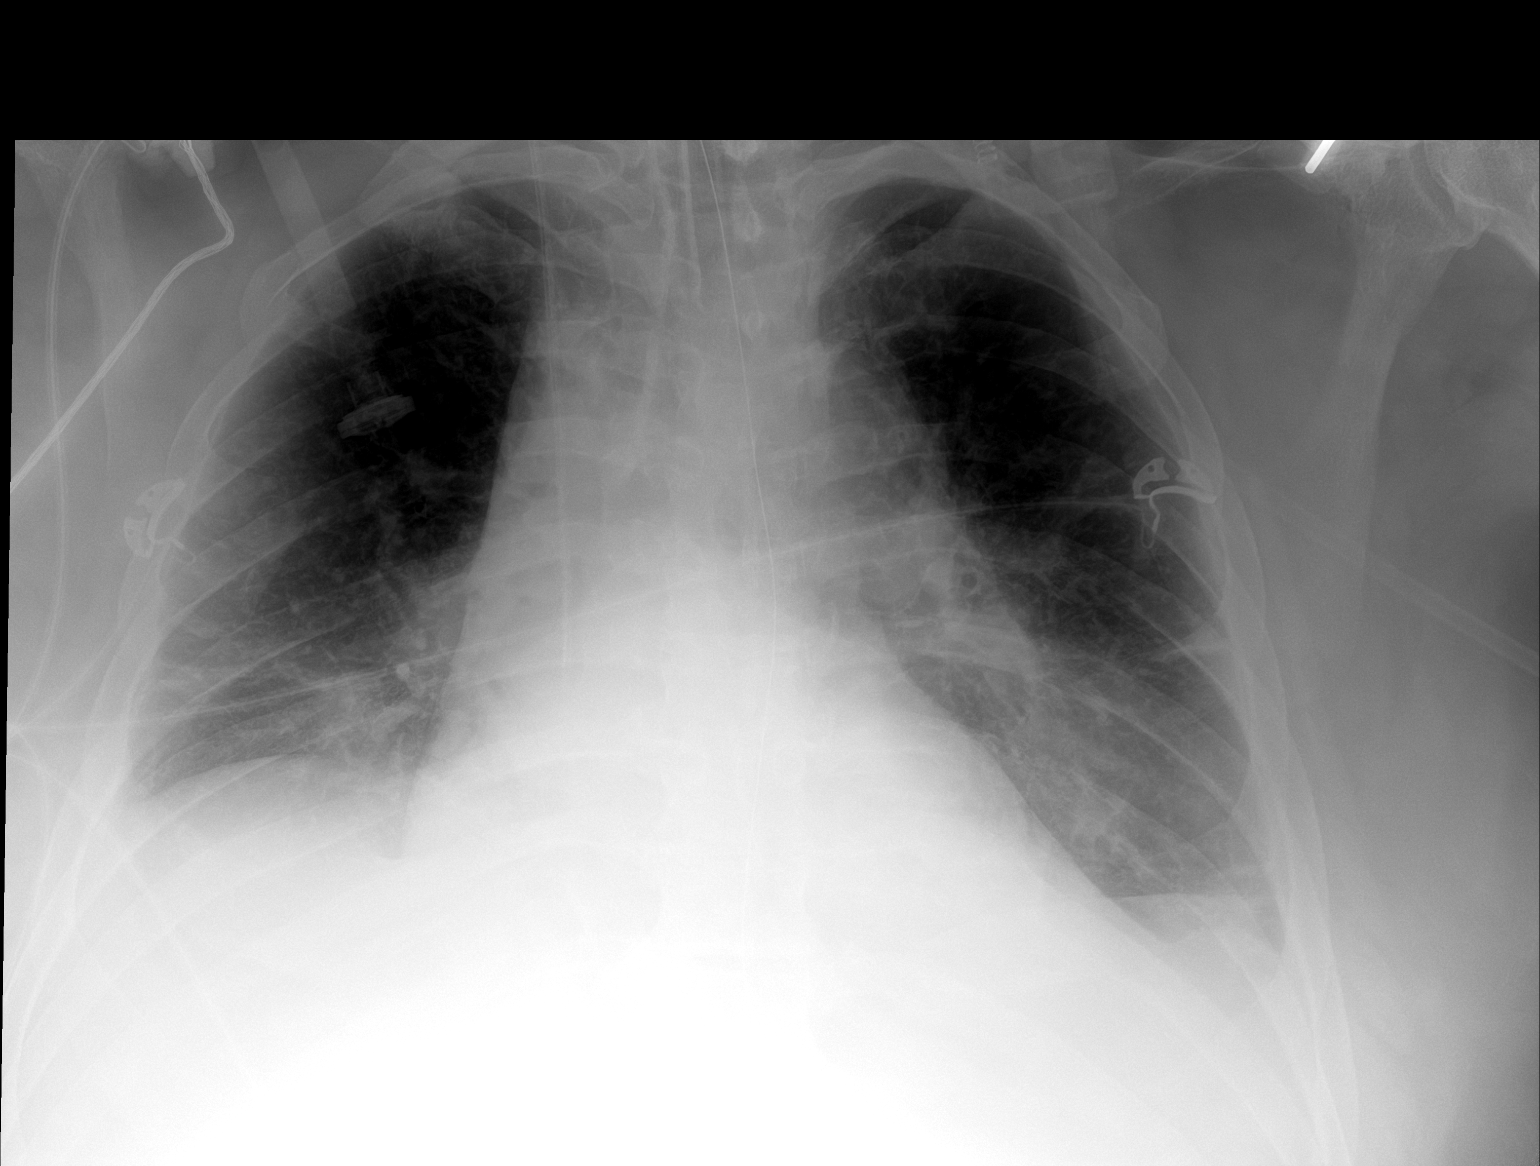

[1 of 1 positions shown; findings below may reference images not displayed]

FINDINGS: Endotracheal tube, NG tube, right IJ line stable position. Stable
cardiomegaly. Persistent bibasilar atelectasis/infiltrates with
improved aeration from prior exam. Tiny bilateral pleural effusions
again noted. No pneumothorax.Rib fractures best identified by prior
CT.
IMPRESSION: 1.  Lines and tubes in stable position.  No pneumothorax.

2.  Stable cardiomegaly.

3. Persistent bibasilar atelectasis/infiltrates with improved
aeration from prior exam. Tiny bilateral pleural effusions again
noted.

4.  Rib fractures best identified by prior CT.

## 2020-06-25 IMAGING — DX PORTABLE CHEST - 1 VIEW
1 series · 1 of 1 positions shown · non-contrast
Comparison: 06/20/2019.

CLINICAL DATA: Intubation.  Respiratory failure.

EXAM:
PORTABLE CHEST 1 VIEW

[chest ap]
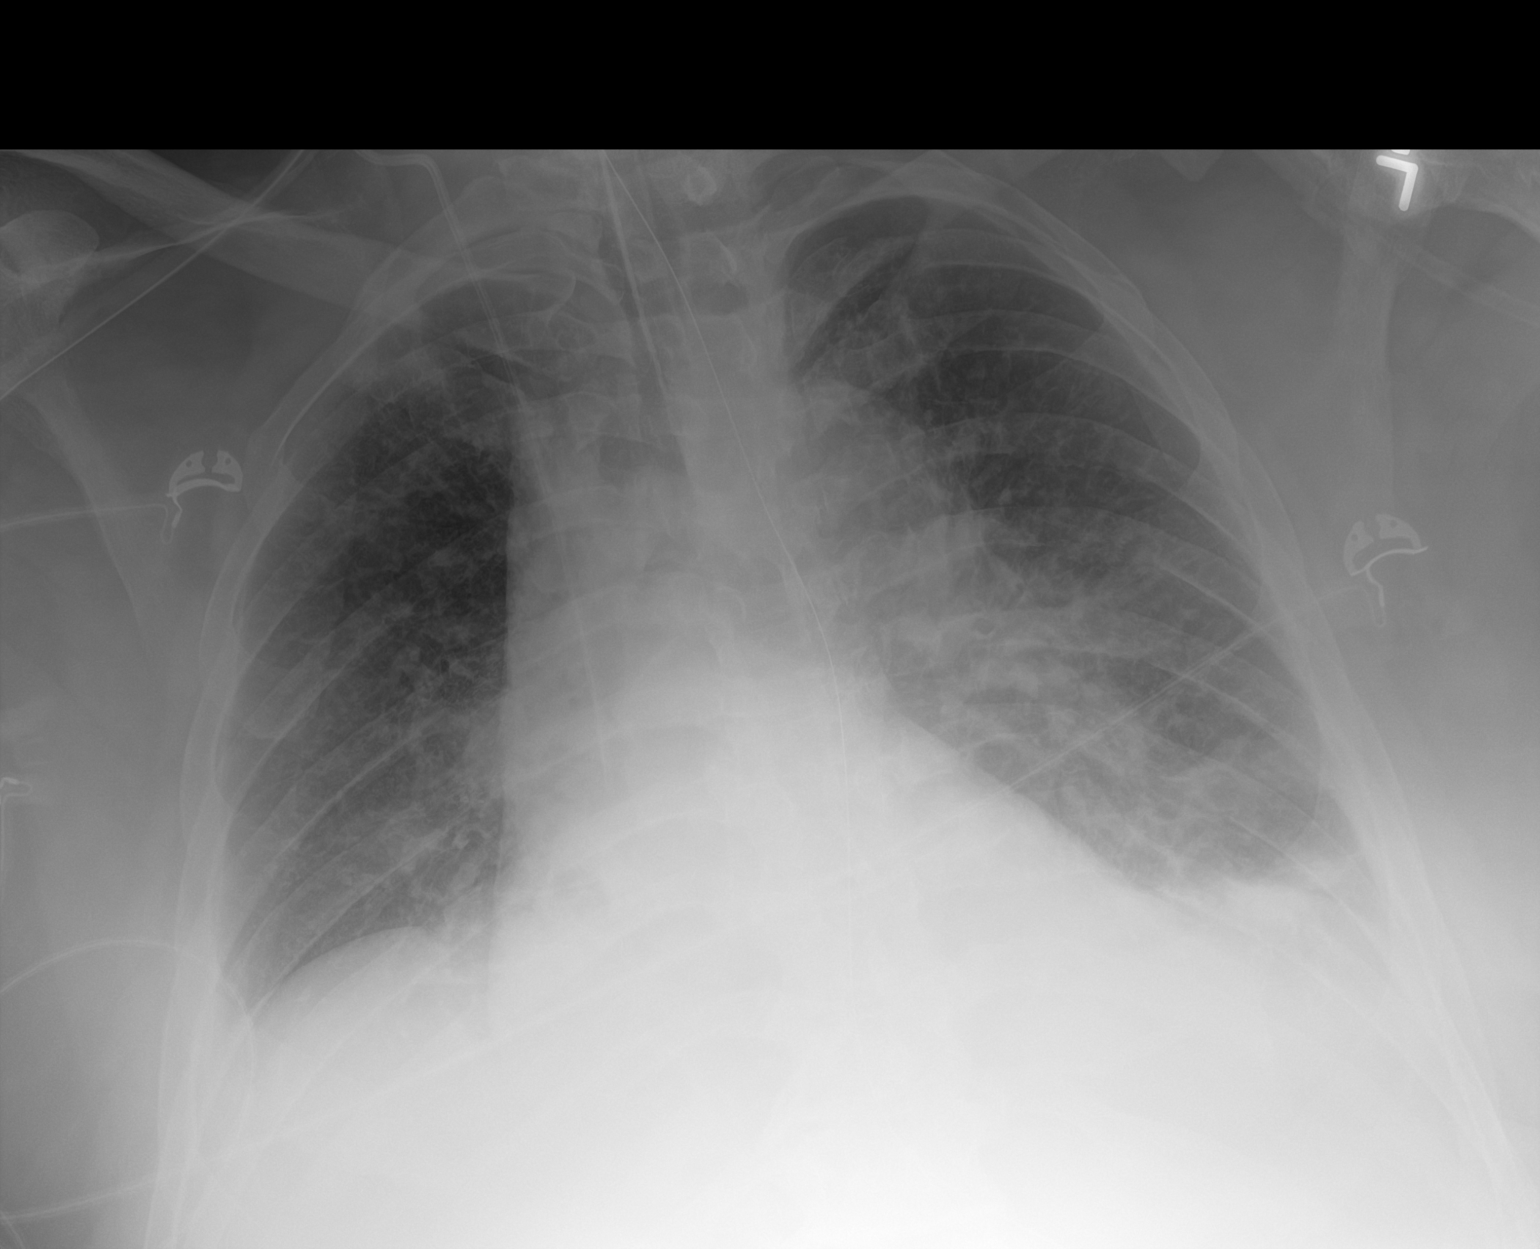

[1 of 1 positions shown; findings below may reference images not displayed]

FINDINGS: Endotracheal tube, NG tube, right IJ line in stable position. Stable
cardiomegaly. Persistent left base atelectasis and infiltrate.
Improved right base atelectasis. Tiny bilateral pleural effusions
again noted. No pneumothorax.
IMPRESSION: 1.  Lines and tubes in stable position.

2.  Stable cardiomegaly.

3. Persistent left base atelectasis and infiltrate. Improved right
base atelectasis. Tiny bilateral pleural effusions again noted.

## 2020-08-28 ENCOUNTER — Other Ambulatory Visit: Payer: Self-pay

## 2020-08-28 ENCOUNTER — Ambulatory Visit (INDEPENDENT_AMBULATORY_CARE_PROVIDER_SITE_OTHER): Payer: Medicare Other | Admitting: Internal Medicine

## 2020-08-28 ENCOUNTER — Encounter: Payer: Self-pay | Admitting: Internal Medicine

## 2020-08-28 ENCOUNTER — Ambulatory Visit (INDEPENDENT_AMBULATORY_CARE_PROVIDER_SITE_OTHER): Payer: Medicare Other

## 2020-08-28 VITALS — BP 126/80 | HR 103 | Temp 97.3°F | Ht 72.0 in | Wt 359.4 lb

## 2020-08-28 DIAGNOSIS — J449 Chronic obstructive pulmonary disease, unspecified: Secondary | ICD-10-CM | POA: Diagnosis not present

## 2020-08-28 DIAGNOSIS — G4733 Obstructive sleep apnea (adult) (pediatric): Secondary | ICD-10-CM

## 2020-08-28 MED ORDER — SPIRIVA RESPIMAT 2.5 MCG/ACT IN AERS: 2.0000 | INHALATION_SPRAY | Freq: Every day | RESPIRATORY_TRACT | 12 refills | Status: AC

## 2020-08-28 MED ORDER — ALBUTEROL SULFATE HFA 108 (90 BASE) MCG/ACT IN AERS: INHALATION_SPRAY | RESPIRATORY_TRACT | 12 refills | Status: DC

## 2020-08-28 NOTE — Progress Notes (Signed)
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 PFT 01/24/2020  no time for bronchodilator due to late arrival)- Mod severe Obstruction, DLCO 85% --------------------------------------------------------------------------------------------------.  04/27/20- 70 yoM Smoker, Veteran,  followed by OSA, Restless Legs, and COPD, complicated by  Hx Cardiac Arrest/ ventilator/ Pneumonia in 2020, HBP, Tobacco use, Rhinitis, Morbid Obesity Disabled due to back pain, lives w parents, doesn't drive. Albuterol hfa, Symbicort 160, sample Spiriva 2.5 CPAP auto 10-20/ [facility] At last visit we had given Spiriva sample, referred for Pulmonary Rehab. Replacement of old CPAP needed updated sleep study- ordered by not done. He says he cancelled until he could see GI about abdominal pain. -----f/u COPD mixed type/ OSA  Body weight today 368 lbs No Covax yet Spiriva helps a lot- asks Rx. Breathing better.  Edema and dyspnea improved by increased torsemide to 3 daily, but has to deal with urinary urgency. Does intend to get HST, but didn't like CPAP first time around and asks about Inspire.  08/28/20- 64 yoM Smoker, Veteran,  followed for OSA, Restless Legs, and COPD, complicated by  Cardiac Arrest/ ventilator/ Pneumonia in 2020, HBP, Tobacco use, Rhinitis, Morbid Obesity Disabled due to back pain, lives w parents,  Albuterol hfa, Symbicort 160, sample Spiriva 2.5 HST ordered 4/6- not done- couldn't afford then CPAP auto 10-20/ [facility]   Download compliance 0 % -----breathing better.  needs tooth extracted.   Body weight today- 359 lbs Covid Vax-Flu Vax- J&J He says he is using CPAP almost every night and it helps, old machine. Says [facility] has no record of him.  Occ sharp stabbing mid -sternal twinge.  Breathing much better this year. Uses spiriva only every other day w rare use of rescue inhaler. Still smoking 10 cigs/ day. CXR 10/01/19-  IMPRESSION: Borderline mild lung hyperinflation compatible with COPD. No acute cardiopulmonary disease.  ROS-see HPI  + = positive Constitutional:    weight loss, night sweats, fevers, chills,+ fatigue, lassitude. HEENT:    headaches, difficulty swallowing, tooth/dental problems, sore throat,       sneezing, itching, ear ache, nasal congestion, post nasal drip, snoring CV:    chest pain, orthopnea, PND, swelling in lower extremities, anasarca,                                  dizziness, palpitations Resp:   +shortness of breath with exertion or at rest.                productive cough,   non-productive cough, coughing up of blood.              change in color of mucus.  wheezing.   Skin:    rash or lesions. GI:  No-   heartburn, indigestion, abdominal pain, nausea, vomiting, diarrhea,                 change in bowel habits, loss of appetite GU: dysuria, change in  color of urine, no urgency or frequency.   flank pain. MS:   joint pain, stiffness, decreased range of motion, back pain. Neuro-     nothing unusual Psych:  change in mood or affect.  depression or anxiety.   memory loss.  OBJ- Physical Exam General-   Alert,  Oriented, Affect-appropriate, Distress- none acute, + morbidly obese Skin- + stasis dermatitis L lower leg Lymphadenopathy- none Head- atraumatic            Eyes- Gross vision intact, PERRLA, conjunctivae and secretions clear            Ears- Hearing, canals-normal            Nose- Clear, no-Septal dev, mucus, polyps, erosion, perforation             Throat-  Mallampati III , mucosa clear , drainage- none, tonsils- atrophic Neck- flexible , trachea midline, no stridor , thyroid nl, carotid no bruit Chest - symmetrical excursion , unlabored           Heart/CV- RRR , no murmur , no gallop  , no rub, nl s1 s2                           - JVD- none , edema- none, stasis changes+ left lower leg, varices- none           Lung- clear, wheeze- none, cough- none , dullness-none, rub- none           Chest wall-  Abd-  Br/ Gen/ Rectal- Not done, not indicated Extrem- cyanosis- none, clubbing, none, atrophy- none, strength- nl Neuro- grossly intact to observation

## 2020-08-28 NOTE — Patient Instructions (Addendum)
Order-  CXR-   Dx COPD mixed type  Order- reschedule home sleep test    Dx OSA                Please call us for results about 2 weeks after this test. We Myre be able to go ahead then and ask [facility] to replace your old CPAP machine.  Scripts sent refilling your inhalers.  Please try to set your mind to quitting stopping.  Please call if we can help.

## 2020-08-28 NOTE — Assessment & Plan Note (Signed)
He states he benefits from CPAP and uses his old machine almost every night. Plan- [facility] to replace old machine auto 10-20, with AirView/ card

## 2020-08-28 NOTE — Assessment & Plan Note (Signed)
Reports breathing much more easily- perhaps smoking less, better control of fluid, and use of Spiriva. Plan- CXR, refill inhalers

## 2020-08-31 NOTE — Progress Notes (Signed)
Called and spoke with patient, advised of cxr results per Dr. Annamaria Boots.  He verbalized understanding.  Nothing further needed.

## 2020-09-24 ENCOUNTER — Other Ambulatory Visit: Payer: Self-pay | Admitting: Cardiology

## 2020-10-26 ENCOUNTER — Other Ambulatory Visit: Payer: Self-pay

## 2020-10-26 ENCOUNTER — Ambulatory Visit: Payer: Medicare Other

## 2020-10-26 DIAGNOSIS — G4733 Obstructive sleep apnea (adult) (pediatric): Secondary | ICD-10-CM

## 2020-12-07 ENCOUNTER — Telehealth: Payer: Self-pay | Admitting: Internal Medicine

## 2020-12-07 DIAGNOSIS — G4733 Obstructive sleep apnea (adult) (pediatric): Secondary | ICD-10-CM

## 2020-12-07 NOTE — Telephone Encounter (Signed)
Home sleep test showed severe sleep apnea, averaging 76 apneas/ hour.  Recommend- order DME [facility]- please replace old CPAP machine, auto 10-20, mask of choice, humidifier, supplies, AirView/ card  He will need to see me in 31-90 days please for CPAP f/u

## 2020-12-07 NOTE — Telephone Encounter (Signed)
Dr. Maple Hudson please advise on Home Sleep Test results.  Thanks!

## 2020-12-07 NOTE — Telephone Encounter (Signed)
LMTCB for pt 

## 2020-12-08 NOTE — Telephone Encounter (Signed)
Called and spoke with patient, advised of results and recommendations per Dr. Maple Hudson.  He verbalized understanding.  He did stat that he had discussed the inspire device with Dr. Maple Hudson and would prefer to have that device rather than the CPAP.  Dr. Maple Hudson, please advise.  Thank you.

## 2020-12-08 NOTE — Telephone Encounter (Signed)
We Darylene Price certainly refer him to Dr Christia Reading or Dr Osborn Coho to discuss Inspire therapy for sleep apnea. I think he is outside of the range for that, but see what they say. Otherwise, CPAP will still be the best treatment, as we recently ordered.

## 2020-12-08 NOTE — Telephone Encounter (Signed)
Patient is returning phone call. Patient phone number is [phone].

## 2020-12-09 NOTE — Telephone Encounter (Signed)
Left detailed vm per dpr on home #.  Called cell # and left a vm to return call.  Will await return call.

## 2020-12-09 NOTE — Telephone Encounter (Signed)
Patient is returning phone call. Patient phone number is [phone]. °

## 2020-12-09 NOTE — Telephone Encounter (Signed)
Pt is returning phone call from office . Please advise

## 2020-12-09 NOTE — Telephone Encounter (Signed)
Called and spoke with pt letting him know the info stated by CY and he verbalized understanding. Referral has been placed. Nothing further needed.

## 2021-03-10 ENCOUNTER — Telehealth: Payer: Self-pay

## 2021-03-10 NOTE — Telephone Encounter (Signed)
   [facility] Pre-operative Risk Assessment    HEARTCARE STAFF: - Please ensure there is not already an duplicate clearance open for this procedure. - Under Visit Info/Reason for Call, type in Other and utilize the format Clearance MM/DD/YY or Clearance TBD. Do not use dashes or single digits. - If request is for dental extraction, please clarify the # of teeth to be extracted.  Request for surgical clearance:  1. What type of surgery is being performed? Knee Arthroscopy   2. When is this surgery scheduled? 04/09/2021  3. What type of clearance is required (medical clearance vs. Pharmacy clearance to hold med vs. Both)?    Medical / Cardiac  4. Are there any medications that need to be held prior to surgery and how long? None listed   5. Practice name and name of physician performing surgery? [facility]  6. What is the office phone number? [phone]   7.   What is the office fax number? [phone]  8.   Anesthesia type (None, local, MAC, general) ? Choice   Toni Arthurs 03/10/2021, 4:40 PM  _________________________________________________________________   (provider comments below)

## 2021-03-11 NOTE — Telephone Encounter (Signed)
Pt agreeable to pre op appt needed. Pt has been scheduled to see Berniece Salines, DO 03/29/21 @ 8:20 am. Pt thanked me for the call and the help. I will send clearance notes to DO for upcoming appt. Will send FYI to requesting office pt has appt 03/29/21.

## 2021-03-11 NOTE — Telephone Encounter (Signed)
Primary Cardiologist:Brian Bettina Gavia, MD  Chart reviewed as part of pre-operative protocol coverage. Because of Lawrence Medina's past medical history and time since last visit, he/she will require a follow-up visit in order to better assess preoperative cardiovascular risk.  Pre-op covering staff: - Please schedule appointment and call patient to inform them. - Please contact requesting surgeon's office via preferred method (i.e, phone, fax) to inform them of need for appointment prior to surgery.  If applicable, this message will also be routed to pharmacy pool and/or primary cardiologist for input on holding anticoagulant/antiplatelet agent as requested below so that this information is available at time of patient's appointment.   Deberah Pelton, NP  03/11/2021, 7:43 AM

## 2021-03-25 NOTE — Progress Notes (Signed)
Sent message, via epic in basket, requesting orders in epic from surgeon.  

## 2021-03-29 ENCOUNTER — Encounter: Payer: Self-pay | Admitting: Cardiology

## 2021-03-29 ENCOUNTER — Ambulatory Visit (INDEPENDENT_AMBULATORY_CARE_PROVIDER_SITE_OTHER): Payer: Medicare Other | Admitting: Cardiology

## 2021-03-29 ENCOUNTER — Other Ambulatory Visit: Payer: Self-pay

## 2021-03-29 VITALS — BP 108/78 | HR 87 | Ht 72.0 in | Wt 353.0 lb

## 2021-03-29 DIAGNOSIS — I110 Hypertensive heart disease with heart failure: Secondary | ICD-10-CM

## 2021-03-29 DIAGNOSIS — Z6841 Body Mass Index (BMI) 40.0 and over, adult: Secondary | ICD-10-CM

## 2021-03-29 DIAGNOSIS — F17200 Nicotine dependence, unspecified, uncomplicated: Secondary | ICD-10-CM

## 2021-03-29 DIAGNOSIS — G4733 Obstructive sleep apnea (adult) (pediatric): Secondary | ICD-10-CM | POA: Diagnosis not present

## 2021-03-29 NOTE — Patient Instructions (Signed)

## 2021-03-29 NOTE — Progress Notes (Signed)
Cardiology Office Note:    Date:  03/29/2021   ID:  Lawrence Medina, DOB [birth year], MRN [id number]  PCP:  Leonard Downing, MD  Cardiologist:  Shirlee More, MD  Electrophysiologist:  None   Referring MD: Leonard Downing, *   I am doing fine  History of Present Illness:    Lawrence Medina is a 55 y.o. 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  He was last seen 06/27/2019.  Sleep test showed what was described as very severe obstructive sleep apnea ranging over 90 apneic episodes per hour very low oxygen scores and was prescribed CPAP.  The patient follows with Dr. Bettina Gavia and was last seen by him on 09/10/2019. At that time he appeared to be stable from a CV standpoint.   He is here today for a follow up visit and pending knee surgery.  Patient will follow complaints at this time today.  He tells me that he is doing his activity without any limitations.  Past Medical History:  Diagnosis Date  . Allergic rhinitis   . Arthritis   . Asthma   . Complication of anesthesia   . COPD (chronic obstructive pulmonary disease) ([place])   . DDD (degenerative disc disease), lumbar   . GERD (gastroesophageal reflux disease)   . Headache   . High cholesterol   . Hypertension   . OSA (obstructive sleep apnea)   . PONV (postoperative nausea and vomiting)   . Seizures ([place]) 1982   x1 in 1982- unknown cause- none since  . Shortness of breath dyspnea    with exertion    Past Surgical History:  Procedure Laterality Date  . HERNIA  REPAIR    . SHOULDER ARTHROSCOPY WITH ROTATOR CUFF REPAIR Left 02/23/2015   Procedure: SHOULDER ARTHROSCOPY WITH ROTATOR CUFF REPAIR BIOTENDONESIS REPAIR;  Surgeon: Marybelle Killings, MD;  Location: [place];  Service: Orthopedics;  Laterality: Left;  . SUBMANDIBULAR GLAND EXCISION Left 1975  . TOTAL KNEE ARTHROPLASTY Left 1986    Current Medications: Current Meds  Medication Sig  . albuterol (VENTOLIN HFA) 108 (90 Base) MCG/ACT inhaler Inhale 2 puffs, then rinse mouth, twice daily- maintenance  . diclofenac Sodium (VOLTAREN) 1 % GEL Apply 1 application topically 2 (two) times daily as needed (pain).  . fluticasone (FLONASE) 50 MCG/ACT nasal spray Place 1 spray into both nostrils daily.  Marland Kitchen lovastatin (MEVACOR) 10 MG tablet Take 10 mg by mouth at bedtime.  . nitroGLYCERIN (NITROSTAT) 0.4 MG SL tablet PLACE 1 TABLET UNDER THE TONGUE EVERY 5 MINUTES AS NEEDED FOR CHEST PAIN UP TO 3 DOSES, IF SYMPTOMS PERSIST CALL 911  . omeprazole (PRILOSEC) 40 MG capsule Take 40 mg by mouth daily as needed (acid reflux).  Marland Kitchen oxyCODONE-acetaminophen (PERCOCET/ROXICET) 5-325 MG tablet Take 1 tablet by mouth every 6 (six) hours.  Marland Kitchen rOPINIRole (REQUIP) 0.5 MG tablet Take 0.5-3 mg by mouth at bedtime as needed (RLS).  . Tiotropium Bromide Monohydrate (SPIRIVA RESPIMAT) 2.5 MCG/ACT AERS Inhale 2 puffs into the lungs daily.  Marland Kitchen torsemide (DEMADEX) 20 MG tablet Take  20 mg by mouth 2 (two) times daily.     Allergies:   Codeine   Social History   Socioeconomic History  . Marital status: Married    Spouse name: Not on file  . Number of children: Not on file  . Years of education: Not on file  . Highest education level: Not on file  Occupational History  . Not on file  Tobacco Use  . Smoking status: Current Every Day Smoker    Packs/day: 0.25    Years: 15.00    Pack years: 3.75    Types: Cigarettes  . Smokeless tobacco: Never Used  . Tobacco comment: 10 per day   Vaping Use  . Vaping Use: Never used  Substance and  Sexual Activity  . Alcohol use: Yes    Alcohol/week: 2.0 standard drinks    Types: 2 Cans of beer per week    Comment: 2 beers per week   . Drug use: Yes    Types: Marijuana  . Sexual activity: Yes  Other Topics Concern  . Not on file  Social History Narrative   ** Merged History Encounter **       Social Determinants of Health   Financial Resource Strain: Not on file  Food Insecurity: Not on file  Transportation Needs: Not on file  Physical Activity: Not on file  Stress: Not on file  Social Connections: Not on file     Family History: The patient's family history includes Hypertension in his mother; Lung cancer in his father; Skin cancer in his mother; Sleep apnea in his mother.  ROS:   Review of Systems  Constitution: Negative for decreased appetite, fever and weight gain.  HENT: Negative for congestion, ear discharge, hoarse voice and sore throat.   Eyes: Negative for discharge, redness, vision loss in right eye and visual halos.  Cardiovascular: Negative for chest pain, dyspnea on exertion, leg swelling, orthopnea and palpitations.  Respiratory: Negative for cough, hemoptysis, shortness of breath and snoring.   Endocrine: Negative for heat intolerance and polyphagia.  Hematologic/Lymphatic: Negative for bleeding problem. Does not bruise/bleed easily.  Skin: Negative for flushing, nail changes, rash and suspicious lesions.  Musculoskeletal: Negative for arthritis, joint pain, muscle cramps, myalgias, neck pain and stiffness.  Gastrointestinal: Negative for abdominal pain, bowel incontinence, diarrhea and excessive appetite.  Genitourinary: Negative for decreased libido, genital sores and incomplete emptying.  Neurological: Negative for brief paralysis, focal weakness, headaches and loss of balance.  Psychiatric/Behavioral: Negative for altered mental status, depression and suicidal ideas.  Allergic/Immunologic: Negative for HIV exposure and persistent infections.     EKGs/Labs/Other Studies Reviewed:    The following studies were reviewed today:   EKG:  The ekg ordered today demonstrates sinus rhythm, heart rate 87 bpm with underlying right bundle branch block.  Compared to EKG done in July 2020 at which time the patient did possibly have a transvenous pacer as he had a atrial paced rhythm and occasional PVCs.  Transthoracic echocardiogram July 2020 IMPRESSIONS    1. The left ventricle has normal systolic function with an ejection  fraction of 60-65%. The cavity size was normal. There is moderate  concentric left ventricular hypertrophy. Left ventricular diastolic  Doppler parameters are consistent with impaired  relaxation.  2. The right ventricle has normal systolic function. The cavity was  normal. There is no increase in right ventricular wall thickness.  3. The tricuspid valve is grossly normal.  4. The aortic valve is tricuspid. Aortic valve regurgitation was not  assessed by color flow Doppler.   FINDINGS  Left Ventricle: The left ventricle has normal systolic function, with an  ejection fraction of 60-65%. The cavity size was normal. There is moderate  concentric left ventricular hypertrophy. Left ventricular diastolic  Doppler parameters are consistent  with impaired relaxation. Normal left ventricular filling pressures   Right Ventricle: The right ventricle has normal systolic function. The  cavity was normal. There is no increase in right ventricular wall  thickness.   Left Atrium: Left atrial size was normal in size.   Right Atrium: Right atrial size was normal in size. Right atrial pressure  is estimated at 8 mmHg.   Interatrial Septum: No atrial level shunt detected by color flow Doppler.   Pericardium: There is no evidence of pericardial effusion.   Mitral Valve: The mitral valve is normal in structure. Mitral valve  regurgitation was not assessed by color flow Doppler.   Tricuspid Valve: The tricuspid valve  is grossly normal. Tricuspid valve  regurgitation is mild by color flow Doppler.   Aortic Valve: The aortic valve is tricuspid Aortic valve regurgitation was  not assessed by color flow Doppler.   Pulmonic Valve: The pulmonic valve was normal in structure. Pulmonic valve  regurgitation was not assessed by color flow Doppler.   Venous: The inferior vena cava is normal in size with greater than 50%  respiratory variability.      Recent Labs: No results found for requested labs within last 8760 hours.  Recent Lipid Panel    Component Value Date/Time   CHOL 207 (H) 06/14/2019 1926   TRIG 147 06/16/2019 0931   HDL 35 (L) 06/14/2019 1926   CHOLHDL 5.9 06/14/2019 1926   VLDL 29 06/14/2019 1926   LDLCALC 143 (H) 06/14/2019 1926    Physical Exam:    VS:  BP 108/78 (BP Location: Right Arm, Patient Position: Sitting, Cuff Size: Large)   Pulse 87   Ht 6' (1.829 m)   Wt (!) 353 lb (160.1 kg)   SpO2 92%   BMI 47.88 kg/m     Wt Readings from Last 3 Encounters:  03/29/21 (!) 353 lb (160.1 kg)  08/28/20 (!) 359 lb 6.4 oz (163 kg)  05/07/20 (!) 379 lb (171.9 kg)     GEN: Well nourished, well developed in no acute distress HEENT: Normal NECK: No JVD; No carotid bruits LYMPHATICS: No lymphadenopathy CARDIAC: S1S2 noted,RRR, no murmurs, rubs, gallops RESPIRATORY:  Clear to auscultation without rales, wheezing or rhonchi  ABDOMEN: Soft, non-tender, non-distended, +bowel sounds, no guarding. EXTREMITIES: No edema, No cyanosis, no clubbing MUSCULOSKELETAL:  No deformity  SKIN: Warm and dry NEUROLOGIC:  Alert and oriented x 3, non-focal PSYCHIATRIC:  Normal affect, good insight  ASSESSMENT:    1. Hypertensive heart disease with heart failure ([place])   2. OSA (obstructive sleep apnea)   3. Body mass index (BMI) of 45.0-49.9 in adult ([place])   4. Current every day smoker    PLAN:     He appears to be doing well from a cardiovascular standpoint.  He is at his baseline.  Denies any  chest pain, any worsening shortness of breath.  He tells me he is very active with his day-to-day activities he does gardening and take care of his animals. The patient does not have any unstable cardiac conditions.  Upon evaluation today, he can achieve 4 METs or greater without anginal symptoms.  According to [facility] and AHA guidelines, he requires no further cardiac workup prior to his  noncardiac surgery and should be at acceptable risk.  Our service is available as necessary in the perioperative period.  His blood pressure is at target no changes will be made to his at hypertensive medication.  Continue with CPAP.  Smoking cessation advised  The patient understands the need to lose weight with diet and exercise. We have discussed specific strategies for this.  The patient is in agreement with the above plan. The patient left the office in stable condition.  The patient will follow up in 3 months with Dr. Bettina Gavia   Medication Adjustments/Labs and Tests Ordered: Current medicines are reviewed at length with the patient today.  Concerns regarding medicines are outlined above.  Orders Placed This Encounter  Procedures  . EKG 12-Lead   No orders of the defined types were placed in this encounter.   Patient Instructions  Medication Instructions:  Your physician recommends that you continue on your current medications as directed. Please refer to the Current Medication list given to you today.  *If you need a refill on your cardiac medications before your next appointment, please call your pharmacy*   Lab Work: None If you have labs (blood work) drawn today and your tests are completely normal, you will receive your results only by: Marland Kitchen MyChart Message (if you have MyChart) OR . A paper copy in the mail If you have any lab test that is abnormal or we need to change your treatment, we will call you to review the results.   Testing/Procedures: None   Follow-Up: At [facility], you  and your health needs are our priority.  As part of our continuing mission to provide you with exceptional heart care, we have created designated Provider Care Teams.  These Care Teams include your primary Cardiologist (physician) and Advanced Practice Providers (APPs -  Physician Assistants and Nurse Practitioners) who all work together to provide you with the care you need, when you need it.  We recommend signing up for the patient portal called "MyChart".  Sign up information is provided on this After Visit Summary.  MyChart is used to connect with patients for Virtual Visits (Telemedicine).  Patients are able to view lab/test results, encounter notes, upcoming appointments, etc.  Non-urgent messages can be sent to your provider as well.   To learn more about what you can do with MyChart, go to [email or web address].    Your next appointment:   3 month(s)  The format for your next appointment:   In Person  Provider:   Berniece Salines, DO   Other Instructions      Adopting a Healthy Lifestyle.  Know what a healthy weight is for you (roughly BMI <25) and aim to maintain this   Aim for 7+ servings of fruits and vegetables daily   65-80+ fluid ounces of water or unsweet tea for healthy kidneys   Limit to max 1 drink of alcohol per day; avoid smoking/tobacco   Limit animal fats in diet for cholesterol and heart health - choose grass fed whenever available   Avoid highly processed foods, and foods high in saturated/trans fats   Aim for low stress - take time to unwind and care for your mental health   Aim for 150 min of moderate intensity exercise weekly for heart health, and weights twice weekly for bone health   Aim for 7-9 hours of sleep daily   When it comes to diets, agreement about the perfect plan isnt easy to find, even among the experts. Experts at  the [employer] developed an idea known as the Healthy Eating Plate. Just imagine a plate divided into  logical, healthy portions.   The emphasis is on diet quality:   Load up on vegetables and fruits - one-half of your plate: Aim for color and variety, and remember that potatoes dont count.   Go for whole grains - one-quarter of your plate: Whole wheat, barley, wheat berries, quinoa, oats, brown rice, and foods made with them. If you want pasta, go with whole wheat pasta.   Protein power - one-quarter of your plate: Fish, chicken, beans, and nuts are all healthy, versatile protein sources. Limit red meat.   The diet, however, does go beyond the plate, offering a few other suggestions.   Use healthy plant oils, such as olive, canola, soy, corn, sunflower and peanut. Check the labels, and avoid partially hydrogenated oil, which have unhealthy trans fats.   If youre thirsty, drink water. Coffee and tea are good in moderation, but skip sugary drinks and limit milk and dairy products to one or two daily servings.   The type of carbohydrate in the diet is more important than the amount. Some sources of carbohydrates, such as vegetables, fruits, whole grains, and beans-are healthier than others.   Finally, stay active  Signed, Berniece Salines, DO  03/29/2021 9:27 AM    [facility]

## 2021-03-29 NOTE — H&P (Signed)
Patient's anticipated LOS is less than 2 midnights, meeting these requirements: - Younger than 36 - Lives within 1 hour of care - Has a competent adult at home to recover with post-op recover - NO history of  - Chronic pain requiring opiods  - Diabetes  - Coronary Artery Disease  - Heart failure  - Heart attack  - Stroke  - DVT/VTE  - Cardiac arrhythmia  - Respiratory Failure/COPD  - Renal failure  - Anemia  - Advanced Liver disease       Lawrence Medina is an 55 y.o. male.    Chief Complaint: right knee pain  HPI: Pt is a 55 y.o. male complaining of right knee pain for multiple months. Pain had continually increased since the beginning. X-rays in the clinic show meniscal tear right knee. Pt has tried various conservative treatments which have failed to alleviate their symptoms, including injections and therapy. Various options are discussed with the patient. Risks, benefits and expectations were discussed with the patient. Patient understand the risks, benefits and expectations and wishes to proceed with surgery.   PCP:  Leonard Downing, MD  D/C Plans: Home  PMH: Past Medical History:  Diagnosis Date  . Allergic rhinitis   . Arthritis   . Asthma   . Complication of anesthesia   . COPD (chronic obstructive pulmonary disease) ([place])   . DDD (degenerative disc disease), lumbar   . GERD (gastroesophageal reflux disease)   . Headache   . High cholesterol   . Hypertension   . OSA (obstructive sleep apnea)   . PONV (postoperative nausea and vomiting)   . Seizures ([place]) 1982   x1 in 1982- unknown cause- none since  . Shortness of breath dyspnea    with exertion    PSH: Past Surgical History:  Procedure Laterality Date  . HERNIA REPAIR    . SHOULDER ARTHROSCOPY WITH ROTATOR CUFF REPAIR Left 02/23/2015   Procedure: SHOULDER ARTHROSCOPY WITH ROTATOR CUFF REPAIR BIOTENDONESIS REPAIR;  Surgeon: Marybelle Killings, MD;  Location: [place];  Service: Orthopedics;  Laterality:  Left;  . SUBMANDIBULAR GLAND EXCISION Left 1975  . TOTAL KNEE ARTHROPLASTY Left 1986    Social History:  reports that he has been smoking cigarettes. He has a 3.75 pack-year smoking history. He has never used smokeless tobacco. He reports current alcohol use of about 2.0 standard drinks of alcohol per week. He reports current drug use. Drug: Marijuana.  Allergies:  Allergies  Allergen Reactions  . Codeine Itching    Medications: No current facility-administered medications for this encounter.   Current Outpatient Medications  Medication Sig Dispense Refill  . diclofenac Sodium (VOLTAREN) 1 % GEL Apply 1 application topically 2 (two) times daily as needed (pain).    . fluticasone (FLONASE) 50 MCG/ACT nasal spray Place 1 spray into both nostrils daily.    Marland Kitchen omeprazole (PRILOSEC) 40 MG capsule Take 40 mg by mouth daily as needed (acid reflux).    Marland Kitchen rOPINIRole (REQUIP) 0.5 MG tablet Take 0.5-3 mg by mouth at bedtime as needed (RLS).    . Tiotropium Bromide Monohydrate (SPIRIVA RESPIMAT) 2.5 MCG/ACT AERS Inhale 2 puffs into the lungs daily. 4 g 12  . torsemide (DEMADEX) 20 MG tablet Take 20 mg by mouth 2 (two) times daily.    Marland Kitchen albuterol (VENTOLIN HFA) 108 (90 Base) MCG/ACT inhaler Inhale 1 puff into the lungs every 6 (six) hours as needed for wheezing or shortness of breath.    . lovastatin (MEVACOR) 10 MG  tablet Take 10 mg by mouth at bedtime.    . nitroGLYCERIN (NITROSTAT) 0.4 MG SL tablet Place 0.4 mg under the tongue every 5 (five) minutes as needed for chest pain.    Marland Kitchen oxyCODONE-acetaminophen (PERCOCET/ROXICET) 5-325 MG tablet Take 1 tablet by mouth every 6 (six) hours.      No results found for this or any previous visit (from the past 48 hour(s)). No results found.  ROS: Pain with rom of the right lower extremity  Physical Exam: Alert and oriented 55 y.o. male in no acute distress Cranial nerves 2-12 intact Cervical spine: full rom with no tenderness, nv intact  distally Chest: active breath sounds bilaterally, no wheeze rhonchi or rales Heart: regular rate and rhythm, no murmur Abd: non tender non distended with active bowel sounds Hip is stable with rom  Right knee medial joint line tenderness Antalgic gait No rashes or edema  Assessment/Plan Assessment: right knee meniscal tear  Plan:  Patient will undergo a right knee arthroscopy by Dr. Veverly Fells at [facility] benefits and expectations were discussed with the patient. Patient understand risks, benefits and expectations and wishes to proceed. Preoperative templating of the joint replacement has been completed, documented, and submitted to the Operating Room personnel in order to optimize intra-operative equipment management.   Merla Riches PA-C, MPAS [facility] is now [facility] [address], [place], [place], [place] [address] Phone: [phone] [email or web address]  [employer]

## 2021-04-01 NOTE — Patient Instructions (Addendum)
DUE TO COVID-19 ONLY ONE VISITOR IS ALLOWED TO COME WITH YOU AND STAY IN THE WAITING ROOM ONLY DURING PRE OP AND PROCEDURE DAY OF SURGERY. THE 1 VISITOR  [place]ott VISIT WITH YOU AFTER SURGERY IN YOUR PRIVATE ROOM DURING VISITING HOURS ONLY!  YOU NEED TO HAVE A COVID 19 TEST ON: 04/06/21 @ 8:30 AM, THIS TEST MUST BE DONE BEFORE SURGERY,  COVID TESTING SITE [place] [address], IT IS ON THE RIGHT GOING OUT [address] APPROXIMATELY  2 MINUTES PAST [employer] ON THE RIGHT. ONCE YOUR COVID TEST IS COMPLETED,  PLEASE BEGIN THE QUARANTINE INSTRUCTIONS AS OUTLINED IN YOUR HANDOUT.                Lawrence Medina    Your procedure is scheduled on: 04/09/21   Report to [facility] Main  Entrance   Report to admitting at: 1:00 PM     Call this number if you have problems the morning of surgery [phone]    Remember: NO SOLID FOOD AFTER MIDNIGHT THE NIGHT PRIOR TO SURGERY. NOTHING BY MOUTH EXCEPT CLEAR LIQUIDS UNTIL: 12:00 PM . PLEASE FINISH ENSURE DRINK PER SURGEON ORDER  WHICH NEEDS TO BE COMPLETED AT: 12:00 PM .  CLEAR LIQUID DIET  Foods Allowed                                                                     Foods Excluded  Coffee and tea, regular and decaf                             liquids that you cannot  Plain Jell-O any favor except red or purple                                           see through such as: Fruit ices (not with fruit pulp)                                     milk, soups, orange juice  Iced Popsicles                                    All solid food Carbonated beverages, regular and diet                                    Cranberry, grape and apple juices Sports drinks like Gatorade Lightly seasoned clear broth or consume(fat free) Sugar, honey syrup  Sample Menu Breakfast                                Lunch  Supper Cranberry juice                    Beef broth                             Chicken broth Jell-O                                     Grape juice                           Apple juice Coffee or tea                        Jell-O                                      Popsicle                                                Coffee or tea                        Coffee or tea  _____________________________________________________________________   BRUSH YOUR TEETH MORNING OF SURGERY AND RINSE YOUR MOUTH OUT, NO CHEWING GUM CANDY OR MINTS.    Take these medicines the morning of surgery with A SIP OF WATER: omeprazole.Use inhalers and flonase as usual.                               You Achord not have any metal on your body including hair pins and              piercings  Do not wear jewelry, lotions, powders or perfumes, deodorant             Men Arciga shave face and neck.   Do not bring valuables to the hospital. [place].  Contacts, dentures or bridgework Math not be worn into surgery.  Leave suitcase in the car. After surgery it Sova be brought to your room.     Patients discharged the day of surgery will not be allowed to drive home. IF YOU ARE HAVING SURGERY AND GOING HOME THE SAME DAY, YOU MUST HAVE AN ADULT TO DRIVE YOU HOME AND BE WITH YOU FOR 24 HOURS. YOU Ralston GO HOME BY TAXI OR UBER OR ORTHERWISE, BUT AN ADULT MUST ACCOMPANY YOU HOME AND STAY WITH YOU FOR 24 HOURS.  Name and phone number of your driver:  Special Instructions: N/A              Please read over the following fact sheets you were given: _____________________________________________________________________          [facility] - Preparing for Surgery Before surgery, you can play an important role.  Because skin is not sterile, your skin needs to be as free of germs as possible.  You can reduce the number of germs on your skin  by washing with CHG (chlorahexidine gluconate) soap before surgery.  CHG is an antiseptic cleaner which kills germs and bonds with  the skin to continue killing germs even after washing. Please DO NOT use if you have an allergy to CHG or antibacterial soaps.  If your skin becomes reddened/irritated stop using the CHG and inform your nurse when you arrive at Short Stay. Do not shave (including legs and underarms) for at least 48 hours prior to the first CHG shower.  You Vester shave your face/neck. Please follow these instructions carefully:  1.  Shower with CHG Soap the night before surgery and the  morning of Surgery.  2.  If you choose to wash your hair, wash your hair first as usual with your  normal  shampoo.  3.  After you shampoo, rinse your hair and body thoroughly to remove the  shampoo.                           4.  Use CHG as you would any other liquid soap.  You can apply chg directly  to the skin and wash                       Gently with a scrungie or clean washcloth.  5.  Apply the CHG Soap to your body ONLY FROM THE NECK DOWN.   Do not use on face/ open                           Wound or open sores. Avoid contact with eyes, ears mouth and genitals (private parts).                       Wash face,  Genitals (private parts) with your normal soap.             6.  Wash thoroughly, paying special attention to the area where your surgery  will be performed.  7.  Thoroughly rinse your body with warm water from the neck down.  8.  DO NOT shower/wash with your normal soap after using and rinsing off  the CHG Soap.                9.  Pat yourself dry with a clean towel.            10.  Wear clean pajamas.            11.  Place clean sheets on your bed the night of your first shower and do not  sleep with pets. Day of Surgery : Do not apply any lotions/deodorants the morning of surgery.  Please wear clean clothes to the hospital/surgery center.  FAILURE TO FOLLOW THESE INSTRUCTIONS Miyamoto RESULT IN THE CANCELLATION OF YOUR SURGERY PATIENT SIGNATURE_________________________________  NURSE  SIGNATURE__________________________________  ________________________________________________________________________   Lawrence Medina  An incentive spirometer is a tool that can help keep your lungs clear and active. This tool measures how well you are filling your lungs with each breath. Taking long deep breaths [place] help reverse or decrease the chance of developing breathing (pulmonary) problems (especially infection) following:  A long period of time when you are unable to move or be active. BEFORE THE PROCEDURE   If the spirometer includes an indicator to show your best effort, your nurse or respiratory therapist will set it to a desired goal.  If possible, sit  up straight or lean slightly forward. Try not to slouch.  Hold the incentive spirometer in an upright position. INSTRUCTIONS FOR USE  1. Sit on the edge of your bed if possible, or sit up as far as you can in bed or on a chair. 2. Hold the incentive spirometer in an upright position. 3. Breathe out normally. 4. Place the mouthpiece in your mouth and seal your lips tightly around it. 5. Breathe in slowly and as deeply as possible, raising the piston or the ball toward the top of the column. 6. Hold your breath for 3-5 seconds or for as long as possible. Allow the piston or ball to fall to the bottom of the column. 7. Remove the mouthpiece from your mouth and breathe out normally. 8. Rest for a few seconds and repeat Steps 1 through 7 at least 10 times every 1-2 hours when you are awake. Take your time and take a few normal breaths between deep breaths. 9. The spirometer [place] include an indicator to show your best effort. Use the indicator as a goal to work toward during each repetition. 10. After each set of 10 deep breaths, practice coughing to be sure your lungs are clear. If you have an incision (the cut made at the time of surgery), support your incision when coughing by placing a pillow or rolled up towels firmly  against it. Once you are able to get out of bed, walk around indoors and cough well. You Gallery stop using the incentive spirometer when instructed by your caregiver.  RISKS AND COMPLICATIONS  Take your time so you do not get dizzy or light-headed.  If you are in pain, you Rogan need to take or ask for pain medication before doing incentive spirometry. It is harder to take a deep breath if you are having pain. AFTER USE  Rest and breathe slowly and easily.  It can be helpful to keep track of a log of your progress. Your caregiver can provide you with a simple table to help with this. If you are using the spirometer at home, follow these instructions: [place] IF:   You are having difficultly using the spirometer.  You have trouble using the spirometer as often as instructed.  Your pain medication is not giving enough relief while using the spirometer.  You develop fever of 100.5 F (38.1 C) or higher. SEEK IMMEDIATE MEDICAL CARE IF:   You cough up bloody sputum that had not been present before.  You develop fever of 102 F (38.9 C) or greater.  You develop worsening pain at or near the incision site. MAKE SURE YOU:   Understand these instructions.  Will watch your condition.  Will get help right away if you are not doing well or get worse. Document Released: 04/10/2007 Document Revised: 02/20/2012 Document Reviewed: 06/11/2007 [facility] Patient Information 2014 [place], [place].   ________________________________________________________________________

## 2021-04-02 ENCOUNTER — Other Ambulatory Visit: Payer: Self-pay

## 2021-04-02 ENCOUNTER — Encounter (HOSPITAL_COMMUNITY): Payer: Self-pay

## 2021-04-02 ENCOUNTER — Encounter (HOSPITAL_COMMUNITY)
Admission: RE | Admit: 2021-04-02 | Discharge: 2021-04-02 | Disposition: A | Discharge status: Home / Self Care | Payer: Medicare Other | Source: Ambulatory Visit | Attending: Orthopedic Surgery | Admitting: Orthopedic Surgery

## 2021-04-02 DIAGNOSIS — Z01812 Encounter for preprocedural laboratory examination: Secondary | ICD-10-CM | POA: Diagnosis present

## 2021-04-02 HISTORY — DX: Pneumonia, unspecified organism: J18.9

## 2021-04-02 HISTORY — DX: Acute myocardial infarction, unspecified: I21.9

## 2021-04-02 HISTORY — DX: Malignant (primary) neoplasm, unspecified: C80.1

## 2021-04-02 LAB — BASIC METABOLIC PANEL
Anion gap: 7 (ref 5–15)
BUN: 12 mg/dL (ref 6–20)
CO2: 29 mmol/L (ref 22–32)
Calcium: 9 mg/dL (ref 8.9–10.3)
Chloride: 104 mmol/L (ref 98–111)
Creatinine, Ser: 0.94 mg/dL (ref 0.61–1.24)
GFR, Estimated: >60 mL/min (ref >60)
Glucose, Bld: 107 mg/dL — ABNORMAL HIGH (ref 70–99)
Potassium: 3.9 mmol/L (ref 3.5–5.1)
Sodium: 140 mmol/L (ref 135–145)

## 2021-04-02 LAB — CBC
HCT: 47.3 % (ref 39.0–52.0)
Hemoglobin: 14.9 g/dL (ref 13.0–17.0)
MCH: 27.9 pg (ref 26.0–34.0)
MCHC: 31.5 g/dL (ref 30.0–36.0)
MCV: 88.4 fL (ref 80.0–100.0)
Platelets: 224 10*3/uL (ref 150–400)
RBC: 5.35 MIL/uL (ref 4.22–5.81)
RDW: 16.1 % — ABNORMAL HIGH (ref 11.5–15.5)
WBC: 7.6 10*3/uL (ref 4.0–10.5)
nRBC: 0 % (ref 0.0–0.2)

## 2021-04-02 NOTE — Progress Notes (Signed)
COVID Vaccine Completed: Yes Date COVID Vaccine completed: 06/27/20 COVID vaccine manufacturer:  [employer]   PCP - Dr. Herbert Moors Cardiologist - Dr. Nickie Retort. LOV: 03/29/21. Clearance: Kardie Tobb: DO: 03/29/21: EPIC  Chest x-ray - 08/28/20 EKG - 03/29/21 Stress Test -  ECHO - 06/15/19 Cardiac Cath -  Pacemaker/ICD device last checked:  Sleep Study - Yes CPAP - Yes  Fasting Blood Sugar -  Checks Blood Sugar _____ times a day  Blood Thinner Instructions: Aspirin Instructions: Last Dose:  Anesthesia review: Hx: COPD,Smoker,HTN,OSA(CPAP),SOB(with exertion),Cardiac arrest(2020).  Patient denies shortness of breath, fever, cough and chest pain at PAT appointment   Patient verbalized understanding of instructions that were given to them at the PAT appointment. Patient was also instructed that they will need to review over the PAT instructions again at home before surgery.

## 2021-04-05 NOTE — Anesthesia Preprocedure Evaluation (Addendum)
Anesthesia Evaluation  Patient identified by MRN, date of birth, ID band Patient awake    Reviewed: Allergy & Precautions, NPO status , Patient's Chart, lab work & pertinent test results  History of Anesthesia Complications (+) PONV and history of anesthetic complications  Airway Mallampati: IV  TM Distance: <3 FB Neck ROM: Full  Mouth opening: Limited Mouth Opening  Dental  (+) Dental Advisory Given, Teeth Intact   Pulmonary shortness of breath, asthma , sleep apnea , COPD, Current SmokerPatient did not abstain from smoking.,    breath sounds clear to auscultation       Cardiovascular hypertension, + Past MI   Rhythm:Regular  1. The left ventricle has normal systolic function with an ejection  fraction of 60-65%. The cavity size was normal. There is moderate  concentric left ventricular hypertrophy. Left ventricular diastolic  Doppler parameters are consistent with impaired  relaxation.  2. The right ventricle has normal systolic function. The cavity was  normal. There is no increase in right ventricular wall thickness.  3. The tricuspid valve is grossly normal.  4. The aortic valve is tricuspid. Aortic valve regurgitation was not  assessed by color flow Doppler.    Neuro/Psych  Headaches, neg Seizures negative psych ROS   GI/Hepatic Neg liver ROS, GERD  Medicated and Controlled,  Endo/Other  Morbid obesity  Renal/GU negative Renal ROSLab Results      Component                Value               Date                      CREATININE               0.94                04/02/2021                Musculoskeletal  (+) Arthritis , Right knee medial meniscus tear   Abdominal   Peds  Hematology negative hematology ROS (+) Lab Results      Component                Value               Date                      WBC                      7.6                 04/02/2021                HGB                      14.9                 04/02/2021                HCT                      47.3                04/02/2021                MCV  88.4                04/02/2021                PLT                      224                 04/02/2021              Anesthesia Other Findings Mobile mass left below angle of mandible  Reproductive/Obstetrics                            Anesthesia Physical Anesthesia Plan  ASA: IV  Anesthesia Plan: General   Post-op Pain Management:    Induction: Intravenous  PONV Risk Score and Plan: 2 and Ondansetron and Dexamethasone  Airway Management Planned: LMA  Additional Equipment: None  Intra-op Plan:   Post-operative Plan:   Informed Consent: I have reviewed the patients History and Physical, chart, labs and discussed the procedure including the risks, benefits and alternatives for the proposed anesthesia with the patient or authorized representative who has indicated his/her understanding and acceptance.     Dental advisory given  Plan Discussed with: CRNA and Surgeon  Anesthesia Plan Comments: (Per cardiology note 03/29/2021, "He appears to be doing well from a cardiovascular standpoint.  He is at his baseline.  Denies any chest pain, any worsening shortness of breath.  He tells me he is very active with his day-to-day activities he does gardening and take care of his animals. The patient does not have any unstable cardiac conditions.  Upon evaluation today, he can achieve 4 METs or greater without anginal symptoms.  According to [facility] and AHA guidelines, he requires no further cardiac workup prior to his noncardiac surgery and should be at acceptable risk.  Our service is available as necessary in the perioperative period.")       Anesthesia Quick Evaluation

## 2021-04-06 ENCOUNTER — Other Ambulatory Visit (HOSPITAL_COMMUNITY)
Admission: RE | Admit: 2021-04-06 | Discharge: 2021-04-06 | Disposition: A | Discharge status: Home / Self Care | Payer: Medicare Other | Source: Ambulatory Visit | Attending: Orthopedic Surgery | Admitting: Orthopedic Surgery

## 2021-04-06 DIAGNOSIS — Z01812 Encounter for preprocedural laboratory examination: Secondary | ICD-10-CM | POA: Insufficient documentation

## 2021-04-06 DIAGNOSIS — Z20822 Contact with and (suspected) exposure to covid-19: Secondary | ICD-10-CM | POA: Diagnosis not present

## 2021-04-06 LAB — SARS CORONAVIRUS 2 (TAT 6-24 HRS): SARS Coronavirus 2: NEGATIVE

## 2021-04-09 ENCOUNTER — Ambulatory Visit (HOSPITAL_COMMUNITY): Payer: Medicare Other | Admitting: Physician Assistant

## 2021-04-09 ENCOUNTER — Encounter (HOSPITAL_COMMUNITY): Payer: Self-pay | Admitting: Orthopedic Surgery

## 2021-04-09 ENCOUNTER — Encounter (HOSPITAL_COMMUNITY)
Admission: AD | Disposition: A | Discharge status: Home / Self Care | Payer: Self-pay | Source: Ambulatory Visit | Attending: Orthopedic Surgery

## 2021-04-09 ENCOUNTER — Observation Stay (HOSPITAL_COMMUNITY)
Admission: AD | Admit: 2021-04-09 | Discharge: 2021-04-10 | Disposition: A | Discharge status: Home / Self Care | Payer: Medicare Other | Source: Ambulatory Visit | Attending: Orthopedic Surgery | Admitting: Orthopedic Surgery

## 2021-04-09 ENCOUNTER — Ambulatory Visit (HOSPITAL_COMMUNITY): Payer: Medicare Other | Admitting: Anesthesiology

## 2021-04-09 DIAGNOSIS — F17210 Nicotine dependence, cigarettes, uncomplicated: Secondary | ICD-10-CM | POA: Diagnosis not present

## 2021-04-09 DIAGNOSIS — Z85828 Personal history of other malignant neoplasm of skin: Secondary | ICD-10-CM | POA: Diagnosis not present

## 2021-04-09 DIAGNOSIS — I160 Hypertensive urgency: Secondary | ICD-10-CM | POA: Diagnosis not present

## 2021-04-09 DIAGNOSIS — I10 Essential (primary) hypertension: Secondary | ICD-10-CM | POA: Diagnosis present

## 2021-04-09 DIAGNOSIS — J449 Chronic obstructive pulmonary disease, unspecified: Secondary | ICD-10-CM | POA: Diagnosis present

## 2021-04-09 DIAGNOSIS — Z96652 Presence of left artificial knee joint: Secondary | ICD-10-CM | POA: Diagnosis not present

## 2021-04-09 DIAGNOSIS — Z79899 Other long term (current) drug therapy: Secondary | ICD-10-CM | POA: Insufficient documentation

## 2021-04-09 DIAGNOSIS — S83241A Other tear of medial meniscus, current injury, right knee, initial encounter: Secondary | ICD-10-CM | POA: Diagnosis present

## 2021-04-09 DIAGNOSIS — X58XXXA Exposure to other specified factors, initial encounter: Secondary | ICD-10-CM | POA: Insufficient documentation

## 2021-04-09 DIAGNOSIS — M2241 Chondromalacia patellae, right knee: Secondary | ICD-10-CM | POA: Diagnosis not present

## 2021-04-09 DIAGNOSIS — G4733 Obstructive sleep apnea (adult) (pediatric): Secondary | ICD-10-CM | POA: Diagnosis present

## 2021-04-09 DIAGNOSIS — J45909 Unspecified asthma, uncomplicated: Secondary | ICD-10-CM | POA: Diagnosis not present

## 2021-04-09 DIAGNOSIS — M7121 Synovial cyst of popliteal space [Baker], right knee: Secondary | ICD-10-CM | POA: Insufficient documentation

## 2021-04-09 DIAGNOSIS — M23321 Other meniscus derangements, posterior horn of medial meniscus, right knee: Secondary | ICD-10-CM | POA: Diagnosis present

## 2021-04-09 HISTORY — PX: KNEE ARTHROSCOPY: SHX127

## 2021-04-09 LAB — CBC WITH DIFFERENTIAL/PLATELET
Abs Immature Granulocytes: 0.08 10*3/uL — ABNORMAL HIGH (ref 0.00–0.07)
Basophils Absolute: 0 10*3/uL (ref 0.0–0.1)
Basophils Relative: 0 %
Eosinophils Absolute: 0 10*3/uL (ref 0.0–0.5)
Eosinophils Relative: 0 %
HCT: 47.5 % (ref 39.0–52.0)
Hemoglobin: 15 g/dL (ref 13.0–17.0)
Immature Granulocytes: 1 %
Lymphocytes Relative: 5 %
Lymphs Abs: 0.7 10*3/uL (ref 0.7–4.0)
MCH: 28 pg (ref 26.0–34.0)
MCHC: 31.6 g/dL (ref 30.0–36.0)
MCV: 88.8 fL (ref 80.0–100.0)
Monocytes Absolute: 0.2 10*3/uL (ref 0.1–1.0)
Monocytes Relative: 2 %
Neutro Abs: 12.1 10*3/uL — ABNORMAL HIGH (ref 1.7–7.7)
Neutrophils Relative %: 92 %
Platelets: 237 10*3/uL (ref 150–400)
RBC: 5.35 MIL/uL (ref 4.22–5.81)
RDW: 16.1 % — ABNORMAL HIGH (ref 11.5–15.5)
WBC: 13.1 10*3/uL — ABNORMAL HIGH (ref 4.0–10.5)
nRBC: 0 % (ref 0.0–0.2)

## 2021-04-09 LAB — TROPONIN I (HIGH SENSITIVITY): Troponin I (High Sensitivity): 5 ng/L (ref <18)

## 2021-04-09 LAB — BASIC METABOLIC PANEL
Anion gap: 8 (ref 5–15)
BUN: 14 mg/dL (ref 6–20)
CO2: 27 mmol/L (ref 22–32)
Calcium: 8.7 mg/dL — ABNORMAL LOW (ref 8.9–10.3)
Chloride: 100 mmol/L (ref 98–111)
Creatinine, Ser: 1.25 mg/dL — ABNORMAL HIGH (ref 0.61–1.24)
GFR, Estimated: >60 mL/min (ref >60)
Glucose, Bld: 226 mg/dL — ABNORMAL HIGH (ref 70–99)
Potassium: 4.6 mmol/L (ref 3.5–5.1)
Sodium: 135 mmol/L (ref 135–145)

## 2021-04-09 LAB — URINALYSIS, ROUTINE W REFLEX MICROSCOPIC
Bilirubin Urine: NEGATIVE
Glucose, UA: 150 mg/dL — AB
Hgb urine dipstick: NEGATIVE
Ketones, ur: NEGATIVE mg/dL
Leukocytes,Ua: NEGATIVE
Nitrite: NEGATIVE
Protein, ur: NEGATIVE mg/dL
Specific Gravity, Urine: 1.011 (ref 1.005–1.030)
pH: 6 (ref 5.0–8.0)

## 2021-04-09 LAB — GLUCOSE, CAPILLARY: Glucose-Capillary: 199 mg/dL — ABNORMAL HIGH (ref 70–99)

## 2021-04-09 LAB — MRSA PCR SCREENING: MRSA by PCR: NEGATIVE

## 2021-04-09 LAB — MAGNESIUM: Magnesium: 2.1 mg/dL (ref 1.7–2.4)

## 2021-04-09 SURGERY — ARTHROSCOPY, KNEE
Anesthesia: General | Site: Knee | Laterality: Right

## 2021-04-09 MED ORDER — ACETAMINOPHEN 160 MG/5ML PO SOLN: 1000.0000 mg | Freq: Once | ORAL | Status: AC | PRN

## 2021-04-09 MED ORDER — FENTANYL CITRATE (PF) 250 MCG/5ML IJ SOLN
INTRAMUSCULAR | Status: AC
  Filled 2021-04-09: qty 5

## 2021-04-09 MED ORDER — ORAL CARE MOUTH RINSE
15.0000 mL | Freq: Two times a day (BID) | OROMUCOSAL | Status: DC
  Administered 2021-04-10: 15 mL via OROMUCOSAL

## 2021-04-09 MED ORDER — MENTHOL 3 MG MT LOZG: 1.0000 | LOZENGE | OROMUCOSAL | Status: DC | PRN

## 2021-04-09 MED ORDER — HYDROMORPHONE HCL 1 MG/ML IJ SOLN
0.5000 mg | INTRAMUSCULAR | Status: DC | PRN
  Administered 2021-04-09 – 2021-04-10 (×4): 1 mg via INTRAVENOUS
  Filled 2021-04-09 (×4): qty 1

## 2021-04-09 MED ORDER — TORSEMIDE 20 MG PO TABS
20.0000 mg | ORAL_TABLET | Freq: Two times a day (BID) | ORAL | Status: DC
  Administered 2021-04-09: 20 mg via ORAL
  Filled 2021-04-09 (×2): qty 1

## 2021-04-09 MED ORDER — ASPIRIN 81 MG PO CHEW
81.0000 mg | CHEWABLE_TABLET | Freq: Two times a day (BID) | ORAL | Status: DC
  Administered 2021-04-09 – 2021-04-10 (×2): 81 mg via ORAL
  Filled 2021-04-09 (×2): qty 1

## 2021-04-09 MED ORDER — METOCLOPRAMIDE HCL 5 MG/ML IJ SOLN: 5.0000 mg | Freq: Three times a day (TID) | INTRAMUSCULAR | Status: DC | PRN

## 2021-04-09 MED ORDER — ACETAMINOPHEN 325 MG PO TABS
325.0000 mg | ORAL_TABLET | Freq: Four times a day (QID) | ORAL | Status: DC | PRN
  Filled 2021-04-09: qty 2

## 2021-04-09 MED ORDER — ORAL CARE MOUTH RINSE: 15.0000 mL | Freq: Once | OROMUCOSAL | Status: AC

## 2021-04-09 MED ORDER — OXYCODONE HCL 5 MG PO TABS
5.0000 mg | ORAL_TABLET | Freq: Once | ORAL | Status: DC | PRN
Start: 2021-04-09 — End: 2021-04-09

## 2021-04-09 MED ORDER — POLYETHYLENE GLYCOL 3350 17 G PO PACK: 17.0000 g | PACK | Freq: Every day | ORAL | Status: DC | PRN

## 2021-04-09 MED ORDER — METHOCARBAMOL 1000 MG/10ML IJ SOLN: 500.0000 mg | Freq: Four times a day (QID) | INTRAVENOUS | Status: DC | PRN

## 2021-04-09 MED ORDER — ONDANSETRON HCL 4 MG PO TABS: 4.0000 mg | ORAL_TABLET | Freq: Four times a day (QID) | ORAL | Status: DC | PRN

## 2021-04-09 MED ORDER — PROPOFOL 10 MG/ML IV BOLUS
INTRAVENOUS | Status: AC
  Filled 2021-04-09: qty 20

## 2021-04-09 MED ORDER — METHOCARBAMOL 500 MG PO TABS
500.0000 mg | ORAL_TABLET | Freq: Four times a day (QID) | ORAL | Status: DC | PRN
  Administered 2021-04-09 – 2021-04-10 (×2): 500 mg via ORAL
  Filled 2021-04-09 (×2): qty 1

## 2021-04-09 MED ORDER — ONDANSETRON HCL 4 MG/2ML IJ SOLN
INTRAMUSCULAR | Status: DC | PRN
  Administered 2021-04-09: 4 mg via INTRAVENOUS

## 2021-04-09 MED ORDER — MIDAZOLAM HCL 2 MG/2ML IJ SOLN
INTRAMUSCULAR | Status: AC
  Filled 2021-04-09: qty 2

## 2021-04-09 MED ORDER — SODIUM CHLORIDE 0.9 % IV SOLN: INTRAVENOUS | Status: DC

## 2021-04-09 MED ORDER — CHLORHEXIDINE GLUCONATE 0.12 % MT SOLN
15.0000 mL | Freq: Once | OROMUCOSAL | Status: AC
  Administered 2021-04-09: 15 mL via OROMUCOSAL

## 2021-04-09 MED ORDER — ROPINIROLE HCL 0.5 MG PO TABS
0.5000 mg | ORAL_TABLET | Freq: Every evening | ORAL | Status: DC | PRN
  Administered 2021-04-09: 1.5 mg via ORAL
  Filled 2021-04-09 (×2): qty 3

## 2021-04-09 MED ORDER — DOCUSATE SODIUM 100 MG PO CAPS
100.0000 mg | ORAL_CAPSULE | Freq: Two times a day (BID) | ORAL | Status: DC
  Administered 2021-04-09 – 2021-04-10 (×2): 100 mg via ORAL
  Filled 2021-04-09 (×2): qty 1

## 2021-04-09 MED ORDER — NITROGLYCERIN 0.4 MG SL SUBL: 0.4000 mg | SUBLINGUAL_TABLET | SUBLINGUAL | Status: DC | PRN

## 2021-04-09 MED ORDER — ALBUTEROL SULFATE HFA 108 (90 BASE) MCG/ACT IN AERS: 1.0000 | INHALATION_SPRAY | Freq: Four times a day (QID) | RESPIRATORY_TRACT | Status: DC | PRN

## 2021-04-09 MED ORDER — PHENYLEPHRINE 40 MCG/ML (10ML) SYRINGE FOR IV PUSH (FOR BLOOD PRESSURE SUPPORT)
PREFILLED_SYRINGE | INTRAVENOUS | Status: AC
  Filled 2021-04-09: qty 10

## 2021-04-09 MED ORDER — HYDRALAZINE HCL 20 MG/ML IJ SOLN
10.0000 mg | INTRAMUSCULAR | Status: DC | PRN
  Administered 2021-04-10 (×2): 10 mg via INTRAVENOUS
  Filled 2021-04-09 (×2): qty 1

## 2021-04-09 MED ORDER — SODIUM CHLORIDE 0.9 % IR SOLN
Status: DC | PRN
  Administered 2021-04-09: 3000 mL
  Administered 2021-04-09: 6000 mL
  Administered 2021-04-09: 3000 mL

## 2021-04-09 MED ORDER — PANTOPRAZOLE SODIUM 40 MG PO TBEC
40.0000 mg | DELAYED_RELEASE_TABLET | Freq: Every day | ORAL | Status: DC
  Administered 2021-04-09 – 2021-04-10 (×2): 40 mg via ORAL
  Filled 2021-04-09 (×2): qty 1

## 2021-04-09 MED ORDER — OXYCODONE HCL 5 MG/5ML PO SOLN: 5.0000 mg | Freq: Once | ORAL | Status: DC | PRN

## 2021-04-09 MED ORDER — TORSEMIDE 100 MG PO TABS
100.0000 mg | ORAL_TABLET | Freq: Two times a day (BID) | ORAL | Status: DC
  Filled 2021-04-09: qty 1

## 2021-04-09 MED ORDER — PROPOFOL 10 MG/ML IV BOLUS
INTRAVENOUS | Status: DC | PRN
  Administered 2021-04-09: 200 mg via INTRAVENOUS

## 2021-04-09 MED ORDER — METOCLOPRAMIDE HCL 5 MG PO TABS: 5.0000 mg | ORAL_TABLET | Freq: Three times a day (TID) | ORAL | Status: DC | PRN

## 2021-04-09 MED ORDER — ACETAMINOPHEN 10 MG/ML IV SOLN: 1000.0000 mg | Freq: Once | INTRAVENOUS | Status: DC | PRN

## 2021-04-09 MED ORDER — OXYCODONE-ACETAMINOPHEN 5-325 MG PO TABS: 1.0000 | ORAL_TABLET | ORAL | 0 refills | Status: DC | PRN

## 2021-04-09 MED ORDER — TORSEMIDE 20 MG PO TABS
20.0000 mg | ORAL_TABLET | Freq: Two times a day (BID) | ORAL | Status: DC
  Filled 2021-04-09: qty 1

## 2021-04-09 MED ORDER — DEXAMETHASONE SODIUM PHOSPHATE 10 MG/ML IJ SOLN
INTRAMUSCULAR | Status: DC | PRN
  Administered 2021-04-09: 10 mg via INTRAVENOUS

## 2021-04-09 MED ORDER — BUPIVACAINE-EPINEPHRINE (PF) 0.25% -1:200000 IJ SOLN
INTRAMUSCULAR | Status: AC
  Filled 2021-04-09: qty 30

## 2021-04-09 MED ORDER — ACETAMINOPHEN 500 MG PO TABS
1000.0000 mg | ORAL_TABLET | Freq: Once | ORAL | Status: AC | PRN
  Administered 2021-04-09: 1000 mg via ORAL

## 2021-04-09 MED ORDER — FLUTICASONE PROPIONATE 50 MCG/ACT NA SUSP
1.0000 | Freq: Every day | NASAL | Status: DC
  Administered 2021-04-10: 1 via NASAL
  Filled 2021-04-09: qty 16

## 2021-04-09 MED ORDER — PHENOL 1.4 % MT LIQD: 1.0000 | OROMUCOSAL | Status: DC | PRN

## 2021-04-09 MED ORDER — PHENYLEPHRINE 40 MCG/ML (10ML) SYRINGE FOR IV PUSH (FOR BLOOD PRESSURE SUPPORT)
PREFILLED_SYRINGE | INTRAVENOUS | Status: DC | PRN
  Administered 2021-04-09 (×5): 80 ug via INTRAVENOUS

## 2021-04-09 MED ORDER — FENTANYL CITRATE (PF) 100 MCG/2ML IJ SOLN
INTRAMUSCULAR | Status: DC | PRN
  Administered 2021-04-09: 25 ug via INTRAVENOUS

## 2021-04-09 MED ORDER — LACTATED RINGERS IV SOLN: INTRAVENOUS | Status: DC

## 2021-04-09 MED ORDER — CEFAZOLIN SODIUM-DEXTROSE 2-4 GM/100ML-% IV SOLN
2.0000 g | Freq: Four times a day (QID) | INTRAVENOUS | Status: AC
  Administered 2021-04-09 – 2021-04-10 (×2): 2 g via INTRAVENOUS
  Filled 2021-04-09 (×2): qty 100

## 2021-04-09 MED ORDER — BISACODYL 10 MG RE SUPP: 10.0000 mg | Freq: Every day | RECTAL | Status: DC | PRN

## 2021-04-09 MED ORDER — PRAVASTATIN SODIUM 20 MG PO TABS: 10.0000 mg | ORAL_TABLET | Freq: Every day | ORAL | Status: DC

## 2021-04-09 MED ORDER — OXYCODONE HCL 5 MG PO TABS
5.0000 mg | ORAL_TABLET | Freq: Four times a day (QID) | ORAL | Status: DC | PRN
  Administered 2021-04-09 – 2021-04-10 (×4): 5 mg via ORAL
  Filled 2021-04-09 (×4): qty 1

## 2021-04-09 MED ORDER — UMECLIDINIUM BROMIDE 62.5 MCG/INH IN AEPB
1.0000 | INHALATION_SPRAY | Freq: Every day | RESPIRATORY_TRACT | Status: DC
  Filled 2021-04-09: qty 7

## 2021-04-09 MED ORDER — BUPIVACAINE-EPINEPHRINE 0.25% -1:200000 IJ SOLN
INTRAMUSCULAR | Status: DC | PRN
  Administered 2021-04-09: 30 mL

## 2021-04-09 MED ORDER — TORSEMIDE 20 MG PO TABS
80.0000 mg | ORAL_TABLET | Freq: Once | ORAL | Status: AC
  Administered 2021-04-09: 80 mg via ORAL
  Filled 2021-04-09: qty 4

## 2021-04-09 MED ORDER — ONDANSETRON HCL 4 MG/2ML IJ SOLN: 4.0000 mg | Freq: Four times a day (QID) | INTRAMUSCULAR | Status: DC | PRN

## 2021-04-09 MED ORDER — ACETAMINOPHEN 500 MG PO TABS
1000.0000 mg | ORAL_TABLET | Freq: Four times a day (QID) | ORAL | Status: DC | PRN
  Administered 2021-04-10 (×2): 1000 mg via ORAL
  Filled 2021-04-09 (×2): qty 2

## 2021-04-09 MED ORDER — CHLORHEXIDINE GLUCONATE CLOTH 2 % EX PADS
6.0000 | MEDICATED_PAD | Freq: Every day | CUTANEOUS | Status: DC
  Administered 2021-04-09: 6 via TOPICAL

## 2021-04-09 MED ORDER — CEFAZOLIN IN SODIUM CHLORIDE 3-0.9 GM/100ML-% IV SOLN
3.0000 g | INTRAVENOUS | Status: AC
  Administered 2021-04-09: 3 g via INTRAVENOUS
  Filled 2021-04-09: qty 100

## 2021-04-09 MED ORDER — FENTANYL CITRATE (PF) 100 MCG/2ML IJ SOLN: 25.0000 ug | INTRAMUSCULAR | Status: DC | PRN

## 2021-04-09 MED ORDER — ACETAMINOPHEN 500 MG PO TABS
ORAL_TABLET | ORAL | Status: AC
  Filled 2021-04-09: qty 2

## 2021-04-09 SURGICAL SUPPLY — 39 items
BNDG CMPR MED 10X6 ELC LF (GAUZE/BANDAGES/DRESSINGS) ×1
BNDG COHESIVE 6X5 TAN STRL LF (GAUZE/BANDAGES/DRESSINGS) ×2 IMPLANT
BNDG ELASTIC 4X5.8 VLCR STR LF (GAUZE/BANDAGES/DRESSINGS) ×1 IMPLANT
BNDG ELASTIC 6X10 VLCR STRL LF (GAUZE/BANDAGES/DRESSINGS) ×1 IMPLANT
BNDG ELASTIC 6X5.8 VLCR STR LF (GAUZE/BANDAGES/DRESSINGS) ×2 IMPLANT
BNDG GAUZE ELAST 4 BULKY (GAUZE/BANDAGES/DRESSINGS) ×2 IMPLANT
BURR OVAL 8 FLU 4.0X13 (MISCELLANEOUS) ×1 IMPLANT
COVER SURGICAL LIGHT HANDLE (MISCELLANEOUS) ×2 IMPLANT
DISSECTOR  3.8MM X 13CM (MISCELLANEOUS) ×2
DISSECTOR 3.5MM X 13CM (MISCELLANEOUS) IMPLANT
DISSECTOR 3.8MM X 13CM (MISCELLANEOUS) IMPLANT
DISSECTOR 4.0MM X 13CM (MISCELLANEOUS) IMPLANT
DRAPE ORTHO 2.5IN SPLIT 77X108 (DRAPES) ×2 IMPLANT
DRAPE ORTHO SPLIT 77X108 STRL (DRAPES) ×4
DRAPE TOP 10253 STERILE (DRAPES) ×1 IMPLANT
DRAPE U-SHAPE 47X51 STRL (DRAPES) ×2 IMPLANT
DRSG PAD ABDOMINAL 8X10 ST (GAUZE/BANDAGES/DRESSINGS) ×2 IMPLANT
DURAPREP 26ML APPLICATOR (WOUND CARE) ×2 IMPLANT
ELECT REM PT RETURN 15FT ADLT (MISCELLANEOUS) IMPLANT
GAUZE SPONGE 4X4 12PLY STRL (GAUZE/BANDAGES/DRESSINGS) ×2 IMPLANT
GLOVE SURG ORTHO LTX SZ7.5 (GLOVE) ×2 IMPLANT
GLOVE SURG ORTHO LTX SZ8.5 (GLOVE) ×2 IMPLANT
GLOVE SURG POLY ORTHO LF SZ8 (GLOVE) ×2 IMPLANT
GLOVE SURG UNDER POLY LF SZ7.5 (GLOVE) ×2 IMPLANT
GOWN STRL REUS W/TWL XL LVL3 (GOWN DISPOSABLE) ×2 IMPLANT
KIT BASIN OR (CUSTOM PROCEDURE TRAY) ×2 IMPLANT
KIT TURNOVER KIT A (KITS) ×2 IMPLANT
MANIFOLD NEPTUNE II (INSTRUMENTS) ×2 IMPLANT
PACK ARTHROSCOPY WL (CUSTOM PROCEDURE TRAY) ×2 IMPLANT
PADDING CAST COTTON 6X4 STRL (CAST SUPPLIES) ×2 IMPLANT
PENCIL SMOKE EVACUATOR (MISCELLANEOUS) IMPLANT
PORT APPOLLO RF 90DEGREE MULTI (SURGICAL WAND) IMPLANT
PROTECTOR NERVE ULNAR (MISCELLANEOUS) ×2 IMPLANT
STRIP CLOSURE SKIN 1/2X4 (GAUZE/BANDAGES/DRESSINGS) ×2 IMPLANT
SUT ETHILON 3 0 PS 1 (SUTURE) ×2 IMPLANT
SUT MNCRL AB 4-0 PS2 18 (SUTURE) IMPLANT
TOWEL OR 17X26 10 PK STRL BLUE (TOWEL DISPOSABLE) ×2 IMPLANT
TUBING ARTHROSCOPY IRRIG 16FT (MISCELLANEOUS) ×2 IMPLANT
WRAP KNEE MAXI GEL POST OP (GAUZE/BANDAGES/DRESSINGS) ×2 IMPLANT

## 2021-04-09 NOTE — Brief Op Note (Signed)
04/09/2021  4:46 PM  PATIENT:  Lawrence Medina  54 y.o. male  PRE-OPERATIVE DIAGNOSIS:  Right knee medial meniscus tear, displaced, bakers cyst  POST-OPERATIVE DIAGNOSIS:  Right knee medial meniscus tear, displaced, chondromalacia, bakers cyst  PROCEDURE:  Procedure(s): ARTHROSCOPY KNEE / MEDIAL MENISECTOMY / CHANDROPLASTY / BAKERS CYST ASPIRATION (Right)  SURGEON:  Surgeon(s) and Role:    Netta Cedars, MD - Primary  PHYSICIAN ASSISTANT:   ASSISTANTS: none   ANESTHESIA:   local and general  EBL:  Minimal  BLOOD ADMINISTERED:none  DRAINS: none   LOCAL MEDICATIONS USED:  MARCAINE     SPECIMEN:  No Specimen  DISPOSITION OF SPECIMEN:  N/A  COUNTS:  YES  TOURNIQUET: none  DICTATION: .Other Dictation: Dictation Number [id number]  PLAN OF CARE: Admit for overnight observation  PATIENT DISPOSITION:  PACU - hemodynamically stable.   Delay start of Pharmacological VTE agent (>24hrs) due to surgical blood loss or risk of bleeding: no

## 2021-04-09 NOTE — Transfer of Care (Signed)
Immediate Anesthesia Transfer of Care Note  Patient: Lawrence Medina  Procedure(s) Performed: ARTHROSCOPY KNEE / MEDIAL MENISECTOMY / CHANDROPLASTY / BAKERS CYST ASPIRATION (Right Knee)  Patient Location: PACU  Anesthesia Type:General  Level of Consciousness: awake and patient cooperative  Airway & Oxygen Therapy: Patient Spontanous Breathing and Patient connected to face mask oxygen   Changed to CPAP Dr. Ermalene Postin and Dr. Elgie Congo at bedside patient cooperative  Post-op Assessment: Report given to RN and Post -op Vital signs reviewed and stable  Post vital signs: Reviewed and stable  Last Vitals:  Vitals Value Taken Time  BP 169/102 04/09/21 1651  Temp    Pulse 95 04/09/21 1653  Resp 18 04/09/21 1653  SpO2 74 % 04/09/21 1653  Vitals shown include unvalidated device data.  Last Pain:  Vitals:   04/09/21 1330  TempSrc: Oral  PainSc:       Patients Stated Pain Goal: 3 (62/37/62 8315)  Complications: No complications documented.

## 2021-04-09 NOTE — Interval H&P Note (Signed)
History and Physical Interval Note:  04/09/2021 2:27 PM  Lawrence Medina  has presented today for surgery, with the diagnosis of Right knee medial meniscus tear.  The various methods of treatment have been discussed with the patient and family. After consideration of risks, benefits and other options for treatment, the patient has consented to  Procedure(s): ARTHROSCOPY KNEE (Right) as a surgical intervention.  The patient's history has been reviewed, patient examined, no change in status, stable for surgery.  I have reviewed the patient's chart and labs.  Questions were answered to the patient's satisfaction.    Patient has known history of sleep apnea.  I stressed with the patient the importance of using his CPAP over the next several weeks in the immediate perioperative period. Patient will also be minimal weight bearing with a walker post op for the first week, progressing to full weight bearing after that.   [name]

## 2021-04-09 NOTE — Anesthesia Procedure Notes (Signed)
Procedure Name: LMA Insertion Date/Time: 04/09/2021 3:10 PM Performed by: Lind Covert, CRNA Pre-anesthesia Checklist: Patient identified, Emergency Drugs available, Suction available, Patient being monitored and Timeout performed Patient Re-evaluated:Patient Re-evaluated prior to induction Oxygen Delivery Method: Circle system utilized Preoxygenation: Pre-oxygenation with 100% oxygen Induction Type: IV induction LMA: LMA with gastric port inserted Laser Tube: Cuffed inflated with minimal occlusive pressure - saline Number of attempts: 2 (LMA proseal inserted unable to get good seal replaced with regular LMA 5) Placement Confirmation: positive ETCO2 and breath sounds checked- equal and bilateral Tube secured with: Tape Dental Injury: Teeth and Oropharynx as per pre-operative assessment  Difficulty Due To: Difficulty was anticipated Future Recommendations: Recommend- induction with short-acting agent, and alternative techniques readily available

## 2021-04-09 NOTE — Discharge Instructions (Signed)
   Ice to the knee constantly.  Keep the incisions covered and clean and dry for one week, then ok to get it wet in the shower.  Do exercise as instructed every hour, please to prevent stiffness.    DO NOT prop anything under the knee, it will make your knee stiff.  Prop under the ankle to encourage your knee to go straight. DO elevate to prevent swelling in the leg   Use the walker while you are up and around for balance and to take some weight off of your leg.  Wear your support stockings 24/7 to prevent blood clots and take baby aspirin twice daily for 30 days also to prevent blood clots  Follow up with Dr Veverly Fells in two weeks in the office, call [phone] for appt

## 2021-04-09 NOTE — Progress Notes (Signed)
Discussed hypertension with PA, who states she will reach out to Dr Veverly Fells prior to BP management , Sallade needs PCCM assistance for overnight care / Intervention

## 2021-04-09 NOTE — Progress Notes (Signed)
Inability to void. States he does not void till he takes his diuretic . Mild swelling  Outcome pending

## 2021-04-09 NOTE — Consult Note (Signed)
[facility] Medical Consultation  Lawrence Medina AYT:[id number] DOB: 1966/04/20 DOA: 04/09/2021 PCP: Leonard Downing, MD   Requesting physician: Dr. Veverly Fells. Date of consultation: April 09, 2021. Reason for consultation: Elevated blood pressure.  Impression/Recommendations Active Problems:   OSA (obstructive sleep apnea)   COPD mixed type (HCC)   Medial meniscus, posterior horn derangement, right   Hypertensive urgency    1. Hypertensive urgency -likely could be due to pain and also not taken his torsemide.  I have dosed his torsemide which he usually takes 100 mg twice a day along with as needed IV hydralazine.  Closely follow blood pressure trends.  Patient blood pressure is already improving after getting pain medication. 2. Sleep apnea on CPAP. 3. COPD not actively wheezing. 4. Possible transient episode of apneic spells per report as reported by patient's nurse.  We will continue to observe in stepdown.  EKG has been ordered along with labs including troponin metabolic panel and CBC.  I will followup again tomorrow. Please contact me if I can be of assistance in the meanwhile. Thank you for this consultation.  Chief Complaint: Patient came for right knee arthroscopy.  HPI:  55 year old male with a history of hypertension, COPD, sleep apnea with previous history of cardiac arrest of unknown cause had a right knee arthroscopy following which per report received the patient had some transient apneic spell and was brought to the stepdown for further observation.  On my exam patient is alert awake oriented nonfocal denies chest pain or shortness of breath.  Blood pressure is marked elevated with systolic more than 355 and this prompted the consult for hospitalist.  Review of Systems:  As present in the history of presenting illness nothing else significant.  Past Medical History:  Diagnosis Date  . Allergic rhinitis   . Arthritis   . Asthma   . Cancer ([place])    skin   . Complication of anesthesia   . COPD (chronic obstructive pulmonary disease) ([place])   . DDD (degenerative disc disease), lumbar   . GERD (gastroesophageal reflux disease)   . Headache   . High cholesterol   . Hypertension   . Myocardial infarction ([place])    2020  . OSA (obstructive sleep apnea)    CPAP  . Pneumonia   . PONV (postoperative nausea and vomiting)   . Seizures ([place]) 1982   x1 in 1982- unknown cause- none since  . Shortness of breath dyspnea    with exertion   Past Surgical History:  Procedure Laterality Date  . HERNIA REPAIR    . SHOULDER ARTHROSCOPY WITH ROTATOR CUFF REPAIR Left 02/23/2015   Procedure: SHOULDER ARTHROSCOPY WITH ROTATOR CUFF REPAIR BIOTENDONESIS REPAIR;  Surgeon: Marybelle Killings, MD;  Location: [facility];  Service: Orthopedics;  Laterality: Left;  . SUBMANDIBULAR GLAND EXCISION Left 1975  . TOTAL KNEE ARTHROPLASTY Left 1986   Social History:  reports that he has been smoking cigarettes. He has a 3.75 pack-year smoking history. He has never used smokeless tobacco. He reports current alcohol use of about 2.0 standard drinks of alcohol per week. He reports current drug use. Drug: Marijuana.  Allergies  Allergen Reactions  . Codeine Itching   Family History  Problem Relation Age of Onset  . Hypertension Mother   . Sleep apnea Mother   . Skin cancer Mother   . Lung cancer Father     Prior to Admission medications   Medication Sig Start Date End Date Taking? Authorizing Provider  diclofenac Sodium (  VOLTAREN) 1 % GEL Apply 1 application topically 2 (two) times daily as needed (pain).   Yes [provider]  fluticasone (FLONASE) 50 MCG/ACT nasal spray Place 1 spray into both nostrils daily.   Yes [provider]  lovastatin (MEVACOR) 10 MG tablet Take 10 mg by mouth at bedtime.   Yes [provider]  omeprazole (PRILOSEC) 40 MG capsule Take 40 mg by mouth daily as needed (acid reflux).   Yes [provider]  rOPINIRole  (REQUIP) 0.5 MG tablet Take 0.5-3 mg by mouth at bedtime as needed (RLS). 12/09/20  Yes [provider]  Tiotropium Bromide Monohydrate (SPIRIVA RESPIMAT) 2.5 MCG/ACT AERS Inhale 2 puffs into the lungs daily. 08/28/20  Yes Young, Tarri Fuller D, MD  torsemide (DEMADEX) 20 MG tablet Take 20 mg by mouth 2 (two) times daily.   Yes [provider]  albuterol (VENTOLIN HFA) 108 (90 Base) MCG/ACT inhaler Inhale 1 puff into the lungs every 6 (six) hours as needed for wheezing or shortness of breath.    [provider]  nitroGLYCERIN (NITROSTAT) 0.4 MG SL tablet Place 0.4 mg under the tongue every 5 (five) minutes as needed for chest pain.    [provider]  oxyCODONE-acetaminophen (PERCOCET/ROXICET) 5-325 MG tablet Take 1-2 tablets by mouth every 4 (four) hours as needed for severe pain. 04/09/21   Netta Cedars, MD   Physical Exam: Blood pressure (S) (!) 193/112, pulse 86, temperature 98.1 F (36.7 C), temperature source Axillary, resp. rate (!) 35, height 6' (1.829 m), weight (!) 160.6 kg, SpO2 95 %. Vitals:   04/09/21 2000 04/09/21 2024  BP: (S) (!) 193/112   Pulse: 86   Resp: (!) 35   Temp:  98.1 F (36.7 C)  SpO2: 95%      General: Moderately built and nourished.  Eyes: Anicteric no pallor.  ENT: No discharge from the ears eyes nose and mouth.  Neck: No mass felt.  No neck rigidity.  Cardiovascular: S1-S2 heard.  Respiratory: No rhonchi or crepitations.  Abdomen: Soft nontender bowel sound present.  Skin: No rash.  Musculoskeletal: No edema.  Psychiatric: Appears normal.  Neurologic: Alert awake oriented to time place and person.  Moves all extremities.  Labs on Admission:  Basic Metabolic Panel: No results for input(s): NA, K, CL, CO2, GLUCOSE, BUN, CREATININE, CALCIUM, MG, PHOS in the last 168 hours. Liver Function Tests: No results for input(s): AST, ALT, ALKPHOS, BILITOT, PROT, ALBUMIN in the last 168 hours. No results for input(s):  LIPASE, AMYLASE in the last 168 hours. No results for input(s): AMMONIA in the last 168 hours. CBC: Recent Labs  Lab 04/09/21 2219  WBC 13.1*  NEUTROABS 12.1*  HGB 15.0  HCT 47.5  MCV 88.8  PLT 237   Cardiac Enzymes: No results for input(s): CKTOTAL, CKMB, CKMBINDEX, TROPONINI in the last 168 hours. BNP: Invalid input(s): POCBNP CBG: Recent Labs  Lab 04/09/21 2022  GLUCAP 199*    Radiological Exams on Admission: No results found.    Time spent: 50 minutes.  Rise Patience [facility].  If 7PM-7AM, please contact night-coverage [email or web address] Password [facility] 04/09/2021, 10:47 PM

## 2021-04-09 NOTE — Progress Notes (Addendum)
Compliant of urinary retention : bladder scan showed . States his current torosimide order is incorrect : he states he takes 100mg  BID : Discussed with [facility] MD. Bladder scan revealed 365. Voided 200cc. Complaints of urgency and burning

## 2021-04-09 NOTE — Progress Notes (Signed)
hypertensive BP 190/108 states he was told to hold all medication prior to surgery , and resume after . Pharmacy has placed all meds to start 4/30. RN asked if meds could be started tonight if no contraindication. Spoke to pharmacist who is pushing back , states torsemide does not address blood pressure. Paged Ortho on-call provider

## 2021-04-09 NOTE — Progress Notes (Signed)
Much improved BP following torsemide adjustment and updated orders : Last acquired BP 139/96. Patient now able to wear CPAP / Bipap. He states he feels much better.

## 2021-04-09 NOTE — Progress Notes (Signed)
Spoke to PA from ortho, discussed case in detail she will consult [facility] to assist with BP management :

## 2021-04-10 ENCOUNTER — Other Ambulatory Visit: Payer: Self-pay

## 2021-04-10 DIAGNOSIS — I10 Essential (primary) hypertension: Secondary | ICD-10-CM | POA: Diagnosis not present

## 2021-04-10 DIAGNOSIS — S83241A Other tear of medial meniscus, current injury, right knee, initial encounter: Secondary | ICD-10-CM | POA: Diagnosis not present

## 2021-04-10 DIAGNOSIS — I160 Hypertensive urgency: Secondary | ICD-10-CM | POA: Diagnosis not present

## 2021-04-10 LAB — TROPONIN I (HIGH SENSITIVITY): Troponin I (High Sensitivity): 6 ng/L (ref <18)

## 2021-04-10 MED ORDER — TORSEMIDE 100 MG PO TABS
100.0000 mg | ORAL_TABLET | Freq: Two times a day (BID) | ORAL | Status: DC
  Administered 2021-04-10: 100 mg via ORAL
  Filled 2021-04-10 (×2): qty 1

## 2021-04-10 NOTE — Progress Notes (Signed)
Non complaint with cpap/bipap, taking off simple mask. I stressed importance of wearing oxygen for better oxygenation . He states he will leave first thing in the morning

## 2021-04-10 NOTE — Progress Notes (Signed)
Patient oxygen saturations dropping into the low 80's on RA while asleep.  Patient refusing face mask or cpap while napping.  Placed on 4 L [place].  Saturations rose to 94.

## 2021-04-10 NOTE — TOC Transition Note (Signed)
Transition of Care [facility]) - CM/SW Discharge Note   Patient Details  Name: Lawrence Medina MRN: [id number] Date of Birth: 11-03-1966  [facility]) CM/SW Contact:  Konrad Penta, RN Phone Number: 04/10/2021, 4:06 PM   Clinical Narrative:   Patient states he lives with his family and has assistance at home. Agreeable to home health for PT. No preference. Cory with [facility] able to accept referral.   Oxygen needed. [employer] delivered o2 to room.   Patient denies having any other needs. Denies needing [facility] RN.   Has PCP and plans to follow up with Dr. Arelia Sneddon.     Final next level of care: [place]     Patient Goals and CMS Choice Patient states their goals for this hospitalization and ongoing recovery are:: patient wants to return home CMS Medicare.gov Compare Post Acute Care list provided to:: Patient Choice offered to / list presented to : Patient  Discharge Placement                       Discharge Plan and Services     Post Acute Care Choice: Home Health          DME Arranged: Oxygen   Date DME Agency Contacted: 04/10/21 Time DME Agency Contacted: 1430 Representative spoke with at DME Agency: Brenton Grills with [place] Arranged: PT [place] Agency: [place] Date [place]: 04/10/21 Time [place]: 1400 Representative spoke with at [place]: Tommi Rumps with [place]  Social Determinants of Health ([place]) Interventions     Readmission Risk Interventions No flowsheet data found.

## 2021-04-10 NOTE — Evaluation (Signed)
Physical Therapy Evaluation Patient Details Name: Lawrence Medina MRN: [id number] DOB: [birth year] Today's Date: 04/10/2021   History of Present Illness  S/P ARTHROSCOPY KNEE / MEDIAL MENISECTOMY / CHANDROPLASTY / BAKERS CYST ASPIRATION (Right) on 04/09/21. Hypertensive and hypoxia post procedure  Clinical Impression  Patient very determined to go home today.Patient ambulated x 2' with RW. SPO2 89-94% while ambulating. HR in 80's. VerballyReviewed  Post  Op HEP  With patient, able to teach back routine. PATIENT REPORTS RIGHT LOWER LEG FEELS TINGLING AND NOT NORMAL SENSATION. STRENGTH OF FOOT IS WNL. RN NOTIFIED. PATIENT AMBULATED WITH NOTED INTERMITTENT DECREASED CONTROL TO ADVANCE -STATED-'MY LEG WANTS TO GO OUT TO THE RIGHT.' Pt admitted with above diagnosis.  Pt currently with functional limitations due to the deficits listed below (see PT Problem List). Pt will benefit from skilled PT to increase their independence and safety with mobility to allow discharge to the venue listed below.        patient Follow surgeon's recommendation for DC plan and follow-up therapies    Equipment Recommendations  Rolling walker with 5" wheels (bariatric)    Recommendations for Other Services       Precautions / Restrictions Precautions Precautions: Fall Precaution Comments: monitor sats and HR      Mobility  Bed Mobility Overal bed mobility: Needs Assistance Bed Mobility: Supine to Sit     Supine to sit: HOB elevated;Min assist     General bed mobility comments: support  right leg    Transfers Overall transfer level: Needs assistance Equipment used: Rolling walker (2 wheeled) Transfers: Sit to/from Stand Sit to Stand: Min assist;From elevated surface         General transfer comment: cues for hand placement and right leg prior to sitting  Ambulation/Gait Ambulation/Gait assistance: Min guard Gait Distance (Feet): 60 Feet Assistive device: Rolling walker (2 wheeled) Gait  Pattern/deviations: Step-to pattern Gait velocity: decr   General Gait Details: cues to stay withing RW for support.  Stairs            Wheelchair Mobility    Modified Rankin (Stroke Patients Only)       Balance Overall balance assessment: Needs assistance Sitting-balance support: No upper extremity supported;Feet supported Sitting balance-Leahy Scale: Good     Standing balance support: During functional activity;No upper extremity supported Standing balance-Leahy Scale: Fair                               Pertinent Vitals/Pain Pain Assessment: 0-10 Pain Score: 5  Pain Location: R knee Pain Descriptors / Indicators: Discomfort;Aching Pain Intervention(s): Monitored during session;Premedicated before session    Home Living Family/patient expects to be discharged to:: Private residence Living Arrangements: Children Available Help at Discharge: Family;Available 24 hours/day Type of Home: House Home Access: Ramped entrance     Home Layout: One level Home Equipment: Cane - single point      Prior Function Level of Independence: Independent with assistive device(s)               Hand Dominance   Dominant Hand: Right    Extremity/Trunk Assessment   Upper Extremity Assessment Upper Extremity Assessment: Overall WFL for tasks assessed    Lower Extremity Assessment Lower Extremity Assessment: RLE deficits/detail RLE Deficits / Details: strength 5/5 dorsi/plantar, eversion , inversion. knee extension 4/5. Reports tingling and decreased LT from mid leg through toes, circumferentially. RN notified    Cervical / Trunk Assessment Cervical /  Trunk Assessment: Normal  Communication   Communication: No difficulties  Cognition Arousal/Alertness: Awake/alert Behavior During Therapy: WFL for tasks assessed/performed Overall Cognitive Status: Within Functional Limits for tasks assessed                                         General Comments      Exercises     Assessment/Plan    PT Assessment Patient needs continued PT services  PT Problem List Decreased strength;Decreased mobility;Decreased safety awareness;Decreased activity tolerance;Decreased knowledge of use of DME;Pain       PT Treatment Interventions DME instruction;Therapeutic activities;Gait training;Therapeutic exercise;Patient/family education;Functional mobility training    PT Goals (Current goals can be found in the Care Plan section)  Acute Rehab PT Goals Patient Stated Goal: to go home by 12 PT Goal Formulation: With patient Time For Goal Achievement: 04/17/21 Potential to Achieve Goals: Good    Frequency Min 5X/week   Barriers to discharge        Co-evaluation               AM-PAC PT "6 Clicks" Mobility  Outcome Measure Help needed turning from your back to your side while in a flat bed without using bedrails?: A Little Help needed moving from lying on your back to sitting on the side of a flat bed without using bedrails?: A Little Help needed moving to and from a bed to a chair (including a wheelchair)?: A Little Help needed standing up from a chair using your arms (e.g., wheelchair or bedside chair)?: A Little Help needed to walk in hospital room?: A Little Help needed climbing 3-5 steps with a railing? : A Little 6 Click Score: 18    End of Session Equipment Utilized During Treatment: Gait belt Activity Tolerance: Patient tolerated treatment well Patient left: in chair;with call bell/phone within reach Nurse Communication: Mobility status PT Visit Diagnosis: Unsteadiness on feet (R26.81)    Time: 9767-3419 PT Time Calculation (min) (ACUTE ONLY): 30 min   Charges:     PT Treatments $Gait Training: 8-22 mins        [place] Pager [phone] Office [phone]   Claretha Cooper 04/10/2021, 12:12 PM

## 2021-04-10 NOTE — Progress Notes (Signed)
1400cc urine output documented

## 2021-04-10 NOTE — Progress Notes (Signed)
SATURATION QUALIFICATIONS: (This note is used to comply with regulatory documentation for home oxygen)  Patient Saturations on Room Air at Rest = 85%  Patient Saturations on Room Air while Ambulating = 88%  Patient Saturations on 2 Liters of oxygen while Ambulating = 94%  Please briefly explain why patient needs home oxygen:

## 2021-04-10 NOTE — Care Management CC44 (Signed)
Condition Code 44 Documentation Completed  Patient Details  Name: Madden Garron [place] MRN: [id number] Date of Birth: [birth year]   Condition Code 44 given:  Yes Patient signature on Condition Code 44 notice:  Yes (verbal consent) Documentation of 2 MD's agreement:  Yes Code 44 added to claim:  Yes    Konrad Penta, RN 04/10/2021, 4:30 PM

## 2021-04-10 NOTE — Progress Notes (Addendum)
Patient ambulated with physical therapy.  O2 saturations ranged from 89-94% on RA.  Patient states he does not feel SOB.  MD aware.

## 2021-04-10 NOTE — Progress Notes (Signed)
Went over discharge papers with patient.  All questions answered.  VSS.  Home needs set up and portable o2 tank in the room.  Pain controlled, pt ambulatory.

## 2021-04-10 NOTE — Op Note (Signed)
NAME: Lawrence Medina, [place] RECORD NO: [id number] ACCOUNT NO: [id number] DATE OF BIRTH: [birth year] FACILITY: [facility]ATION: [facility] PHYSICIAN: Doran Heater. Veverly Fells, MD  Operative Report   DATE OF PROCEDURE: 04/09/2021  PREOPERATIVE DIAGNOSES:  1.  Right knee medial meniscus tear. 2.  Baker's cyst.  POSTOPERATIVE DIAGNOSES:  1.  Right knee medial meniscus tear. 2.  Baker's cyst. 3.  Chondromalacia, medial compartment and patellofemoral compartment.  PROCEDURE PERFORMED:  Right knee arthroscopy with partial medial meniscectomy and chondroplasty, debridement type in the medial compartment and patellofemoral compartment.  ATTENDING SURGEON:  Doran Heater. Veverly Fells, MD.  ASSISTANT:  None.  ANESTHESIA:  General anesthesia was used plus local.  ESTIMATED BLOOD LOSS:  Minimal.  FLUID REPLACEMENT:  1000 mL crystalloid.    Instrument count was correct.    There were no complications.  Perioperative antibiotics were given.  INDICATIONS:  The patient is a 55 year old male with a history of right knee pain secondary to a torn meniscus.  The patient has failed an extended period of conservative management and presents for operative treatment to restore function and mobility.   The patient was complaining of debilitating pain requiring narcotic pain medication due to a displaced meniscus tear.  Risks including but not limited to infection and blood clots were discussed in detail with the patient.  Informed consent obtained.  DESCRIPTION OF PROCEDURE:  After an adequate level of anesthesia was achieved, the patient was positioned in the supine position.  The lateral post was used utilized on the right side of the table for the right leg.  The left leg was padded and secured  to the operating table.  Sterile prep and drape of the right knee performed.  Time-out called, verifying correct patient, correct site.  We entered the patient's knee using standard portals, including superolateral outflow,  anterolateral scope and  anteromedial working portals.  We identified the patellofemoral chondromalacia grade 3.  This was fairly minimal and central mostly the patella.  The trochlea looked pretty good.  Medial and lateral gutters inspected and free of loose bodies.  There was  a displaced medial meniscus tear upon entry into the medial compartment.  This was a flap tear where the flap was flipped underneath the condyle.  There was also a flap back near the meniscal root.  We debrided that using basket forceps and motorized  shaver.  Probably 50% to 60% of the posterior horn of the medial meniscus had to be removed.  This tapered into the mid body of the meniscus and the anterior meniscal root was intact.  Meniscal root integrity was felt to be intact.  There was  weightbearing articular cartilage damage on the tibia and the femur in the medial compartment.  This was localized and some loose flaps and fibrillation.  Tangential chondroplasty performed, removed enough synovium to be able to fully visualize the  articular surfaces and the joint.  We inspected the ACL, which looked like it had been chronically injured, but was intact.  The PCL was intact.  Lateral compartment looked pristine with no signs of any meniscal tears, little fraying of the free edge of  the lateral meniscus that was debrided.  Articular cartilage was soft, but looked to be intact with no tears.  Final inspection of the medial and lateral gutters, no loose bodies or meniscal fragments noted.  We then went ahead and sutured the wounds  using 3-0 nylon.  We injected with Marcaine.  I then aspirated the Baker's cyst in the back  of the knee and we obtained about 40 mL of fluid out of Baker's cyst using a spinal needle.  We applied a sterile bandage, Ace wrap from the toes to the thigh.   The patient was transported to recovery room in stable condition.      PAA D: 04/09/2021 4:50:54 pm T: 04/10/2021 5:10:00 am  JOB: [id number]/  [id number]

## 2021-04-10 NOTE — Progress Notes (Addendum)
Subjective: 1 Day Post-Op Procedure(s) (LRB): ARTHROSCOPY KNEE / MEDIAL MENISECTOMY / CHANDROPLASTY / BAKERS CYST ASPIRATION (Right) Patient reports pain as mild.   Wants to go home. Reports pain well controlled.  Objective: Vital signs in last 24 hours: Temp:  [97.5 F (36.4 C)-98.5 F (36.9 C)] 98 F (36.7 C) (04/30 0911) Pulse Rate:  [82-99] 95 (04/30 0808) Resp:  [18-35] 20 (04/30 0808) BP: (128-201)/(80-174) 145/80 (04/30 0847) SpO2:  [74 %-100 %] 94 % (04/30 0847) FiO2 (%):  [100 %] 100 % (04/29 1950) Weight:  [160.6 kg] 160.6 kg (04/29 1329)  Intake/Output from previous day: 04/29 0701 - 04/30 0700 In: 1400 [I.V.:1300; IV Piggyback:100] Out: 1400 [Urine:1400] Intake/Output this shift: Total I/O In: 240 [P.O.:240] Out: 300 [Urine:300]  Recent Labs    04/09/21 2219  HGB 15.0   Recent Labs    04/09/21 2219  WBC 13.1*  RBC 5.35  HCT 47.5  PLT 237   Recent Labs    04/09/21 2219  NA 135  K 4.6  CL 100  CO2 27  BUN 14  CREATININE 1.25*  GLUCOSE 226*  CALCIUM 8.7*   No results for input(s): LABPT, INR in the last 72 hours.  Neurologically intact ABD soft Neurovascular intact Sensation intact distally Intact pulses distally Dorsiflexion/Plantar flexion intact Incision: dressing C/D/I and no drainage No cellulitis present Compartment soft No sign of DVT   Assessment/Plan: 1 Day Post-Op Procedure(s) (LRB): ARTHROSCOPY KNEE / MEDIAL MENISECTOMY / CHANDROPLASTY / BAKERS CYST ASPIRATION (Right) Advance diet Up with therapy D/C IV fluids Plan for D/C today as long as medicine agrees he is stable Discussed with Dr. Veverly Fells Please message or call my cell [phone] for D/C orders if ok per medicine  Lawrence Medina 04/10/2021, 10:31 AM

## 2021-04-10 NOTE — Progress Notes (Signed)
Consult Progress Note    Kmari Halter Kosch   QQI:[id number]  DOB: 09/23/1966  DOA: 04/09/2021     1  PCP: Leonard Downing, MD  Consult for: BP management  Hospital Course: Mr. Woerner is a 55 yo male with PMH HTN, OSA/OHS, COPD, cardiac arrest July 2020 (likely 2/2 acute on chronic respiratory failure from PNA at that time). He is followed outpatient by cardiology as well; recently seen 03/29/21 for cardiac clearance prior to his R knee surgery. He has had stable blood pressures on his home dose of torsemide 100 mg twice daily.  He has not required any recent medication adjustment. He states that he held all his medications prior to surgery.  He underwent his right knee surgery on 04/09/2021 and his blood pressure was noted to be consistently elevated after and consult was requested for BP management/recommendations.  Interval History:  Resting in bed this morning in no distress.  Very adamant that he wants to go home today. Was resumed on his home dose of torsemide last night; some blood pressures were elevated this morning but without PRN treatments it appears that they have leveled out.  Pain is controlled and not felt to be a factor largely contributing at this time.   ROS: Constitutional: negative for chills and fevers, Respiratory: negative for cough and sputum, Cardiovascular: negative for chest pain and Gastrointestinal: negative for abdominal pain  Assessment & Plan: Hypertension Hypertensive urgency - resolved - some pain postop but this was managed well and quickly; he states he held all his meds prior to surgery; given his dose of torsemide at home (verified with patient) he likely had some rebound HTN from holding it - torsemide 100 mg BID has now been resumed and BP is stabilizing this morning; no PRN hydralazine was needed this morning; he also had a well-controlled blood pressure in the office visit on 03/29/2021 (108/78) with cardiology. Despite outpt med rec stating  different torsemide dose, he fully confirms with me his dose is 100 mg BID (was increased ~1 year ago) - no other BP med adjustments needed or recommended at this time - patient stable from my standpoint for d/c home on his regimen (torsemide 100 mg BID)  OSA/OHS - continue home CPAP  Right knee medial meniscus tear - s/p right medial menisectomy/chrondroplasty on 04/09/21; tolerated well - rec's and limitation per orthopedic surgery   Old records reviewed in assessment of this patient   DVT prophylaxis: SCDs Start: 04/09/21 1950 Place TED hose Start: 04/09/21 1950   Code Status:   Code Status: Full Code  Objective: Blood pressure (!) 162/92, pulse 87, temperature 98 F (36.7 C), temperature source Oral, resp. rate (!) 22, height 6' (1.829 m), weight (!) 160.6 kg, SpO2 94 %.  Examination: General appearance: Morbidly obese gentleman resting in bed in no distress with intermittent snoring in between sentences Head: Normocephalic, without obvious abnormality, atraumatic Eyes: EOMI Lungs: Distant but clear breath sounds anteriorly Heart: regular rate and rhythm and S1, S2 normal Abdomen: Distant but noticeable present bowel sounds.  Nontender.  Obese Extremities: Right lower extremity in brace.  Trace lower extremity edema noted in general but confounded with body habitus Skin: mobility and turgor normal Neurologic: Grossly normal  Data Reviewed: I have personally reviewed following labs and imaging studies Results for orders placed or performed during the hospital encounter of 04/09/21 (from the past 24 hour(s))  MRSA PCR Screening     Status: None   Collection Time: 04/09/21  7:50  PM   Specimen: Nasal Mucosa; Nasopharyngeal  Result Value Ref Range   MRSA by PCR NEGATIVE NEGATIVE  Glucose, capillary     Status: Abnormal   Collection Time: 04/09/21  8:22 PM  Result Value Ref Range   Glucose-Capillary 199 (H) 70 - 99 mg/dL  Troponin I (High Sensitivity)     Status: None    Collection Time: 04/09/21 10:19 PM  Result Value Ref Range   Troponin I (High Sensitivity) 5 <18 ng/L  Basic metabolic panel     Status: Abnormal   Collection Time: 04/09/21 10:19 PM  Result Value Ref Range   Sodium 135 135 - 145 mmol/L   Potassium 4.6 3.5 - 5.1 mmol/L   Chloride 100 98 - 111 mmol/L   CO2 27 22 - 32 mmol/L   Glucose, Bld 226 (H) 70 - 99 mg/dL   BUN 14 6 - 20 mg/dL   Creatinine, Ser 1.25 (H) 0.61 - 1.24 mg/dL   Calcium 8.7 (L) 8.9 - 10.3 mg/dL   GFR, Estimated >60 >60 mL/min   Anion gap 8 5 - 15  Magnesium     Status: None   Collection Time: 04/09/21 10:19 PM  Result Value Ref Range   Magnesium 2.1 1.7 - 2.4 mg/dL  CBC with Differential/Platelet     Status: Abnormal   Collection Time: 04/09/21 10:19 PM  Result Value Ref Range   WBC 13.1 (H) 4.0 - 10.5 K/uL   RBC 5.35 4.22 - 5.81 MIL/uL   Hemoglobin 15.0 13.0 - 17.0 g/dL   HCT 47.5 39.0 - 52.0 %   MCV 88.8 80.0 - 100.0 fL   MCH 28.0 26.0 - 34.0 pg   MCHC 31.6 30.0 - 36.0 g/dL   RDW 16.1 (H) 11.5 - 15.5 %   Platelets 237 150 - 400 K/uL   nRBC 0.0 0.0 - 0.2 %   Neutrophils Relative % 92 %   Neutro Abs 12.1 (H) 1.7 - 7.7 K/uL   Lymphocytes Relative 5 %   Lymphs Abs 0.7 0.7 - 4.0 K/uL   Monocytes Relative 2 %   Monocytes Absolute 0.2 0.1 - 1.0 K/uL   Eosinophils Relative 0 %   Eosinophils Absolute 0.0 0.0 - 0.5 K/uL   Basophils Relative 0 %   Basophils Absolute 0.0 0.0 - 0.1 K/uL   Immature Granulocytes 1 %   Abs Immature Granulocytes 0.08 (H) 0.00 - 0.07 K/uL  Urinalysis, Routine w reflex microscopic Urine, Random     Status: Abnormal   Collection Time: 04/09/21 10:50 PM  Result Value Ref Range   Color, Urine YELLOW YELLOW   APPearance CLEAR CLEAR   Specific Gravity, Urine 1.011 1.005 - 1.030   pH 6.0 5.0 - 8.0   Glucose, UA 150 (A) NEGATIVE mg/dL   Hgb urine dipstick NEGATIVE NEGATIVE   Bilirubin Urine NEGATIVE NEGATIVE   Ketones, ur NEGATIVE NEGATIVE mg/dL   Protein, ur NEGATIVE NEGATIVE mg/dL    Nitrite NEGATIVE NEGATIVE   Leukocytes,Ua NEGATIVE NEGATIVE  Troponin I (High Sensitivity)     Status: None   Collection Time: 04/10/21 12:14 AM  Result Value Ref Range   Troponin I (High Sensitivity) 6 <18 ng/L    Recent Results (from the past 240 hour(s))  SARS CORONAVIRUS 2 (TAT 6-24 HRS) Nasopharyngeal Nasopharyngeal Swab     Status: None   Collection Time: 04/06/21  8:44 AM   Specimen: Nasopharyngeal Swab  Result Value Ref Range Status   SARS Coronavirus 2 NEGATIVE NEGATIVE Final  Comment: (NOTE) SARS-CoV-2 target nucleic acids are NOT DETECTED.  The SARS-CoV-2 RNA is generally detectable in upper and lower respiratory specimens during the acute phase of infection. Negative results do not preclude SARS-CoV-2 infection, do not rule out co-infections with other pathogens, and should not be used as the sole basis for treatment or other patient management decisions. Negative results must be combined with clinical observations, patient history, and epidemiological information. The expected result is Negative.  Fact Sheet for Patients: SugarRoll.be  Fact Sheet for Healthcare Providers: https://www.woods-mathews.com/  This test is not yet approved or cleared by the [place] FDA and  has been authorized for detection and/or diagnosis of SARS-CoV-2 by FDA under an Emergency Use Authorization (EUA). This EUA will remain  in effect (meaning this test can be used) for the duration of the COVID-19 declaration under Se ction 564(b)(1) of the Act, 21 U.S.C. section 360bbb-3(b)(1), unless the authorization is terminated or revoked sooner.  Performed at [facility], [place] [address] [place], [place] [address]   MRSA PCR Screening     Status: None   Collection Time: 04/09/21  7:50 PM   Specimen: Nasal Mucosa; Nasopharyngeal  Result Value Ref Range Status   MRSA by PCR NEGATIVE NEGATIVE Final    Comment:        The GeneXpert MRSA  Assay (FDA approved for NASAL specimens only), is one component of a comprehensive MRSA colonization surveillance program. It is not intended to diagnose MRSA infection nor to guide or monitor treatment for MRSA infections. Performed at [facility], [place] [address], [place], [place] [address]      Radiology Studies: No results found. No orders to display    Scheduled Meds: . aspirin  81 mg Oral BID  . Chlorhexidine Gluconate Cloth  6 each Topical Daily  . docusate sodium  100 mg Oral BID  . fluticasone  1 spray Each Nare Daily  . mouth rinse  15 mL Mouth Rinse BID  . pantoprazole  40 mg Oral Daily  . pravastatin  10 mg Oral q1800  . torsemide  100 mg Oral BID  . umeclidinium bromide  1 puff Inhalation Daily   PRN Meds: acetaminophen, acetaminophen, albuterol, bisacodyl, hydrALAZINE, HYDROmorphone (DILAUDID) injection, menthol-cetylpyridinium **OR** phenol, methocarbamol **OR** methocarbamol (ROBAXIN) IV, metoCLOPramide **OR** metoCLOPramide (REGLAN) injection, nitroGLYCERIN, ondansetron **OR** ondansetron (ZOFRAN) IV, oxyCODONE, polyethylene glycol, rOPINIRole Continuous Infusions: . methocarbamol (ROBAXIN) IV       LOS: 1 day  Time spent: Greater than 50% of the 35 minute visit was spent in counseling/coordination of care for the patient as laid out in the A&P.   Dwyane Dee, MD [facility] 04/10/2021, 11:38 AM

## 2021-04-10 NOTE — Care Management Obs Status (Signed)
[place] NOTIFICATION   Patient Details  Name: Lawrence Medina MRN: [id number] Date of Birth: January 12, 1966   Medicare Observation Status Notification Given:  Yes    Konrad Penta, RN 04/10/2021, 4:29 PM

## 2021-04-12 ENCOUNTER — Encounter (HOSPITAL_COMMUNITY): Payer: Self-pay | Admitting: Orthopedic Surgery

## 2021-04-13 NOTE — Anesthesia Postprocedure Evaluation (Signed)
Anesthesia Post Note  Patient: Valente Fosberg Stallings  Procedure(s) Performed: ARTHROSCOPY KNEE / MEDIAL MENISECTOMY / CHANDROPLASTY / BAKERS CYST ASPIRATION (Right Knee)     Patient location during evaluation: PACU Anesthesia Type: General Level of consciousness: awake and alert Pain management: pain level controlled Vital Signs Assessment: post-procedure vital signs reviewed and stable Respiratory status: spontaneous breathing and respiratory function stable (BiPAP) Cardiovascular status: blood pressure returned to baseline and stable Postop Assessment: no apparent nausea or vomiting Anesthetic complications: no   No complications documented.  Last Vitals:  Vitals:   04/10/21 1400 04/10/21 1708  BP: (!) 159/86   Pulse: 91   Resp: 17   Temp:  36.7 C  SpO2: 96%     Last Pain:  Vitals:   04/10/21 1724  TempSrc:   PainSc: 6                  Dickie Cloe

## 2021-04-16 NOTE — Discharge Summary (Signed)
In most cases prophylactic antibiotics for Dental procdeures after total joint surgery are not necessary.  Exceptions are as follows:  1. History of prior total joint infection  2. Severely immunocompromised (Organ Transplant, cancer chemotherapy, Rheumatoid biologic meds such as [place])  3. Poorly controlled diabetes (A1C &gt; 8.0, blood glucose over 200)  If you have one of these conditions, contact your surgeon for an antibiotic prescription, prior to your dental procedure. Orthopedic Discharge Summary        Physician Discharge Summary  Patient ID: Lawrence Medina MRN: [id number] DOB/AGE: [birth year] 55 y.o.  Admit date: 04/09/2021 Discharge date: 04/10/2021   Procedures:  Procedure(s) (LRB): ARTHROSCOPY KNEE / MEDIAL MENISECTOMY / CHANDROPLASTY / BAKERS CYST ASPIRATION (Right)  Attending Physician:  Dr. Esmond Plants  Admission Diagnoses:   Right knee meniscus tear and chondromalacia  Discharge Diagnoses:  same   Past Medical History:  Diagnosis Date  . Allergic rhinitis   . Arthritis   . Asthma   . Cancer ([facility] [place])    skin  . Complication of anesthesia   . COPD (chronic obstructive pulmonary disease) ([place])   . DDD (degenerative disc disease), lumbar   . GERD (gastroesophageal reflux disease)   . Headache   . High cholesterol   . Hypertension   . Myocardial infarction ([place])    2020  . OSA (obstructive sleep apnea)    CPAP  . Pneumonia   . PONV (postoperative nausea and vomiting)   . Seizures ([place]) 1982   x1 in 1982- unknown cause- none since  . Shortness of breath dyspnea    with exertion    PCP: Leonard Downing, MD   Discharged Condition: good  Hospital Course:  Patient underwent the above stated procedure on 04/09/2021. Patient tolerated the procedure well and brought to the recovery room in good condition and subsequently to the floor. Patient developed hypertension and 02 requirement post op requiring overnight observation and internal  medicine consultation. Patient did recover well overnight and was see by medicine on 4/30 and was stable for discharge.   Disposition: Discharge disposition: 01-Home or Self Care      with follow up in 2 weeks    Follow-up Information    Netta Cedars, MD. Call in 2 weeks.   Specialty: Orthopedic Surgery Why: [phone] Contact information: [address] [place] [address] [phone]               Dental Antibiotics:  In most cases prophylactic antibiotics for Dental procdeures after total joint surgery are not necessary.  Exceptions are as follows:  1. History of prior total joint infection  2. Severely immunocompromised (Organ Transplant, cancer chemotherapy, Rheumatoid biologic meds such as [place])  3. Poorly controlled diabetes (A1C &gt; 8.0, blood glucose over 200)  If you have one of these conditions, contact your surgeon for an antibiotic prescription, prior to your dental procedure.  Discharge Instructions    Call MD / Call 911   Complete by: As directed    If you experience chest pain or shortness of breath, CALL 911 and be transported to the hospital emergency room.  If you develope a fever above 101 F, pus (white drainage) or increased drainage or redness at the wound, or calf pain, call your surgeon's office.   Constipation Prevention   Complete by: As directed    Drink plenty of fluids.  Prune juice [place] be helpful.  You Haf[place] use a stool softener, such as Colace (over the counter)  100 mg twice a day.  Use MiraLax (over the counter) for constipation as needed.   Diet - low sodium heart healthy   Complete by: As directed    Increase activity slowly as tolerated   Complete by: As directed    Post-operative opioid taper instructions:   Complete by: As directed    POST-OPERATIVE OPIOID TAPER INSTRUCTIONS: It is important to wean off of your opioid medication as soon as possible. If you do not need pain medication after your surgery  it is ok to stop day one. Opioids include: Codeine, Hydrocodone(Norco, Vicodin), Oxycodone(Percocet, oxycontin) and hydromorphone amongst others.  Long term and even short term use of opiods can cause: Increased pain response Dependence Constipation Depression Respiratory depression And more.  Withdrawal symptoms can include Flu like symptoms Nausea, vomiting And more Techniques to manage these symptoms Hydrate well Eat regular healthy meals Stay active Use relaxation techniques(deep breathing, meditating, yoga) Do Not substitute Alcohol to help with tapering If you have been on opioids for less than two weeks and do not have pain than it is ok to stop all together.  Plan to wean off of opioids This plan should start within one week post op of your joint replacement. Maintain the same interval or time between taking each dose and first decrease the dose.  Cut the total daily intake of opioids by one tablet each day Next start to increase the time between doses. The last dose that should be eliminated is the evening dose.         Allergies as of 04/10/2021      Reactions   Codeine Itching      Medication List    TAKE these medications   albuterol 108 (90 Base) MCG/ACT inhaler Commonly known as: VENTOLIN HFA Inhale 1 puff into the lungs every 6 (six) hours as needed for wheezing or shortness of breath.   diclofenac Sodium 1 % Gel Commonly known as: VOLTAREN Apply 1 application topically 2 (two) times daily as needed (pain).   fluticasone 50 MCG/ACT nasal spray Commonly known as: FLONASE Place 1 spray into both nostrils daily.   lovastatin 10 MG tablet Commonly known as: MEVACOR Take 10 mg by mouth at bedtime.   nitroGLYCERIN 0.4 MG SL tablet Commonly known as: NITROSTAT Place 0.4 mg under the tongue every 5 (five) minutes as needed for chest pain.   omeprazole 40 MG capsule Commonly known as: PRILOSEC Take 40 mg by mouth daily as needed (acid reflux).    oxyCODONE-acetaminophen 5-325 MG tablet Commonly known as: PERCOCET/ROXICET Take 1-2 tablets by mouth every 4 (four) hours as needed for severe pain. What changed:   how much to take  when to take this  reasons to take this   rOPINIRole 0.5 MG tablet Commonly known as: REQUIP Take 0.5-3 mg by mouth at bedtime as needed (RLS).   Spiriva Respimat 2.5 MCG/ACT Aers Generic drug: Tiotropium Bromide Monohydrate Inhale 2 puffs into the lungs daily.   torsemide 20 MG tablet Commonly known as: DEMADEX Take 20 mg by mouth 2 (two) times daily.         Signed: [name] 04/16/2021, 10:22 AM  [facility] is now [facility] [address] [place], [place], [place] [address] Phone: [place]

## 2021-06-30 DIAGNOSIS — M5136 Other intervertebral disc degeneration, lumbar region: Secondary | ICD-10-CM | POA: Insufficient documentation

## 2021-06-30 DIAGNOSIS — T8859XA Other complications of anesthesia, initial encounter: Secondary | ICD-10-CM | POA: Insufficient documentation

## 2021-06-30 DIAGNOSIS — R519 Headache, unspecified: Secondary | ICD-10-CM | POA: Insufficient documentation

## 2021-06-30 DIAGNOSIS — J45909 Unspecified asthma, uncomplicated: Secondary | ICD-10-CM | POA: Insufficient documentation

## 2021-06-30 DIAGNOSIS — R112 Nausea with vomiting, unspecified: Secondary | ICD-10-CM | POA: Insufficient documentation

## 2021-06-30 DIAGNOSIS — M1990 Unspecified osteoarthritis, unspecified site: Secondary | ICD-10-CM | POA: Insufficient documentation

## 2021-06-30 DIAGNOSIS — I219 Acute myocardial infarction, unspecified: Secondary | ICD-10-CM | POA: Insufficient documentation

## 2021-06-30 DIAGNOSIS — Z9889 Other specified postprocedural states: Secondary | ICD-10-CM | POA: Insufficient documentation

## 2021-06-30 DIAGNOSIS — C801 Malignant (primary) neoplasm, unspecified: Secondary | ICD-10-CM | POA: Insufficient documentation

## 2021-06-30 DIAGNOSIS — K219 Gastro-esophageal reflux disease without esophagitis: Secondary | ICD-10-CM | POA: Insufficient documentation

## 2021-06-30 DIAGNOSIS — J309 Allergic rhinitis, unspecified: Secondary | ICD-10-CM | POA: Insufficient documentation

## 2021-07-12 NOTE — Progress Notes (Deleted)
Cardiology Office Note:    Date:  07/12/2021   ID:  Lawrence Medina, DOB September 03, 1966, MRN [id number]  PCP:  Leonard Downing, MD  Cardiologist:  Shirlee More, MD    Referring MD: Leonard Downing, *    ASSESSMENT:    No diagnosis found. PLAN:    In order of problems listed above:  ***   Next appointment: ***   Medication Adjustments/Labs and Tests Ordered: Current medicines are reviewed at length with the patient today.  Concerns regarding medicines are outlined above.  No orders of the defined types were placed in this encounter.  No orders of the defined types were placed in this encounter.   No chief complaint on file.   History of Present Illness:    Lawrence Medina is a 55 y.o. 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 Compliance with diet, lifestyle and medications: *** Past Medical History:  Diagnosis Date   Allergic rhinitis    Arthritis    Asthma    Cancer ([place])    skin   Complication of anesthesia    COPD (chronic obstructive pulmonary disease) ([facility])    DDD (degenerative disc disease), lumbar    GERD (gastroesophageal reflux disease)    Headache    High cholesterol    Hypertension    Myocardial infarction ([place])    2020   OSA (obstructive sleep apnea)    CPAP   Pneumonia    PONV (postoperative nausea and vomiting)    Seizures ([place]) 1982   x1 in 1982- unknown cause- none since   Shortness of breath dyspnea    with exertion    Past Surgical History:   Procedure Laterality Date   HERNIA REPAIR     KNEE ARTHROSCOPY Right 04/09/2021   Procedure: ARTHROSCOPY KNEE / MEDIAL MENISECTOMY / CHANDROPLASTY / BAKERS CYST ASPIRATION;  Surgeon: Netta Cedars, MD;  Location: [facility];  Service: Orthopedics;  Laterality: Right;   SHOULDER ARTHROSCOPY WITH ROTATOR CUFF REPAIR Left 02/23/2015   Procedure: SHOULDER ARTHROSCOPY WITH ROTATOR CUFF REPAIR BIOTENDONESIS REPAIR;  Surgeon: Marybelle Killings, MD;  Location: [facility];  Service: Orthopedics;  Laterality: Left;   SUBMANDIBULAR GLAND EXCISION Left 1975   TOTAL KNEE ARTHROPLASTY Left 1986    Current Medications: No outpatient medications have been marked as taking for the 07/13/21 encounter (Appointment) with Richardo Priest, MD.     Allergies:   Codeine   Social History   Socioeconomic History   Marital status: Single    Spouse name: Not on file   Number of children: Not on file   Years of education: Not on file   Highest education level: Not on file  Occupational History   Not on file  Tobacco Use   Smoking status: Every Day    Packs/day: 0.25    Years: 15.00    Pack years: 3.75    Types: Cigarettes   Smokeless tobacco: Never   Tobacco comments:    10 per day   Vaping Use   Vaping Use:  Never used  Substance and Sexual Activity   Alcohol use: Yes    Alcohol/week: 2.0 standard drinks    Types: 2 Cans of beer per week    Comment: 2 beers per week    Drug use: Yes    Types: Marijuana   Sexual activity: Yes  Other Topics Concern   Not on file  Social History Narrative   ** Merged History Encounter **       Social Determinants of Health   Financial Resource Strain: Not on file  Food Insecurity: Not on file  Transportation Needs: Not on file  Physical Activity: Not on file  Stress: Not on file  Social Connections: Not on file     Family History: The patient's ***family history includes Hypertension in his mother; Lung cancer in his father; Skin cancer in his mother; Sleep apnea in  his mother. ROS:   Please see the history of present illness.    All other systems reviewed and are negative.  EKGs/Labs/Other Studies Reviewed:    The following studies were reviewed today:  EKG:  EKG ordered today and personally reviewed.  The ekg ordered today demonstrates ***  Recent Labs: 04/09/2021: BUN 14; Creatinine, Ser 1.25; Hemoglobin 15.0; Magnesium 2.1; Platelets 237; Potassium 4.6; Sodium 135  Recent Lipid Panel    Component Value Date/Time   CHOL 207 (H) 06/14/2019 1926   TRIG 147 06/16/2019 0931   HDL 35 (L) 06/14/2019 1926   CHOLHDL 5.9 06/14/2019 1926   VLDL 29 06/14/2019 1926   LDLCALC 143 (H) 06/14/2019 1926    Physical Exam:    VS:  There were no vitals taken for this visit.    Wt Readings from Last 3 Encounters:  04/09/21 (!) 354 lb (160.6 kg)  04/02/21 (!) 354 lb (160.6 kg)  03/29/21 (!) 353 lb (160.1 kg)     GEN: *** Well nourished, well developed in no acute distress HEENT: Normal NECK: No JVD; No carotid bruits LYMPHATICS: No lymphadenopathy CARDIAC: ***RRR, no murmurs, rubs, gallops RESPIRATORY:  Clear to auscultation without rales, wheezing or rhonchi  ABDOMEN: Soft, non-tender, non-distended MUSCULOSKELETAL:  No edema; No deformity  SKIN: Warm and dry NEUROLOGIC:  Alert and oriented x 3 PSYCHIATRIC:  Normal affect    Signed, Shirlee More, MD  07/12/2021 3:23 PM    [facility]

## 2021-07-13 ENCOUNTER — Ambulatory Visit: Payer: Medicare Other | Admitting: Cardiology

## 2021-07-13 ENCOUNTER — Encounter: Payer: Self-pay | Admitting: Cardiology

## 2021-08-02 ENCOUNTER — Ambulatory Visit (INDEPENDENT_AMBULATORY_CARE_PROVIDER_SITE_OTHER): Payer: Medicare Other | Admitting: Gastroenterology

## 2021-08-02 ENCOUNTER — Encounter: Payer: Self-pay | Admitting: Gastroenterology

## 2021-08-02 VITALS — BP 110/70 | HR 108 | Ht 72.0 in | Wt 336.0 lb

## 2021-08-02 DIAGNOSIS — R1031 Right lower quadrant pain: Secondary | ICD-10-CM | POA: Diagnosis not present

## 2021-08-02 DIAGNOSIS — M542 Cervicalgia: Secondary | ICD-10-CM | POA: Diagnosis not present

## 2021-08-02 NOTE — Patient Instructions (Addendum)
If you are age 57 or older, your body mass index should be between 23-30. Your Body mass index is 45.57 kg/m. If this is out of the aforementioned range listed, please consider follow up with your Primary Care Provider.  If you are age 81 or younger, your body mass index should be between 19-25. Your Body mass index is 45.57 kg/m. If this is out of the aformentioned range listed, please consider follow up with your Primary Care Provider.   __________________________________________________________  The [facility] providers would like to encourage you to use [facility] to communicate with providers for non-urgent requests or questions.  Due to long hold times on the telephone, sending your provider a message by [facility] be a faster and more efficient way to get a response.  Please allow 48 business hours for a response.  Please remember that this is for non-urgent requests.    Please follow up with Dr.Shoemaker at [facility]  [place], [place], [place] [address] [phone]  It was a pleasure to see you today!  Thank you for trusting me with your gastrointestinal care!

## 2021-08-02 NOTE — Progress Notes (Signed)
[facility] Progress Note  Chief Complaint: Left neck pain  Subjective  History: Jerrye Beavers was seen once in office consultation 05/07/2020, details in that note.  On this occasion, he request evaluation by me for some swelling in the left side of his neck and worsening dysphagia.  From 03/02/2021 ENT office note by Dr. Jerrell Belfast: "Kylor Graff is a 55 y.o. male who presents as a return patient.  Patient presents for evaluation with a 1 month history of painful irritation in the left submandibular region. He was seen on 01/21/2021 for evaluation for [employer]plant for management of obstructive sleep apnea. The patient has morbid obesity with BMI of almost 50. Not a surgical candidate at this time. He continues to use CPAP on a nightly basis with some difficulty. He had a childhood history of a neck cyst which was removed from the left neck. He reports non-smoking. He tolerates normal oral diet without difficulty, no ulcer, lesion or mass. No recent fever or respiratory tract infection. He complains of point tenderness in the left submandibular region with fullness and increased saliva discharge. No purulent discharge or evidence of acute obstruction.   No cervical lymphadenopathy, mass or swelling. Tender to palpation in the left submandibular region. No palpable mass or swelling, normal salivary flow without evidence of obstruction or purulent discharge. Normal facial nerve function. Normal thyroid gland palpation.   The patient presents for evaluation with a 1 month history of tenderness in the left submandibular region. No fever or other active symptoms of infection. He has point tenderness over the left submandibular gland and no lymphadenopathy or other abnormal finding. Given his history and clinical findings I recommended treating him for sialoadenitis, patient prescribed Augmentin 875 twice daily for 2 weeks. He will follow salivary precautions as outlined below. Monitor symptoms  and plan follow-up in 1 month for recheck. [place] consider CT scan of the neck if he continues to have ongoing symptoms.  Based on the patient's history and examination, findings are consistent with salivary gland obstruction and possible infection. I have recommended the following salivary precautions to increased flow and reduce the risk of obstruction: Increase oral fluid intake, elevate head of bed, warm compresses over the affected area, gentle massage from posterior to anterior, soft diet as tolerated and frequent use of sialagogues. Continue medications as prescribed. The patient will contact our office if they have increased symptoms of swelling, erythema or fever.  Early Chars. Wilburn Cornelia, M.D. [facility] "  _________________  Jerrye Beavers was in ED in [place] on 07/24/2021 for 3 months of right lower abdominal tenderness.  He says he went there because he called the [facility] ED and was told there was a 7-hour wait. ED provider note indicates CT scan was done that showed aneurysm of the infrarenal abdominal aorta.  He was seen by Dr. Louanna Raw of [facility] on 07/28/2021 for right lower abdominal.  pain.  Excerpt from that note: He has had severe right lower abdominal pain which she was afraid was appendicitis or kidney stone or a hernia. He was seen at the emergency department in [place] on 07/24/21 and a CT scan was completed showing a enlarging aortic aneurysm and no other acute abdominal pathology. I do not have the images, but reviewed the report of the scan. He says the pain is directly over his external inguinal ring and can point this area with his finger. This area is tender with palpation.  He says he had a right inguinal  hernia repair while he was incarcerated. The hernia had been bulging down into his scrotum at that time." ___________   Jerrye Beavers says the pain in his right groin has been constant over the last several months, sometimes worse with movements.  It is not affected by bowel  movements and he has had no blood in the stool.  He can point to the specific area along the right inguinal canal and says it hurts to touch. He also feels the swelling and pain in the left side of the face and jaw have worsened slowly in the last few months since he saw ENT, he feels as if now something is "moving" and there when he swallows.  He "passed out" twice last week for unclear reasons, which is also part of why he went to the [facility] ED.  He is able to eat and drink without difficulty or having anything feeling stuck.  ROS: Cardiovascular:  no chest pain Respiratory: Chronic cough and dyspnea both at rest and with exertion. Multifocal chronic pain Remainder of systems negative except as above The patient's Past Medical, Family and Social History were reviewed and are on file in the EMR. Past Medical History:  Diagnosis Date   Allergic rhinitis    Arthritis    Asthma    Cancer ([place])    skin   Complication of anesthesia    COPD (chronic obstructive pulmonary disease) ([place])    DDD (degenerative disc disease), lumbar    GERD (gastroesophageal reflux disease)    Headache    High cholesterol    Hypertension    Myocardial infarction ([place])    2020   OSA (obstructive sleep apnea)    CPAP   Pneumonia    PONV (postoperative nausea and vomiting)    Seizures ([place]) 1982   x1 in 1982- unknown cause- none since   Shortness of breath dyspnea    with exertion   Past Surgical History:  Procedure Laterality Date   HERNIA REPAIR     KNEE ARTHROSCOPY Right 04/09/2021   Procedure: ARTHROSCOPY KNEE / MEDIAL MENISECTOMY / CHANDROPLASTY / BAKERS CYST ASPIRATION;  Surgeon: Netta Cedars, MD;  Location: [facility];  Service: Orthopedics;  Laterality: Right;   SHOULDER ARTHROSCOPY WITH ROTATOR CUFF REPAIR Left 02/23/2015   Procedure: SHOULDER ARTHROSCOPY WITH ROTATOR CUFF REPAIR BIOTENDONESIS REPAIR;  Surgeon: Marybelle Killings, MD;  Location: [facility];  Service: Orthopedics;  Laterality: Left;    SUBMANDIBULAR GLAND EXCISION Left 1975   TOTAL KNEE ARTHROPLASTY Left 1986    Objective:  Med list reviewed  Current Outpatient Medications:    albuterol (VENTOLIN HFA) 108 (90 Base) MCG/ACT inhaler, Inhale 1 puff into the lungs every 6 (six) hours as needed for wheezing or shortness of breath., Disp: , Rfl:    diclofenac Sodium (VOLTAREN) 1 % GEL, Apply 1 application topically 2 (two) times daily as needed (pain)., Disp: , Rfl:    fluticasone (FLONASE) 50 MCG/ACT nasal spray, Place 1 spray into both nostrils daily., Disp: , Rfl:    HYDROcodone-acetaminophen (NORCO) 7.5-325 MG tablet, SMARTSIG:1 Tablet(s) By Mouth Every 5 Hours PRN, Disp: , Rfl:    lovastatin (MEVACOR) 10 MG tablet, Take 10 mg by mouth at bedtime., Disp: , Rfl:    nitroGLYCERIN (NITROSTAT) 0.4 MG SL tablet, Place 0.4 mg under the tongue every 5 (five) minutes as needed for chest pain., Disp: , Rfl:    omeprazole (PRILOSEC) 40 MG capsule, Take 40 mg by mouth daily as needed (acid reflux)., Disp: , Rfl:  OXYGEN, Inhale into the lungs. 2.3 liters 24/7, Disp: , Rfl:    rOPINIRole (REQUIP) 0.5 MG tablet, Take 0.5-3 mg by mouth at bedtime as needed (RLS)., Disp: , Rfl:    Tiotropium Bromide Monohydrate (SPIRIVA RESPIMAT) 2.5 MCG/ACT AERS, Inhale 2 puffs into the lungs daily., Disp: 4 g, Rfl: 12   torsemide (DEMADEX) 100 MG tablet, Take 100 mg by mouth 2 (two) times daily., Disp: , Rfl:    Vital signs in last 24 hrs: Vitals:   08/02/21 1409  BP: 110/70  Pulse: (!) 108   Wt Readings from Last 3 Encounters:  08/02/21 (!) 336 lb (152.4 kg)  04/09/21 (!) 354 lb (160.6 kg)  04/02/21 (!) 354 lb (160.6 kg)    Physical Exam Of note, patient is not wearing oxygen today.  He appears in chronically poor health. Alert and conversational, no respiratory distress, though he becomes dyspneic transferring from table to chair. HEENT: sclera anicteric, oral mucosa moist without lesions.  Oropharynx is clear.  No dysphonia Neck: supple,  no thyromegaly, left side of the face under the ear and along the jawline is swollen compared to the right, no visible inflammation.  Tender throughout the area. Cardiac: RRR without murmurs, S1S2 heard, +peripheral edema Pulm: clear to auscultation bilaterally, normal RR and effort noted Abdomen: soft, no tenderness, with active bowel sounds. No guarding or palpable hepatosplenomegaly.  He has point tenderness in the mid inguinal canal without palpable hernia or other visible abnormality. Skin; warm and dry, no jaundice or rash  Labs:   ___________________________________________ Radiologic studies: 1. No evidence of acute process within the abdomen or pelvis.  2. Aneurysmal enlargement of the infrarenal abdominal aorta measuring up to 3.6 cm in diameter.   Dorothe Pea, MD 07/24/2021 12:30 PM Narrative  CT abdomen and pelvis without intravenous contrast.   Indication: Right lower quadrant abdominal pain.   Comparison: None.   Technique: Axial CT imaging of the abdomen and pelvis was performed without intravenous contrast. Coronal and sagittal reformats were generated and reviewed. This CT exam was performed using one or more of the following dose reduction techniques: Automated exposure control, adjustment of the mA and/or kV according to patient size, or use of iterative reconstruction technique.   Findings:  Normal heart size. No pleural or pericardial effusions. Lung bases are clear.   The absence of intravenous contrast limits evaluation of the solid abdominal viscera. Hepatic steatosis. No biliary ductal dilatation. The gallbladder, pancreas, spleen, and adrenal glands are normal in appearance. There is no evidence of urolithiasis or obstructive uropathy. Urinary bladder is grossly normal in appearance. The prostate gland is normal in size.   No bowel obstruction or bowel wall thickening. Sigmoid diverticulosis without diverticulitis. Normal appendix. The stomach is normal in  appearance. No free air or fluid within the abdomen or pelvis. No adenopathy.   Atherosclerotic calcifications of the abdominal aorta with fusiform aneurysmal enlargement of the infrarenal abdominal aorta measuring up to 3.6 cm in diameter.   No acute bony abnormality. Degenerative changes of the spine. No evidence of bone lesion.   ____________________________________________ Other:   _____________________________________________ Assessment & Plan  Assessment: Encounter Diagnoses  Name Primary?   Right groin pain Yes   Neck pain on left side    Right groin pain, not gastrointestinal in origin.  He thought he might need a colonoscopy for this, but that procedures not indicated for groin pain.  I still feel he is too high risk for screening colonoscopy due to his  multiple severe medical comorbidities.  He has pain and swelling in the left side of the face and jaw which have apparently slowly worsened since he saw ENT several months ago.  This needs reevaluation and probable imaging, especially considering smoking history.   Plan: I recommended he contact Dr. Victorio Palm office today, we gave him the address and the phone number and I will send my note as well.  He will otherwise see me as needed.  32 minutes were spent on this encounter (including extensive chart review, history/exam, counseling/coordination of care, and documentation) > 50% of that time was spent on counseling and coordination of care.   Nelida Meuse III

## 2021-09-21 ENCOUNTER — Other Ambulatory Visit: Payer: Self-pay

## 2021-09-21 ENCOUNTER — Ambulatory Visit (INDEPENDENT_AMBULATORY_CARE_PROVIDER_SITE_OTHER): Payer: Medicare Other | Admitting: Orthopaedic Surgery

## 2021-09-21 ENCOUNTER — Ambulatory Visit: Payer: Self-pay

## 2021-09-21 VITALS — BP 136/84 | HR 98 | Ht 72.0 in | Wt 339.0 lb

## 2021-09-21 DIAGNOSIS — M7061 Trochanteric bursitis, right hip: Secondary | ICD-10-CM | POA: Diagnosis not present

## 2021-09-21 DIAGNOSIS — M25551 Pain in right hip: Secondary | ICD-10-CM | POA: Diagnosis not present

## 2021-09-21 MED ORDER — LIDOCAINE HCL 1 % IJ SOLN
1.0000 mL | INTRAMUSCULAR | Status: AC | PRN
  Administered 2021-09-21: 1 mL

## 2021-09-21 MED ORDER — METHYLPREDNISOLONE ACETATE 40 MG/ML IJ SUSP
40.0000 mg | INTRAMUSCULAR | Status: AC | PRN
  Administered 2021-09-21: 40 mg via INTRA_ARTICULAR

## 2021-09-21 MED ORDER — BUPIVACAINE HCL 0.25 % IJ SOLN
2.0000 mL | INTRAMUSCULAR | Status: AC | PRN
  Administered 2021-09-21: 2 mL via INTRA_ARTICULAR

## 2021-09-21 NOTE — Progress Notes (Signed)
Office Visit Note   Patient: Lawrence Medina           Date of Birth: 04/14/1966           MRN: [id number] Visit Date: 09/21/2021              Requested by: Leonard Downing, MD [address] [place],  [place] [address] PCP: Leonard Downing, MD   Assessment & Plan: Visit Diagnoses:  1. Pain in right hip   2. Trochanteric bursitis, right hip     Plan:   Follow-Up Instructions: No follow-ups on file.   Orders:  Orders Placed This Encounter  Procedures   Large Joint Inj   XR HIP UNILAT W OR W/O PELVIS 1V RIGHT   No orders of the defined types were placed in this encounter.     Procedures: Large Joint Inj: R greater trochanter on 09/21/2021 1:02 PM Details: lateral approach Medications: 1 mL lidocaine 1 %; 40 mg methylPREDNISolone acetate 40 MG/ML; 2 mL bupivacaine 0.25 %     Clinical Data: No additional findings.   Subjective: Chief Complaint  Patient presents with   Right Hip - Pain    HPI 55 year old male with severe right hip pain using a cane.  States he has a walker but his feet get caught in it and he trips.  He has had pain for about 6 months she states she has been worked up for hernia.  He states he is out of breath pain is very severe in the sitting with his right leg extended jumping up and down without bouncing his foot on the floor.  Patient has a BMI of 45.  History of COPD GERD previous MI hypertension, heart failure.  Patient states his pain is severe and has been taking hydrocodone every 3 hours and is slurring his words.  He states pain is present whether sitting or standing worse when he gets up and he can walk without the cane but it is very painful.  PDMP  N421, S170, total 280.  Review of Systems negative for associated chills or fever no associated bowel bladder symptoms.  Previous knee arthroscopy right knee Dr. Chaney Malling April 2022.   Objective: Vital Signs: BP 136/84   Pulse 98   Ht 6' (1.829 m)   Wt (!) 339 lb (153.8 kg)    BMI 45.98 kg/m   Physical Exam Constitutional:      Appearance: He is well-developed.  HENT:     Head: Normocephalic and atraumatic.     Right Ear: External ear normal.     Left Ear: External ear normal.  Eyes:     Pupils: Pupils are equal, round, and reactive to light.  Neck:     Thyroid: No thyromegaly.     Trachea: No tracheal deviation.  Cardiovascular:     Rate and Rhythm: Normal rate.  Pulmonary:     Effort: Pulmonary effort is normal.     Breath sounds: No wheezing.  Abdominal:     General: Bowel sounds are normal.     Palpations: Abdomen is soft.  Musculoskeletal:     Cervical back: Neck supple.  Skin:    General: Skin is warm and dry.     Capillary Refill: Capillary refill takes less than 2 seconds.  Neurological:     Mental Status: He is alert and oriented to person, place, and time.  Psychiatric:        Behavior: Behavior normal.  Thought Content: Thought content normal.        Judgment: Judgment normal.    Ortho Exam negative logroll of hips.  I can internally externally rotate his hip 30 to 40 degrees without pain.  Knee and ankle jerk are intact.  Exquisite tenderness over the right greater trochanter, he withdraws with pain and cries out.  Knee reaches full extension no pain with hip flexion.  Distal pulse palpable.  Negative straight leg raising right left negative popliteal compression test.  Specialty Comments:  No specialty comments available.  Imaging: No results found.   PMFS History: Patient Active Problem List   Diagnosis Date Noted   Trochanteric bursitis, right hip 09/21/2021   Allergic rhinitis 06/30/2021   Arthritis 06/30/2021   Asthma 06/30/2021   Cancer ([place]) 34/74/2595   Complication of anesthesia 06/30/2021   DDD (degenerative disc disease), lumbar 06/30/2021   GERD (gastroesophageal reflux disease) 06/30/2021   Headache 06/30/2021   Myocardial infarction ([place]) 06/30/2021   PONV (postoperative nausea and vomiting)  06/30/2021   Medial meniscus, posterior horn derangement, right 04/09/2021   COPD mixed type ([place]) 10/01/2019   Pneumonia 10/01/2019   AKI (acute kidney injury) ([place]) 06/25/2019   Hypokalemia 06/25/2019   Essential hypertension 06/25/2019   Acute respiratory failure with hypoxia ([place])    Cardiac arrest ([place]) 06/14/2019   OSA (obstructive sleep apnea) 05/22/2019   Body mass index (BMI) of 45.0-49.9 in adult [facility]) 05/03/2019   Current every day smoker 05/03/2019   Chest tightness 05/02/2019   Dizziness 05/02/2019   Hypertensive heart disease with heart failure ([place]) 03/28/2019   Hyperlipidemia 03/28/2019   Abdominal aortic atherosclerosis ([place]) 03/28/2019   AAA (abdominal aortic aneurysm) without rupture 03/28/2019   Seizures ([place]) 1982   Past Medical History:  Diagnosis Date   Allergic rhinitis    Arthritis    Asthma    Cancer ([place])    skin   Complication of anesthesia    COPD (chronic obstructive pulmonary disease) ([facility])    DDD (degenerative disc disease), lumbar    GERD (gastroesophageal reflux disease)    Headache    High cholesterol    Hypertension    Myocardial infarction ([place])    2020   OSA (obstructive sleep apnea)    CPAP   Pneumonia    PONV (postoperative nausea and vomiting)    Seizures ([place]) 1982   x1 in 1982- unknown cause- none since   Shortness of breath dyspnea    with exertion    Family History  Problem Relation Age of Onset   Hypertension Mother    Sleep apnea Mother    Skin cancer Mother    Lung cancer Father    Colon cancer Neg Hx    Esophageal cancer Neg Hx     Past Surgical History:  Procedure Laterality Date   HERNIA REPAIR     KNEE ARTHROSCOPY Right 04/09/2021   Procedure: ARTHROSCOPY KNEE / MEDIAL MENISECTOMY / CHANDROPLASTY / BAKERS CYST ASPIRATION;  Surgeon: Netta Cedars, MD;  Location: [facility];  Service: Orthopedics;  Laterality: Right;   SHOULDER ARTHROSCOPY WITH ROTATOR CUFF REPAIR Left 02/23/2015   Procedure: SHOULDER ARTHROSCOPY  WITH ROTATOR CUFF REPAIR BIOTENDONESIS REPAIR;  Surgeon: Marybelle Killings, MD;  Location: [facility];  Service: Orthopedics;  Laterality: Left;   SUBMANDIBULAR GLAND EXCISION Left 1975   TOTAL KNEE ARTHROPLASTY Left 1986   Social History   Occupational History   Occupation: disabled  Tobacco Use   Smoking status: Every Day  Packs/day: 0.50    Years: 15.00    Pack years: 7.50    Types: Cigarettes   Smokeless tobacco: Never   Tobacco comments:    10 per day   Vaping Use   Vaping Use: Never used  Substance and Sexual Activity   Alcohol use: Not Currently    Alcohol/week: 2.0 standard drinks    Types: 2 Cans of beer per week    Comment: 2 beers per week    Drug use: Not Currently    Types: Marijuana   Sexual activity: Yes

## 2021-09-29 ENCOUNTER — Ambulatory Visit
Admission: RE | Admit: 2021-09-29 | Discharge: 2021-09-29 | Disposition: A | Discharge status: Home / Self Care | Payer: Medicare Other | Source: Ambulatory Visit | Attending: Nurse Practitioner | Admitting: Nurse Practitioner

## 2021-09-29 ENCOUNTER — Other Ambulatory Visit: Payer: Self-pay | Admitting: Nurse Practitioner

## 2021-09-29 DIAGNOSIS — R0781 Pleurodynia: Secondary | ICD-10-CM

## 2021-10-07 ENCOUNTER — Other Ambulatory Visit: Payer: Self-pay

## 2021-10-07 ENCOUNTER — Telehealth: Payer: Self-pay | Admitting: Orthopaedic Surgery

## 2021-10-07 DIAGNOSIS — M25551 Pain in right hip: Secondary | ICD-10-CM

## 2021-10-07 DIAGNOSIS — M7061 Trochanteric bursitis, right hip: Secondary | ICD-10-CM

## 2021-10-07 NOTE — Telephone Encounter (Signed)
Patient called. Says he is still in pain. Wanted Dr. Lorin Mercy to know His call back number is [phone]

## 2021-10-07 NOTE — Telephone Encounter (Signed)
LMOM for patient that we are scheduling him for an MRI

## 2021-11-01 ENCOUNTER — Other Ambulatory Visit: Payer: Self-pay | Admitting: Nurse Practitioner

## 2021-11-07 ENCOUNTER — Ambulatory Visit
Admission: RE | Admit: 2021-11-07 | Discharge: 2021-11-07 | Disposition: A | Discharge status: Home / Self Care | Payer: Medicare Other | Source: Ambulatory Visit | Attending: Orthopaedic Surgery | Admitting: Orthopaedic Surgery

## 2021-11-07 ENCOUNTER — Other Ambulatory Visit: Payer: Self-pay

## 2021-11-07 DIAGNOSIS — M7061 Trochanteric bursitis, right hip: Secondary | ICD-10-CM

## 2021-11-09 ENCOUNTER — Other Ambulatory Visit: Payer: Self-pay | Admitting: Nurse Practitioner

## 2021-11-09 DIAGNOSIS — N6452 Nipple discharge: Secondary | ICD-10-CM

## 2021-11-30 ENCOUNTER — Ambulatory Visit
Admission: RE | Admit: 2021-11-30 | Discharge: 2021-11-30 | Disposition: A | Discharge status: Home / Self Care | Payer: Medicare Other | Source: Ambulatory Visit | Attending: Nurse Practitioner | Admitting: Nurse Practitioner

## 2021-11-30 DIAGNOSIS — N6452 Nipple discharge: Secondary | ICD-10-CM

## 2022-02-15 ENCOUNTER — Other Ambulatory Visit: Payer: Self-pay | Admitting: Nurse Practitioner

## 2022-02-17 ENCOUNTER — Other Ambulatory Visit: Payer: Self-pay | Admitting: Family Medicine

## 2022-02-17 DIAGNOSIS — I714 Abdominal aortic aneurysm, without rupture, unspecified: Secondary | ICD-10-CM

## 2022-02-24 ENCOUNTER — Ambulatory Visit
Admission: RE | Admit: 2022-02-24 | Discharge: 2022-02-24 | Disposition: A | Discharge status: Home / Self Care | Payer: Medicare Other | Source: Ambulatory Visit | Attending: Family Medicine | Admitting: Family Medicine

## 2022-02-24 ENCOUNTER — Other Ambulatory Visit: Payer: Self-pay

## 2022-02-24 ENCOUNTER — Other Ambulatory Visit: Payer: Self-pay | Admitting: Family Medicine

## 2022-02-24 DIAGNOSIS — I714 Abdominal aortic aneurysm, without rupture, unspecified: Secondary | ICD-10-CM

## 2022-03-02 ENCOUNTER — Other Ambulatory Visit: Payer: Self-pay | Admitting: Family Medicine

## 2022-03-02 DIAGNOSIS — I713 Abdominal aortic aneurysm, ruptured, unspecified: Secondary | ICD-10-CM

## 2022-04-26 ENCOUNTER — Other Ambulatory Visit: Payer: Self-pay | Admitting: Otolaryngology

## 2022-04-26 DIAGNOSIS — R600 Localized edema: Secondary | ICD-10-CM

## 2022-05-20 ENCOUNTER — Ambulatory Visit
Admission: RE | Admit: 2022-05-20 | Discharge: 2022-05-20 | Disposition: A | Discharge status: Home / Self Care | Payer: Medicare Other | Source: Ambulatory Visit | Attending: Otolaryngology | Admitting: Otolaryngology

## 2022-05-20 ENCOUNTER — Other Ambulatory Visit: Payer: Medicare Other

## 2022-05-20 DIAGNOSIS — R600 Localized edema: Secondary | ICD-10-CM

## 2022-05-20 MED ORDER — IOPAMIDOL (ISOVUE-300) INJECTION 61%
100.0000 mL | Freq: Once | INTRAVENOUS | Status: AC | PRN
  Administered 2022-05-20: 100 mL via INTRAVENOUS

## 2022-10-27 LAB — LAB REPORT - SCANNED: Creatinine, Random Urine: 6.4

## 2022-11-15 ENCOUNTER — Ambulatory Visit: Payer: Medicare Other | Admitting: Cardiology

## 2022-12-15 LAB — LAB REPORT - SCANNED: EGFR: 90

## 2023-02-15 LAB — LAB REPORT - SCANNED: EGFR: 96

## 2023-02-21 NOTE — Progress Notes (Signed)
Cardiology Office Note:    Date:  02/21/2023   ID:  Lawrence Medina, DOB [birth year], MRN [id number]  PCP:  Leonard Downing, MD  Cardiologist:  Shirlee More, MD    Referring MD: Leonard Downing, *    ASSESSMENT:    1. Hypertensive heart disease with heart failure ([place])   2. OSA (obstructive sleep apnea)   3. Body mass index (BMI) of 45.0-49.9 in adult [facility])    PLAN:    In order of problems listed above:  I am unsure how well Lawrence Medina is doing he is obviously noncompliant with medications he request to give him a 2-week period of time to take his meds religiously and will send me a list of blood pressures if he remains greater than 140/90 he will need optimization in preparation for surgery.  In the interim continue his weight loss endeavors his calcium channel blocker and ACE inhibitor.  He is also on a loop diuretic and has no fluid overload with his previous heart failure will recheck echocardiogram preoperatively No longer on CPAP Strongly encourage continued weight loss Obtain recent labs done with his PCP   Next appointment: 6 months   Medication Adjustments/Labs and Tests Ordered: Current medicines are reviewed at length with the patient today.  Concerns regarding medicines are outlined above.  No orders of the defined types were placed in this encounter.  No orders of the defined types were placed in this encounter.   Chief complaint overdue for follow-up and anticipate having staged bilateral total knee arthroplasty   History of Present Illness:    Lawrence Medina is a 57 y.o. male with a hx of hypertensive heart disease with heart failure COPD and obstructive sleep apnea last seen 09/10/2019.  He has a history of previous asystolic arrest in the setting of pneumonia and severe untreated sleep apnea.  Echocardiogram July 2020 showed moderate LVH EF 60 to 65%.  Compliance with diet, lifestyle and medications: No, he acknowledges he has not been taking his  medications on a regular basis since the death of his mother he said he would use a pillbox get back to take them and send me a list of his blood pressures in 2 weeks  Lawrence Medina is very motivated now to take care of his home his father since the death of his mother and he wants to have knee arthroplasty to enhance his ability Is lost 80 pounds and has another 55 to go Longer needs sleep apnea treatment with CPAP He feels much better he is not having shortness of breath edema chest pain palpitation or syncope Past Medical History:  Diagnosis Date   Allergic rhinitis    Arthritis    Asthma    Cancer ([place])    skin   Complication of anesthesia    COPD (chronic obstructive pulmonary disease) ([place])    DDD (degenerative disc disease), lumbar    GERD (gastroesophageal reflux disease)    Headache    High cholesterol    Hypertension    Myocardial infarction ([place])    2020   OSA (obstructive sleep apnea)    CPAP   Pneumonia    PONV (postoperative nausea and vomiting)    Seizures ([place]) 1982   x1 in 1982- unknown cause- none since   Shortness of breath dyspnea    with exertion    Past Surgical History:  Procedure Laterality Date   HERNIA REPAIR     KNEE ARTHROSCOPY Right 04/09/2021   Procedure: ARTHROSCOPY KNEE /  MEDIAL MENISECTOMY / CHANDROPLASTY / BAKERS CYST ASPIRATION;  Surgeon: Netta Cedars, MD;  Location: [facility];  Service: Orthopedics;  Laterality: Right;   SHOULDER ARTHROSCOPY WITH ROTATOR CUFF REPAIR Left 02/23/2015   Procedure: SHOULDER ARTHROSCOPY WITH ROTATOR CUFF REPAIR BIOTENDONESIS REPAIR;  Surgeon: Marybelle Killings, MD;  Location: [place];  Service: Orthopedics;  Laterality: Left;   SUBMANDIBULAR GLAND EXCISION Left 1975   TOTAL KNEE ARTHROPLASTY Left 1986    Current Medications: No outpatient medications have been marked as taking for the 02/22/23 encounter (Appointment) with Richardo Priest, MD.     Allergies:   Codeine   Social History   Socioeconomic History   Marital  status: Single    Spouse name: Not on file   Number of children: 4   Years of education: Not on file   Highest education level: Not on file  Occupational History   Occupation: disabled  Tobacco Use   Smoking status: Every Day    Packs/day: 0.50    Years: 15.00    Total pack years: 7.50    Types: Cigarettes   Smokeless tobacco: Never   Tobacco comments:    10 per day   Vaping Use   Vaping Use: Never used  Substance and Sexual Activity   Alcohol use: Not Currently    Alcohol/week: 2.0 standard drinks of alcohol    Types: 2 Cans of beer per week    Comment: 2 beers per week    Drug use: Not Currently    Types: Marijuana   Sexual activity: Yes  Other Topics Concern   Not on file  Social History Narrative   ** Merged History Encounter **       Social Determinants of Health   Financial Resource Strain: Not on file  Food Insecurity: Not on file  Transportation Needs: Not on file  Physical Activity: Not on file  Stress: Not on file  Social Connections: Not on file     Family History: The patient's family history includes Hypertension in his mother; Lung cancer in his father; Skin cancer in his mother; Sleep apnea in his mother. There is no history of Colon cancer or Esophageal cancer. ROS:   Please see the history of present illness.    All other systems reviewed and are negative.  EKGs/Labs/Other Studies Reviewed:    The following studies were reviewed today:  Cardiac Studies & Procedures       ECHOCARDIOGRAM  ECHOCARDIOGRAM COMPLETE 06/15/2019  Narrative ECHOCARDIOGRAM REPORT    Patient Name:   Lawrence Medina Date of Exam: 06/15/2019 Medical Rec #:  [id number]  Height:       71.0 in Accession #:    [id number] Weight:       352.3 lb Date of Birth:  Nov 07, 1966  BSA:          2.68 m Patient Age:    32 years   BP:           118/70 mmHg Patient Gender: M          HR:           79 bpm. Exam Location:  Inpatient   Procedure: 2D Echo, Cardiac Doppler and Color  Doppler  Indications:    Elevated Troponin.  History:        Patient has no prior history of Echocardiogram examinations.  Sonographer:    Jonelle Sidle Dance Referring Phys: [id number] Marijean Heath   Sonographer Comments: Echo performed with patient supine and on artificial respirator  and patient is morbidly obese. IMPRESSIONS   1. The left ventricle has normal systolic function with an ejection fraction of 60-65%. The cavity size was normal. There is moderate concentric left ventricular hypertrophy. Left ventricular diastolic Doppler parameters are consistent with impaired relaxation. 2. The right ventricle has normal systolic function. The cavity was normal. There is no increase in right ventricular wall thickness. 3. The tricuspid valve is grossly normal. 4. The aortic valve is tricuspid. Aortic valve regurgitation was not assessed by color flow Doppler.  FINDINGS Left Ventricle: The left ventricle has normal systolic function, with an ejection fraction of 60-65%. The cavity size was normal. There is moderate concentric left ventricular hypertrophy. Left ventricular diastolic Doppler parameters are consistent with impaired relaxation. Normal left ventricular filling pressures  Right Ventricle: The right ventricle has normal systolic function. The cavity was normal. There is no increase in right ventricular wall thickness.  Left Atrium: Left atrial size was normal in size.  Right Atrium: Right atrial size was normal in size. Right atrial pressure is estimated at 8 mmHg.  Interatrial Septum: No atrial level shunt detected by color flow Doppler.  Pericardium: There is no evidence of pericardial effusion.  Mitral Valve: The mitral valve is normal in structure. Mitral valve regurgitation was not assessed by color flow Doppler.  Tricuspid Valve: The tricuspid valve is grossly normal. Tricuspid valve regurgitation is mild by color flow Doppler.  Aortic Valve: The aortic valve is  tricuspid Aortic valve regurgitation was not assessed by color flow Doppler.  Pulmonic Valve: The pulmonic valve was normal in structure. Pulmonic valve regurgitation was not assessed by color flow Doppler.  Venous: The inferior vena cava is normal in size with greater than 50% respiratory variability.   +--------------+--------++ LEFT VENTRICLE         +----------------+---------++ +--------------+--------++ Diastology                PLAX 2D                +----------------+---------++ +--------------+--------++ LV e' lateral:  9.36 cm/s LVIDd:        4.54 cm  +----------------+---------++ +--------------+--------++ LV E/e' lateral:6.2       LVIDs:        3.11 cm  +----------------+---------++ +--------------+--------++ LV e' medial:   8.05 cm/s LV PW:        1.32 cm  +----------------+---------++ +--------------+--------++ LV E/e' medial: 7.2       LV IVS:       1.31 cm  +----------------+---------++ +--------------+--------++ LVOT diam:    2.10 cm  +--------------+--------++ LV SV:        56 ml    +--------------+--------++ LV SV Index:  19.28    +--------------+--------++ LVOT Area:    3.46 cm +--------------+--------++                        +--------------+--------++  +---------------+----------++ RIGHT VENTRICLE           +---------------+----------++ RV Basal diam: 3.36 cm    +---------------+----------++ RV Mid diam:   3.10 cm    +---------------+----------++ RV S prime:    12.90 cm/s +---------------+----------++ TAPSE (M-mode):2.5 cm     +---------------+----------++  +---------------+-------++-----------++ LEFT ATRIUM           Index       +---------------+-------++-----------++ LA diam:       3.90 cm1.45 cm/m  +---------------+-------++-----------++ LA Vol (A2C):  81.1 ml30.24 ml/m +---------------+-------++-----------++ LA Vol (A4C):  75.8  ml28.26 ml/m +---------------+-------++-----------++  LA Biplane Vol:84.6 ml31.54 ml/m +---------------+-------++-----------++ +------------+---------++-----------++ RIGHT ATRIUM         Index       +------------+---------++-----------++ RA Area:    24.00 cm            +------------+---------++-----------++ RA Volume:  81.80 ml 30.50 ml/m +------------+---------++-----------++ +------------+-----------++ AORTIC VALVE            +------------+-----------++ LVOT Vmax:  120.00 cm/s +------------+-----------++ LVOT Vmean: 69.600 cm/s +------------+-----------++ LVOT VTI:   0.232 m     +------------+-----------++  +-------------+-------++ AORTA                +-------------+-------++ Ao Root diam:4.00 cm +-------------+-------++ Ao Asc diam: 3.60 cm +-------------+-------++  +--------------+----------++ MITRAL VALVE             +--------------+-------+ +--------------+----------++ SHUNTS                MV Area (PHT):2.34 cm   +--------------+-------+ +--------------+----------++ Systemic VTI: 0.23 m  MV PHT:       93.96 msec +--------------+-------+ +--------------+----------++ Systemic Diam:2.10 cm MV Decel Time:324 msec   +--------------+-------+ +--------------+----------++ +--------------+----------++ MV E velocity:57.70 cm/s +--------------+----------++ MV A velocity:85.90 cm/s +--------------+----------++ MV E/A ratio: 0.67       +--------------+----------++   Ena Dawley MD Electronically signed by Ena Dawley MD Signature Date/Time: 06/15/2019/11:33:51 AM    Final             EKG:  EKG ordered today and personally reviewed.  The ekg ordered today demonstrates sinus rhythm right bundle branch block 100 bpm  Recent Labs: No results found for requested labs within last 365 days.  Recent Lipid Panel    Component Value Date/Time   CHOL 207 (H) 06/14/2019  1926   TRIG 147 06/16/2019 0931   HDL 35 (L) 06/14/2019 1926   CHOLHDL 5.9 06/14/2019 1926   VLDL 29 06/14/2019 1926   LDLCALC 143 (H) 06/14/2019 1926    Physical Exam:    VS:  There were no vitals taken for this visit.    Wt Readings from Last 3 Encounters:  09/21/21 (!) 339 lb (153.8 kg)  08/02/21 (!) 336 lb (152.4 kg)  04/09/21 (!) 354 lb (160.6 kg)     GEN:  Well nourished, well developed in no acute distress HEENT: Normal NECK: No JVD; No carotid bruits LYMPHATICS: No lymphadenopathy CARDIAC: No murmur or rub incision Estimated RRR, no murmurs, rubs, gallops RESPIRATORY:  Clear to auscultation without rales, wheezing or rhonchi  ABDOMEN: Soft, non-tender, non-distended MUSCULOSKELETAL:  No edema; No deformity  SKIN: Warm and dry NEUROLOGIC:  Alert and oriented x 3 PSYCHIATRIC:  Normal affect    Signed, Shirlee More, MD  02/21/2023 4:39 PM    [facility]

## 2023-02-22 ENCOUNTER — Encounter: Payer: Self-pay | Admitting: Cardiology

## 2023-02-22 ENCOUNTER — Ambulatory Visit: Payer: 59 | Attending: Cardiology | Admitting: Cardiology

## 2023-02-22 VITALS — BP 140/100 | HR 100 | Ht 72.0 in | Wt 285.0 lb

## 2023-02-22 DIAGNOSIS — Z6841 Body Mass Index (BMI) 40.0 and over, adult: Secondary | ICD-10-CM

## 2023-02-22 DIAGNOSIS — G4733 Obstructive sleep apnea (adult) (pediatric): Secondary | ICD-10-CM | POA: Diagnosis not present

## 2023-02-22 DIAGNOSIS — I110 Hypertensive heart disease with heart failure: Secondary | ICD-10-CM | POA: Diagnosis not present

## 2023-02-22 NOTE — Patient Instructions (Signed)
Medication Instructions:  Your physician recommends that you continue on your current medications as directed. Please refer to the Current Medication list given to you today.  *If you need a refill on your cardiac medications before your next appointment, please call your pharmacy*   Lab Work: None If you have labs (blood work) drawn today and your tests are completely normal, you will receive your results only by: [place] (if you have MyChart) OR A paper copy in the mail If you have any lab test that is abnormal or we need to change your treatment, we will call you to review the results.   Testing/Procedures: Your physician has requested that you have an echocardiogram. Echocardiography is a painless test that uses sound waves to create images of your heart. It provides your doctor with information about the size and shape of your heart and how well your heart's chambers and valves are working. This procedure takes approximately one hour. There are no restrictions for this procedure. Please do NOT wear cologne, perfume, aftershave, or lotions (deodorant is allowed). Please arrive 15 minutes prior to your appointment time.    Follow-Up: At [facility], you and your health needs are our priority.  As part of our continuing mission to provide you with exceptional heart care, we have created designated Provider Care Teams.  These Care Teams include your primary Cardiologist (physician) and Advanced Practice Providers (APPs -  Physician Assistants and Nurse Practitioners) who all work together to provide you with the care you need, when you need it.  We recommend signing up for the patient portal called "MyChart".  Sign up information is provided on this After Visit Summary.  MyChart is used to connect with patients for Virtual Visits (Telemedicine).  Patients are able to view lab/test results, encounter notes, upcoming appointments, etc.  Non-urgent messages can be sent to your  provider as well.   To learn more about what you can do with MyChart, go to [email or web address].    Your next appointment:   6 month(s)  Provider:   Shirlee More, MD    Other Instructions Take every dose of blood pressure medication.  Use a pill box  Check blood pressures daily for 2 weeks and after two weeks bring the list in to the office.

## 2023-03-07 ENCOUNTER — Ambulatory Visit: Payer: 59 | Attending: Cardiology

## 2023-03-07 DIAGNOSIS — I110 Hypertensive heart disease with heart failure: Secondary | ICD-10-CM

## 2023-03-07 DIAGNOSIS — G4733 Obstructive sleep apnea (adult) (pediatric): Secondary | ICD-10-CM

## 2023-03-07 DIAGNOSIS — Z6841 Body Mass Index (BMI) 40.0 and over, adult: Secondary | ICD-10-CM | POA: Diagnosis not present

## 2023-03-07 LAB — ECHOCARDIOGRAM COMPLETE
MV M vel: 5.34 m/s
MV Peak grad: 114.1 mmHg
S' Lateral: 4.4 cm

## 2023-08-15 ENCOUNTER — Ambulatory Visit: Payer: 59 | Admitting: Orthopaedic Surgery

## 2024-04-17 LAB — LAB REPORT - SCANNED
A1c: 5.8
EGFR: 63
HM HIV Screening: NEGATIVE

## 2024-09-19 ENCOUNTER — Other Ambulatory Visit: Payer: Self-pay | Admitting: Family Medicine

## 2024-09-19 DIAGNOSIS — I719 Aortic aneurysm of unspecified site, without rupture: Secondary | ICD-10-CM

## 2024-09-24 ENCOUNTER — Ambulatory Visit
Admission: RE | Admit: 2024-09-24 | Discharge: 2024-09-24 | Disposition: A | Discharge status: Home / Self Care | Source: Ambulatory Visit | Attending: Family Medicine | Admitting: Family Medicine

## 2024-09-24 DIAGNOSIS — I719 Aortic aneurysm of unspecified site, without rupture: Secondary | ICD-10-CM

## 2024-10-04 ENCOUNTER — Ambulatory Visit: Attending: Internal Medicine | Admitting: Internal Medicine

## 2024-10-04 ENCOUNTER — Ambulatory Visit

## 2024-10-04 ENCOUNTER — Encounter: Payer: Self-pay | Admitting: Internal Medicine

## 2024-10-04 VITALS — BP 159/94 | HR 77 | Temp 97.7°F | Resp 16 | Ht 72.0 in | Wt 275.0 lb

## 2024-10-04 DIAGNOSIS — G629 Polyneuropathy, unspecified: Secondary | ICD-10-CM | POA: Insufficient documentation

## 2024-10-04 DIAGNOSIS — M79641 Pain in right hand: Secondary | ICD-10-CM

## 2024-10-04 DIAGNOSIS — M79642 Pain in left hand: Secondary | ICD-10-CM | POA: Diagnosis not present

## 2024-10-04 DIAGNOSIS — R7689 Other specified abnormal immunological findings in serum: Secondary | ICD-10-CM | POA: Diagnosis not present

## 2024-10-04 DIAGNOSIS — G6289 Other specified polyneuropathies: Secondary | ICD-10-CM | POA: Diagnosis not present

## 2024-10-04 NOTE — Progress Notes (Signed)
 Office Visit Note  Patient: Lawrence Medina             Date of Birth: [birth year]           MRN: [id number]             PCP: Loring Tanda Mae, MD Referring: Barbra Odor, NP Visit Date: 10/04/2024 Occupation: Data Unavailable  Subjective:  New Patient (Initial Visit)   Discussed the use of AI scribe software for clinical note transcription with the patient, who gave verbal consent to proceed.  History of Present Illness   Lawrence Medina is a 58 year old male who presents with burning and swelling in his hands and feet. He was referred by his primary care Odor Barbra for evaluation of symptoms including joint pain of multiple areas, intermittent joint swelling, and abnormal labs including positive ANA and SSB antibodies.  He has been experiencing burning sensations in his hands for about a year, which worsen at night and are somewhat alleviated by cold temperatures. He occasionally takes gabapentin for the burning, primarily at night, but does not take it regularly due to side effects such as feeling like his 'head is in a bucket'.  Swelling in his feet has been present for about five years, described as painful with a sensation of the skin about to burst. He was previously prescribed torsemide , which he takes intermittently due to frequent urination as a side effect. The swelling at his feet and ankles is associated with a darker coloration of the skin, which has been present for about ten years.  He has a history of degenerative disc disease at L4 and L5, which limits his ability to stand still. 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 As long as he keeps moving, he can manage, but standing still exacerbates his symptoms. Also very painful in his back rising after prolonged seated position.  He has a history of an aortic aneurysm measuring 5.7 cm, for which he is followed at [facility]. He has vascular disease with  hypertensive heart disease, aortic aneurysm, and what appears to be peripheral vascular disease with both arterial and venous component.  His current medications include gabapentin as needed for burning pain, omeprazole for heartburn, torsemide  for blood pressure and fluid management, and an unspecified medication for anxiety, which he has not yet filled.  He has a previous history of left parotiditis that was attributed to salivary gland obstruction with possible infection in 2023.  He has not had recurrent symptoms or noticeable cervical or inguinal lymphadenopathy.  No oral ulcers. No rashes, blood clots, or abnormal bleeding.     Labs reviewed ANA pos SSB 2.9 dsDNA, Sm, RNP, SSA neg RF neg CCP neg CRP 13 ESR wnl Uric acid 7.9 ASO 236 ACE 83  Activities of Daily Living:  Patient reports morning stiffness for all day.   Patient Denies nocturnal pain.  Difficulty dressing/grooming: Denies Difficulty climbing stairs: Reports Difficulty getting out of chair: Reports Difficulty using hands for taps, buttons, cutlery, and/or writing: Reports  Review of Systems  Constitutional:  Positive for fatigue.  HENT:  Positive for mouth dryness. Negative for mouth sores.   Eyes:  Positive for dryness.  Respiratory:  Positive for shortness of breath.   Cardiovascular:  Negative for chest pain and palpitations.  Gastrointestinal:  Negative for blood in stool, constipation and diarrhea.  Endocrine: Negative for increased urination.  Genitourinary:  Negative for involuntary urination.  Musculoskeletal:  Positive for joint pain, joint pain, joint swelling, myalgias, morning stiffness, muscle tenderness and myalgias. Negative for gait problem and muscle weakness.  Skin:  Positive for color change and hair loss. Negative for rash and sensitivity to sunlight.  Allergic/Immunologic: Negative for susceptible to infections.  Neurological:  Positive for headaches. Negative for dizziness.  Hematological:   Negative for swollen glands.  Psychiatric/Behavioral:  Positive for depressed mood and sleep disturbance. The patient is nervous/anxious.     PMFS History:  Patient Active Problem List   Diagnosis Date Noted   Positive ANA (antinuclear antibody) 10/04/2024   Bilateral hand pain 10/04/2024   Peripheral neuropathy 10/04/2024   Trochanteric bursitis, right hip 09/21/2021   Allergic rhinitis 06/30/2021   Arthritis 06/30/2021   Asthma 06/30/2021   Cancer (HCC) 06/30/2021   Complication of anesthesia 06/30/2021   DDD (degenerative disc disease), lumbar 06/30/2021   GERD (gastroesophageal reflux disease) 06/30/2021   Headache 06/30/2021   Myocardial infarction (HCC) 06/30/2021   PONV (postoperative nausea and vomiting) 06/30/2021   Medial meniscus, posterior horn derangement, right 04/09/2021   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   Pneumonia 10/01/2019   AKI (acute kidney injury) 06/25/2019   Hypokalemia 06/25/2019   Essential hypertension 06/25/2019   Acute respiratory failure with hypoxia (HCC)    Cardiac arrest (HCC) 06/14/2019   OSA (obstructive sleep apnea) 05/22/2019   Body mass index (BMI) of 45.0-49.9 in adult [facility]) 05/03/2019   Current every day smoker 05/03/2019   Chest tightness 05/02/2019   Dizziness 05/02/2019   Hypertensive heart disease with heart failure (HCC) 03/28/2019   Hyperlipidemia 03/28/2019   Abdominal aortic atherosclerosis 03/28/2019   AAA (abdominal aortic aneurysm) without rupture 03/28/2019   Seizures (HCC) 1982    Past Medical History:  Diagnosis Date   Allergic rhinitis    Arthritis    Asthma    Cancer (HCC)    skin   Complication of anesthesia    COPD (chronic obstructive pulmonary disease) (HCC)    DDD (degenerative disc disease), lumbar    GERD (gastroesophageal reflux disease)    Headache    High cholesterol    Hypertension    Myocardial infarction (HCC)    2020   OSA (obstructive sleep apnea)    CPAP   Pneumonia    PONV  (postoperative nausea and vomiting)    Seizures (HCC) 1982   x1 in 1982- unknown cause- none since   Shortness of breath dyspnea    with exertion    Family History  Problem Relation Age of Onset   Hypertension Mother    Sleep apnea Mother    Skin cancer Mother    Lung cancer Father    Colon cancer Neg Hx    Esophageal cancer Neg Hx    Past Surgical History:  Procedure Laterality Date   HERNIA REPAIR     KNEE ARTHROSCOPY Right 04/09/2021   Procedure: ARTHROSCOPY KNEE / MEDIAL MENISECTOMY / CHANDROPLASTY / BAKERS CYST ASPIRATION;  Surgeon: Kay Kemps, MD;  Location: [facility];  Service: Orthopedics;  Laterality: Right;   SHOULDER ARTHROSCOPY Right    SHOULDER ARTHROSCOPY WITH ROTATOR CUFF REPAIR Left 02/23/2015   Procedure: SHOULDER ARTHROSCOPY WITH ROTATOR CUFF REPAIR BIOTENDONESIS REPAIR;  Surgeon: Oneil JAYSON Herald, MD;  Location: [facility];  Service: Orthopedics;  Laterality: Left;   SUBMANDIBULAR GLAND EXCISION Left 1975   TOTAL KNEE ARTHROPLASTY Left 1986   Social History   Tobacco Use   Smoking status: Every Day    Current  packs/day: 0.50    Average packs/day: 0.5 packs/day for 15.0 years (7.5 ttl pk-yrs)    Types: Cigarettes    Passive exposure: Current   Smokeless tobacco: Never   Tobacco comments:    10 per day   Vaping Use   Vaping status: Former  Substance Use Topics   Alcohol use: Not Currently    Alcohol/week: 2.0 standard drinks of alcohol    Types: 2 Cans of beer per week    Comment: 2 beers per week    Drug use: Yes    Types: Marijuana   Social History   Social History Narrative   ** Merged History Encounter **         Immunization History  Administered Date(s) Administered   Influenza,inj,Quad PF,6+ Mos 08/27/2020   Influenza,inj,quad, With Preservative 09/05/2019   Janssen (J&J) SARS-COV-2 Vaccination 06/27/2020   Pneumococcal Polysaccharide-23 09/05/2019   Tdap 08/27/2020   Zoster Recombinant(Shingrix) 07/11/2019, 09/05/2019      Objective: Vital Signs: BP (!) 159/94 (BP Location: Right Arm, Patient Position: Sitting, Cuff Size: Normal)   Pulse 77   Temp 97.7 F (36.5 C)   Resp 16   Ht 6' (1.829 m)   Wt 275 lb (124.7 kg)   BMI 37.30 kg/m    Physical Exam Constitutional:      Appearance: He is obese.  Eyes:     Conjunctiva/sclera: Conjunctivae normal.  Cardiovascular:     Rate and Rhythm: Normal rate and regular rhythm.  Pulmonary:     Effort: Pulmonary effort is normal.     Breath sounds: Normal breath sounds.  Musculoskeletal:     Comments: Trace bilateral pitting edema  Lymphadenopathy:     Cervical: No cervical adenopathy.  Skin:    General: Skin is warm and dry.     Comments: Hyperpigmentation on both lower legs about halfway to knee, no overlying ulcers or lesions Toes are colder on the left side than right, mild violaceous blanching discoloration in both feet with decreased sensation  Neurological:     Mental Status: He is alert.  Psychiatric:        Mood and Affect: Mood normal.      Musculoskeletal Exam:  Shoulders full ROM no tenderness or swelling Elbows full ROM, pain provoked worse with full flexion, no focal tenderness to pressure or palpable swelling Wrists full ROM, pain with pressure on radial side of wrist extending to base of thumb, no palpable swelling or nodules Bilateral Heberden's nodes, probable bony widening at first [facility] joint without significant squaring Knees postsurgical changes on left side with some pain on full extension and flexion, no palpable effusions Ankles full ROM no tenderness or swelling  Investigation: No additional findings.  Imaging: XR Hand 2 View Left Result Date: 10/04/2024 X-ray left hand 2 views Radiocarpal joint space appears normal.  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  Bone spurring more at the IP joint.  Osteoarthritis at 2nd or 3rd MCP with small third digit hook osteophyte.  Severe 2nd and 3rd DIP joint space  loss with radial deviation.  No erosions or abnormal calcifications seen.  Bone mineralization appears normal. Impression Severe first CMC joint osteoarthritis also with significant degenerative change at the 2nd and 3rd fingers MCPs and DIPs.  Proximal distribution is likely explained by history of mechanical work given lack of other inflammatory joint changes  XR Hand 2 View Right Result Date: 10/04/2024 X-ray right hand 2 views Radiocarpal joint space is normal.  There is advanced  degenerative change at the first [facility] joint with moderately severe subluxation.  Moderate first MCP and IP joint bone spurring.  MCP joint changes most severe at the third MCP joint with large hook osteophyte and extensive subchondral cysts and mild phalangeal subluxation.  Some DIP joint osteoarthritis worst at the third digit.  No erosions seen.  Bone mineralization appears normal. Impression Osteoarthritis severe at the first [facility] joint also significant at the third MCP, proximal distribution likely explained with occupational history of mechanical work given no other signs of inflammatory change  US  AORTA Result Date: 09/26/2024 CLINICAL DATA:  Aortic aneurysm EXAM: ULTRASOUND OF ABDOMINAL AORTA TECHNIQUE: Ultrasound examination of the abdominal aorta and proximal common iliac arteries was performed to evaluate for aneurysm. Additional color and Doppler images of the distal aorta were obtained to document patency. COMPARISON:  02/24/2022 FINDINGS: Abdominal aortic measurements as follows: Proximal:  3.0 x 3.2 cm Mid:  5.2 x 5.7 cm Distal:  3.8 x 4.3 cm Patent: Yes, peak systolic velocity is 83 cm/s Right common iliac artery: 2.0 x 2.2 cm Left common iliac artery: 1.8 x 1.8 cm IMPRESSION: 1. Interval increase in size of mid abdominal aortic aneurysm measuring up to 5.7 cm compared to 5.2 cm on 02/24/2022. Recommend follow-up CT or MR as appropriate in 6 months and referral to or continued care with vascular specialist. (Ref.: J  Vasc Surg. 2018; 67:2-77 and J Am Coll Radiol 2013;10(10):789-794.) 2. 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 Electronically Signed   By: Aliene Lloyd M.D.   On: 09/26/2024 07:54    Recent Labs: Lab Results  Component Value Date   WBC 13.1 (H) 04/09/2021   HGB 15.0 04/09/2021   PLT 237 04/09/2021   NA 135 04/09/2021   K 4.6 04/09/2021   CL 100 04/09/2021   CO2 27 04/09/2021   GLUCOSE 226 (H) 04/09/2021   BUN 14 04/09/2021   CREATININE 1.25 (H) 04/09/2021   BILITOT 0.9 06/14/2019   ALKPHOS 103 06/14/2019   AST 73 (H) 06/14/2019   ALT 65 (H) 06/14/2019   PROT 6.0 (L) 06/14/2019   ALBUMIN 3.2 (L) 06/14/2019   CALCIUM 8.7 (L) 04/09/2021   GFRAA >60 06/25/2019    Speciality Comments: No specialty comments available.  Procedures:  No procedures performed Allergies: Codeine   Assessment / Plan:     Visit Diagnoses: Positive ANA (antinuclear antibody) - Plan: XR Hand 2 View Left, XR Hand 2 View Right Positive ANA test and widespread symptoms but not specific for autoimmune disease process.  Antibody titer is low positive for SSB but negative for specific indicators systemic connective tissue disease from what I can tell.  He has a previous history of more frank knee synovitis in the past left side after injury and right side unclear what the provocation was.  It does not describe a history of typical for gout.  There is no appreciable effusion on exam today and nothing amenable for aspiration.  Peripheral neuropathy and peripheral vascular disease are chronic with reported discolorations for the past 10 years. Borderline elevated ACE and ASO levels probably not clinically significant.  Bilateral hand pain - Plan: XR Hand 2 View Left, XR Hand 2 View Right Osteoarthritis of hands and lumbar spine with degenerative disc disease Chronic osteoarthritis in hands and lumbar spine with  significant degenerative disc disease at L4 and L5. Symptoms due to wear and tear and  mechanical stress.  Exam pain was also suggestive for possible component of de Quervain's tenosynovitis given the localized pain and radiation to pressure on the radial side of her wrist worse on his left side.  Obtained bilateral hand x-rays in clinic these show very advanced first [facility] joint osteoarthritis though I think this is explained by his occupational history. He currently takes gabapentin as needed for pain related to his neuropathy but is only using this intermittently due to drowsiness type side effects.  From chart review appears a number of neuropathic pain agents have been previously used but not on any currently.  Might also be candidate for SNRI medication if he tolerates this though could have interaction with other drugs for mood.  Not a good candidate for systemic NSAIDs given his vascular disease. Probably not a good candidate for LRTI or other surgical management with his comorbidity and circumstances, local joint injection as needed would be an option.  Peripheral neuropathy with burning and numbness of feet and hands Peripheral neuropathy with burning and numbness, primarily in feet and hands. Gabapentin causes side effects. Chronic swelling and pain in lower extremities, exacerbated by fluid retention. Torsemide  causes frequent urination, leading to non-compliance. No evidence of clots or abnormal bleeding.    Orders: Orders Placed This Encounter  Procedures   XR Hand 2 View Left   XR Hand 2 View Right   No orders of the defined types were placed in this encounter.    Follow-Up Instructions: Return if symptoms worsen or fail to improve.   Lonni LELON Ester, MD  Note - This record has been created using [employer].  Chart creation errors have been sought, but [place] not always  have been located. Such creation errors do not reflect on  the standard of medical care.
